# Patient Record
Sex: Female | Born: 1995 | Race: White | Hispanic: No | Marital: Single | Smoking: Never smoker
Health system: Southern US, Community
[De-identification: ages and names within clinical notes are randomized; demographics above are authoritative.]

## PROBLEM LIST (undated history)

## (undated) ENCOUNTER — Inpatient Hospital Stay (HOSPITAL_COMMUNITY): Payer: Self-pay

## (undated) DIAGNOSIS — S82899A Other fracture of unspecified lower leg, initial encounter for closed fracture: Secondary | ICD-10-CM

## (undated) DIAGNOSIS — E669 Obesity, unspecified: Secondary | ICD-10-CM

## (undated) DIAGNOSIS — F32A Depression, unspecified: Secondary | ICD-10-CM

## (undated) DIAGNOSIS — N83209 Unspecified ovarian cyst, unspecified side: Secondary | ICD-10-CM

## (undated) DIAGNOSIS — F329 Major depressive disorder, single episode, unspecified: Secondary | ICD-10-CM

## (undated) DIAGNOSIS — F419 Anxiety disorder, unspecified: Secondary | ICD-10-CM

## (undated) HISTORY — PX: NO PAST SURGERIES: SHX2092

---
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
## 2013-01-04 ENCOUNTER — Encounter (HOSPITAL_COMMUNITY): Payer: Self-pay | Admitting: *Deleted

## 2013-01-04 ENCOUNTER — Inpatient Hospital Stay (HOSPITAL_COMMUNITY)
Admission: AD | Admit: 2013-01-04 | Discharge: 2013-01-04 | Disposition: A | Discharge status: Home / Self Care | Payer: Medicaid Other | Source: Ambulatory Visit | Attending: Obstetrics & Gynecology | Admitting: Obstetrics & Gynecology

## 2013-01-04 ENCOUNTER — Inpatient Hospital Stay (HOSPITAL_COMMUNITY): Payer: Medicaid Other

## 2013-01-04 DIAGNOSIS — O99891 Other specified diseases and conditions complicating pregnancy: Secondary | ICD-10-CM | POA: Insufficient documentation

## 2013-01-04 DIAGNOSIS — R030 Elevated blood-pressure reading, without diagnosis of hypertension: Secondary | ICD-10-CM | POA: Insufficient documentation

## 2013-01-04 DIAGNOSIS — O209 Hemorrhage in early pregnancy, unspecified: Secondary | ICD-10-CM | POA: Insufficient documentation

## 2013-01-04 LAB — CBC
HCT: 39.7 % (ref 36.0–46.0)
Hemoglobin: 13.6 g/dL (ref 12.0–15.0)
MCH: 28 pg (ref 26.0–34.0)
MCHC: 34.3 g/dL (ref 30.0–36.0)
MCV: 81.9 fL (ref 78.0–100.0)
Platelets: 277 10*3/uL (ref 150–400)

## 2013-01-04 LAB — URINE MICROSCOPIC-ADD ON

## 2013-01-04 LAB — URINALYSIS, ROUTINE W REFLEX MICROSCOPIC
Ketones, ur: 15 mg/dL — AB
Leukocytes, UA: NEGATIVE
Nitrite: NEGATIVE
Protein, ur: NEGATIVE mg/dL
Urobilinogen, UA: 2 mg/dL — ABNORMAL HIGH (ref 0.0–1.0)

## 2013-01-04 LAB — ABO/RH: ABO/RH(D): A POS

## 2013-01-04 MED ORDER — PRENATAL PLUS 27-1 MG PO TABS: 1.0000 | ORAL_TABLET | Freq: Every day | ORAL | Status: DC

## 2013-01-04 NOTE — MAU Note (Signed)
Patient is in with c/o new onset vaginal bleeding. She is not wearing a pad or pantiliner. She states that she notices the light red bleeding after wiping. Patient denies pain. She have gotten an u/s this pregnancy.

## 2013-01-04 NOTE — MAU Provider Note (Signed)
Chief Complaint  Patient presents with  . Vaginal Bleeding    Subjective Rachel Stuart 17 y.o.  G2P1001 at [redacted]w[redacted]d by [place] at [redacted]w[redacted]d([redacted]w[redacted]d by LMP) presents with onset today of first episode of small amount pink vaginal bleeding. She has been followed here with serial visits since 12/18/2012 due to lower abdominal cramping and pain. Her serial quants have risen appropriately and she had a yolk sac on 10/1 ultrasound at 5 weeks 1 day. Still has menstrual-like crampy lower abdominal pain. Last intercourse 2 days ago.  Denies abnormal vaginal discharge or irritation. No dysuria or hematuria.  Blood type:   Problem list, past medical history, Ob/Gyn history, surgical history, family history and social history reviewed and updated as appropriate. Pertinent Medical History: [place]. No hx CHTN Pertinent Ob/Gyn History: NSVD x1 Pertinent Surgical History: none  Prescriptions prior to admission  Medication Sig Dispense Refill  . cyclobenzaprine (FLEXERIL) 10 MG tablet Take 1 tablet (10 mg total) by mouth every 8 (eight) hours as needed for muscle spasms.  30 tablet  1    Allergies  Allergen Reactions  . Amoxicillin Swelling    Lips swell     Objective   Filed Vitals:   01/04/13 1252  BP: 141/73  Pulse: 72  Temp: 98.6 F (37 C)  Resp: 18     Physical Exam General: Obese female in NAD  Abdom: soft, NT External genitalia: normal; BUS neg  Bimanual: Cervix closed, long; small amount red-brown vaginal blood; uterus anteverted, NT, 6 weeks size; adnexa nontender, no masses   Lab Results Results for orders placed during the hospital encounter of 01/04/13 (from the past 24 hour(s))  URINALYSIS, ROUTINE W REFLEX MICROSCOPIC     Status: Abnormal   Collection Time    01/04/13 12:55 PM      Result Value Range   Color, Urine AMBER (*) YELLOW   APPearance HAZY (*) CLEAR   Specific Gravity, Urine >1.030 (*) 1.005 - 1.030   pH 6.0  5.0 - 8.0   Glucose, UA NEGATIVE  NEGATIVE mg/dL   Hgb urine  dipstick LARGE (*) NEGATIVE   Bilirubin Urine SMALL (*) NEGATIVE   Ketones, ur 15 (*) NEGATIVE mg/dL   Protein, ur NEGATIVE  NEGATIVE mg/dL   Urobilinogen, UA 2.0 (*) 0.0 - 1.0 mg/dL   Nitrite NEGATIVE  NEGATIVE   Leukocytes, UA NEGATIVE  NEGATIVE  URINE MICROSCOPIC-ADD ON     Status: Abnormal   Collection Time    01/04/13 12:55 PM      Result Value Range   Squamous Epithelial / LPF FEW (*) RARE   WBC, UA 0-2  <3 WBC/hpf   RBC / HPF 21-50  <3 RBC/hpf   Bacteria, UA MANY (*) RARE  CBC     Status: None   Collection Time    01/04/13  2:04 PM      Result Value Range   WBC 7.0  4.0 - 10.5 K/uL   RBC 4.85  3.87 - 5.11 MIL/uL   Hemoglobin 13.6  12.0 - 15.0 g/dL   HCT 45.4  09.8 - 11.9 %   MCV 81.9  78.0 - 100.0 fL   MCH 28.0  26.0 - 34.0 pg   MCHC 34.3  30.0 - 36.0 g/dL   RDW 14.7  82.9 - 56.2 %   Platelets 277  150 - 400 K/uL  HCG, QUANTITATIVE, PREGNANCY     Status: Abnormal   Collection Time    01/04/13  2:04 PM  Result Value Range   hCG, Beta Chain, Quant, S 1773 (*) <5 mIU/mL     Ultrasound US Ob Transvaginal  01/04/2013   CLINICAL DATA:  Vaginal bleeding. For viability. Gestational age by LMP is 7 weeks 5 days  EXAM: TRANSVAGINAL OB ULTRASOUND  TECHNIQUE: Transvaginal ultrasound was performed for complete evaluation of the gestation as well as the maternal uterus, adnexal regions, and pelvic cul-de-sac.  COMPARISON:  Ultrasound pelvis 12/29/2012; 12/18/2012  FINDINGS: Intrauterine gestational sac: Visualized/normal in shape. The gestational sac is mobile within the endometrial canal. There is a small amount of surrounding heterogeneous material, likely blood products.  Yolk sac:  Presence  Embryo:  Present  Cardiac Activity: Present  Heart Rate: Difficult to measure due to movement of the gestational sac.  CRL:  2.4 mm   5 w 6d  Maternal uterus/adnexae: The right ovary is unremarkable. Unchanged 1.2 cm right paraovarian cyst. The left ovary is unremarkable.  IMPRESSION:  Single living intrauterine gestational with a gestational age of [redacted] weeks 6 days by crown-rump length. The gestational sac is mobile within the endometrium and there is a small amount of surrounding echogenic material, likely blood products. Recommend followup ultrasound in 10-14 days.   Electronically Signed   By: Annia Belt M.D.   On: 01/04/2013 15:38     Assessment 1. Bleeding in early pregnancy   2. Blood pressure elevated   G2P1001 IUP viable at 6w   Plan    Explained viability at present, but quant minimally increased and US showing likely blood products in endometrial cavity  Signs of SAB reviewed Discharge home on pelvic rest until clinic visit.  See AVS for pt education   Medication List    STOP taking these medications       cyclobenzaprine 10 MG tablet  Commonly known as:  FLEXERIL      TAKE these medications       prenatal vitamin w/FE, FA 27-1 MG Tabs tablet  Take 1 tablet by mouth daily.         Follow-up Information   Follow up with [facility] On 02/01/2013.   Specialty:  Obstetrics and Gynecology   Contact information:   [address] [place] [address] [phone]       POE,DEIRDRE 01/04/2013 2:43 PM

## 2013-01-05 ENCOUNTER — Inpatient Hospital Stay (HOSPITAL_COMMUNITY)
Admission: AD | Admit: 2013-01-05 | Discharge: 2013-01-05 | Disposition: A | Discharge status: Home / Self Care | Payer: Medicaid Other | Source: Ambulatory Visit | Attending: Obstetrics and Gynecology | Admitting: Obstetrics and Gynecology

## 2013-01-05 ENCOUNTER — Inpatient Hospital Stay (HOSPITAL_COMMUNITY): Payer: Medicaid Other

## 2013-01-05 ENCOUNTER — Encounter (HOSPITAL_COMMUNITY): Payer: Self-pay | Admitting: *Deleted

## 2013-01-05 DIAGNOSIS — O039 Complete or unspecified spontaneous abortion without complication: Secondary | ICD-10-CM

## 2013-01-05 LAB — CBC
HCT: 37.7 % (ref 36.0–46.0)
Hemoglobin: 12.9 g/dL (ref 12.0–15.0)
MCHC: 34.2 g/dL (ref 30.0–36.0)
RDW: 13.2 % (ref 11.5–15.5)

## 2013-01-05 MED ORDER — IBUPROFEN 800 MG PO TABS: 800.0000 mg | ORAL_TABLET | Freq: Three times a day (TID) | ORAL | Status: DC

## 2013-01-05 NOTE — MAU Provider Note (Addendum)
Chief Complaint: Vaginal Bleeding  First Provider Initiated Contact with Patient 01/05/13 0747.     SUBJECTIVE HPI: Rachel Stuart is a 17 y.o. G2P1001 at [redacted]w[redacted]d by [place] who presents with moderate to heavy vaginal bleeding and possibly passing a clot this morning. Unsure if it was a GS. Was seen in MAU 5 times in past month for pregnancy issues. Live 5.6 week IUP seen in [place] yesterday. GC/CT, Wet prep neg 12/18/2012. "The gestational sac is mobile within the endometrium and there is a small amount of surrounding echogenic material, likely blood products."  Past Medical History  Diagnosis Date  . Ovarian cyst   . Depression   . Obesity   . Ankle fracture    OB History  Gravida Para Term Preterm AB SAB TAB Ectopic Multiple Living  2 1 1       1     # Outcome Date GA Lbr Len/2nd Weight Sex Delivery Anes PTL Lv  2 CUR           1 TRM 02/26/10    F SVD None       Past Surgical History  Procedure Laterality Date  . No past surgeries     History   Social History  . Marital Status: Single    Spouse Name: N/A    Number of Children: N/A  . Years of Education: N/A   Occupational History  . Not on file.   Social History Main Topics  . Smoking status: Never Smoker   . Smokeless tobacco: Not on file  . Alcohol Use: No  . Drug Use: No  . Sexual Activity: Yes    Birth Control/ Protection: None   Other Topics Concern  . Not on file   Social History Narrative  . No narrative on file   No current facility-administered medications on file prior to encounter.   No current outpatient prescriptions on file prior to encounter.   Allergies  Allergen Reactions  . Amoxicillin Swelling    Lips swell    ROS: Pertinent items in HPI. Neg for fever, chill, abd pain.   OBJECTIVE Blood pressure 118/73, pulse 79, temperature 98.6 F (37 C), temperature source Oral, resp. rate 18, height 5\' 3"  (1.6 m), weight 238 lb 3.2 oz (108.047 kg), last menstrual period 11/11/2012. GENERAL:  Well-developed, well-nourished female in no acute distress.  HEENT: Normocephalic HEART: normal rate RESP: normal effort ABDOMEN: Soft, non-tender EXTREMITIES: Nontender, no edema NEURO: Alert and oriented SPECULUM EXAM: NEFG, small amount of dark red blood noted, cervix clean. Possible bartholin's vs vaginal wall cyst visualized and palpated on left vaginal wall at introitus. NT. No erythema or drainage.  BIMANUAL: cervix FT dilated; uterus normal size, no adnexal tenderness or masses. No CMT.   MAU COURSE [place], CBC ordered. Care of pt turned over to Jannifer Rodney, NP at 0815 am.   Carolynn Serve, NP 01/05/2013  11:00 AM    Jannifer Rodney, NP  Results for orders placed during the hospital encounter of 01/05/13 (from the past 24 hour(s))  CBC     Status: None   Collection Time    01/05/13  8:15 AM      Result Value Range   WBC 7.1  4.0 - 10.5 K/uL   RBC 4.55  3.87 - 5.11 MIL/uL   Hemoglobin 12.9  12.0 - 15.0 g/dL   HCT 19.1  47.8 - 29.5 %   MCV 82.9  78.0 - 100.0 fL   MCH 28.4  26.0 -  34.0 pg   MCHC 34.2  30.0 - 36.0 g/dL   RDW 81.1  91.4 - 78.2 %   Platelets 277  150 - 400 K/uL   Beta HCG= 1773 Blood type A positive     US Ob Transvaginal  01/05/2013   CLINICAL DATA:  Vaginal bleeding  EXAM: TRANSVAGINAL OB ULTRASOUND  TECHNIQUE: Transvaginal ultrasound was performed for complete evaluation of the gestation as well as the maternal uterus, adnexal regions, and pelvic cul-de-sac.  COMPARISON:  01/04/2013  FINDINGS: Intrauterine gestational sac: No longer visualized. Endometrial thickness 1.5 cm maximally at the lower uterine segment endometrial canal. Area not evaluated with color Doppler by the technologist.  Yolk sac:  No longer identified  Embryo:  Not identified  Maternal uterus/adnexae: Right ovary not visualized. Normal left ovary.  IMPRESSION: Apparent interval missed abortion. Mildly prominent endometrium measuring 1.5 cm with internal debris or clot.    Electronically Signed   By: Christiana Pellant M.D.   On: 01/05/2013 10:31   US Ob Transvaginal  01/04/2013   CLINICAL DATA:  Vaginal bleeding. For viability. Gestational age by LMP is 7 weeks 5 days  EXAM: TRANSVAGINAL OB ULTRASOUND  TECHNIQUE: Transvaginal ultrasound was performed for complete evaluation of the gestation as well as the maternal uterus, adnexal regions, and pelvic cul-de-sac.  COMPARISON:  Ultrasound pelvis 12/29/2012; 12/18/2012  FINDINGS: Intrauterine gestational sac: Visualized/normal in shape. The gestational sac is mobile within the endometrial canal. There is a small amount of surrounding heterogeneous material, likely blood products.  Yolk sac:  Presence  Embryo:  Present  Cardiac Activity: Present  Heart Rate: Difficult to measure due to movement of the gestational sac.  CRL:  2.4 mm   5 w 6d  Maternal uterus/adnexae: The right ovary is unremarkable. Unchanged 1.2 cm right paraovarian cyst. The left ovary is unremarkable.  IMPRESSION: Single living intrauterine gestational with a gestational age of [redacted] weeks 6 days by crown-rump length. The gestational sac is mobile within the endometrium and there is a small amount of surrounding echogenic material, likely blood products. Recommend followup ultrasound in 10-14 days.   Electronically Signed   By: Annia Belt M.D.   On: 01/04/2013 15:38   A: SAB P: Miscarriage procedures to include nothing in vagina. Advised to not become pregnant for 6-12 months Motrin 800 mg TID prn pain Return in 48 hours for Quant

## 2013-01-05 NOTE — MAU Provider Note (Signed)
Attestation of Attending Supervision of Advanced Practitioner (CNM/NP): Evaluation and management procedures were performed by the Advanced Practitioner under my supervision and collaboration.  I have reviewed the Advanced Practitioner's note and chart, and I agree with the management and plan.  [name] 01/05/2013 4:04 PM   

## 2013-01-05 NOTE — MAU Provider Note (Signed)
Attestation of Attending Supervision of Advanced Practitioner (CNM/NP): Evaluation and management procedures were performed by the Advanced Practitioner under my supervision and collaboration.  I have reviewed the Advanced Practitioner's note and chart, and I agree with the management and plan.  Cordaryl Decelles 01/05/2013 9:59 AM

## 2013-01-05 NOTE — MAU Note (Signed)
Pt presents with complaints of going to the bathroom this morning and she noticed some bleeding and she says that she is still bleeding like a period

## 2013-01-30 ENCOUNTER — Emergency Department (HOSPITAL_COMMUNITY): Payer: Medicaid Other

## 2013-01-30 ENCOUNTER — Encounter (HOSPITAL_COMMUNITY): Payer: Self-pay | Admitting: Emergency Medicine

## 2013-01-30 ENCOUNTER — Emergency Department (HOSPITAL_COMMUNITY)
Admission: EM | Admit: 2013-01-30 | Discharge: 2013-01-30 | Disposition: A | Discharge status: Home / Self Care | Payer: Medicaid Other | Attending: Emergency Medicine | Admitting: Emergency Medicine

## 2013-01-30 DIAGNOSIS — R0789 Other chest pain: Secondary | ICD-10-CM | POA: Insufficient documentation

## 2013-01-30 DIAGNOSIS — Z8659 Personal history of other mental and behavioral disorders: Secondary | ICD-10-CM | POA: Insufficient documentation

## 2013-01-30 DIAGNOSIS — E669 Obesity, unspecified: Secondary | ICD-10-CM | POA: Insufficient documentation

## 2013-01-30 DIAGNOSIS — Z8742 Personal history of other diseases of the female genital tract: Secondary | ICD-10-CM | POA: Insufficient documentation

## 2013-01-30 DIAGNOSIS — Z8781 Personal history of (healed) traumatic fracture: Secondary | ICD-10-CM | POA: Insufficient documentation

## 2013-01-30 LAB — PRO B NATRIURETIC PEPTIDE: Pro B Natriuretic peptide (BNP): <5 pg/mL (ref 0–125)

## 2013-01-30 LAB — BASIC METABOLIC PANEL
BUN: 11 mg/dL (ref 6–23)
Calcium: 9.5 mg/dL (ref 8.4–10.5)
Chloride: 102 mEq/L (ref 96–112)
Creatinine, Ser: 0.61 mg/dL (ref 0.50–1.10)
GFR calc Af Amer: >90 mL/min (ref >90)
GFR calc non Af Amer: >90 mL/min (ref >90)
Glucose, Bld: 96 mg/dL (ref 70–99)

## 2013-01-30 LAB — D-DIMER, QUANTITATIVE: D-Dimer, Quant: 0.34 ug/mL-FEU (ref 0.00–0.48)

## 2013-01-30 LAB — CBC
HCT: 39.7 % (ref 36.0–46.0)
MCH: 29 pg (ref 26.0–34.0)
MCHC: 35.5 g/dL (ref 30.0–36.0)
MCV: 81.5 fL (ref 78.0–100.0)
Platelets: 347 10*3/uL (ref 150–400)
RDW: 12.6 % (ref 11.5–15.5)

## 2013-01-30 MED ORDER — KETOROLAC TROMETHAMINE 60 MG/2ML IM SOLN
60.0000 mg | Freq: Once | INTRAMUSCULAR | Status: AC
  Administered 2013-01-30: 60 mg via INTRAMUSCULAR
  Filled 2013-01-30: qty 2

## 2013-01-30 MED ORDER — IBUPROFEN 600 MG PO TABS: 600.0000 mg | ORAL_TABLET | Freq: Four times a day (QID) | ORAL | Status: DC | PRN

## 2013-01-30 NOTE — ED Notes (Signed)
Dr. Docherty in to see patient. 

## 2013-01-30 NOTE — ED Notes (Signed)
Pt c/o mid chest pain and left sided ribcage pain since last night and states she feels like she can not get a breath. Pain exacerbated by nothing, pain relived by nothing. Denies n/v, diaphoresis. Denies cough fever chills, and body aches.

## 2013-01-30 NOTE — ED Notes (Signed)
Dr. Micheline Maze at bedside to assess patient.

## 2013-01-30 NOTE — ED Provider Notes (Signed)
CSN: [id number]     Arrival date & time 01/30/13  1958 History   First MD Initiated Contact with Patient 01/30/13 2138     Chief Complaint  Patient presents with  . Chest Pain   (Consider location/radiation/quality/duration/timing/severity/associated sxs/prior Treatment) Patient is a 17 y.o. female presenting with chest pain. The history is provided by the patient. No language interpreter was used.  Chest Pain Pain location:  L chest Pain quality: sharp   Pain radiates to:  L arm Pain radiates to the back: no   Pain severity:  Moderate Onset quality:  Sudden Duration:  1 day Timing:  Constant Progression:  Unchanged Chronicity:  New Context: at rest   Relieved by:  Nothing Worsened by:  Deep breathing Ineffective treatments:  None tried Associated symptoms: no abdominal pain, no anxiety, no back pain, no cough, no diaphoresis, no dizziness, no fatigue, no fever, no headache, no lower extremity edema, no nausea, no numbness, no palpitations, no shortness of breath, not vomiting and no weakness   Risk factors: obesity   Risk factors: no aortic disease, no birth control, no coronary artery disease, no diabetes mellitus, no high cholesterol, no hypertension, no immobilization, not female, no prior DVT/PE, no smoking and no surgery     Past Medical History  Diagnosis Date  . Ovarian cyst   . Depression   . Obesity   . Ankle fracture    Past Surgical History  Procedure Laterality Date  . No past surgeries     Family History  Problem Relation Age of Onset  . Hypertension Maternal Grandmother   . Diabetes Paternal Grandfather    History  Substance Use Topics  . Smoking status: Never Smoker   . Smokeless tobacco: Not on file  . Alcohol Use: No   OB History   Grav Para Term Preterm Abortions TAB SAB Ect Mult Living   2 1 1       1      Review of Systems  Constitutional: Negative for fever, chills, diaphoresis, activity change, appetite change and fatigue.  HENT: Negative  for congestion, facial swelling, rhinorrhea and sore throat.   Eyes: Negative for photophobia and discharge.  Respiratory: Negative for cough, chest tightness and shortness of breath.   Cardiovascular: Positive for chest pain. Negative for palpitations and leg swelling.  Gastrointestinal: Negative for nausea, vomiting, abdominal pain and diarrhea.  Endocrine: Negative for polydipsia and polyuria.  Genitourinary: Negative for dysuria, frequency, difficulty urinating and pelvic pain.  Musculoskeletal: Negative for arthralgias, back pain, neck pain and neck stiffness.  Skin: Negative for color change and wound.  Allergic/Immunologic: Negative for immunocompromised state.  Neurological: Negative for dizziness, facial asymmetry, weakness, numbness and headaches.  Hematological: Does not bruise/bleed easily.  Psychiatric/Behavioral: Negative for confusion and agitation.    Allergies  Amoxicillin  Home Medications   Current Outpatient Rx  Name  Route  Sig  Dispense  Refill  . ibuprofen (ADVIL,MOTRIN) 800 MG tablet   Oral   Take 1 tablet (800 mg total) by mouth 3 (three) times daily.   30 tablet   0    BP 131/72  Pulse 83  Temp(Src) 99.3 F (37.4 C) (Oral)  Resp 16  Wt 232 lb 3.2 oz (105.325 kg)  SpO2 100%  LMP 01/05/2013  Breastfeeding? Unknown Physical Exam  Constitutional: She is oriented to person, place, and time. She appears well-developed and well-nourished. No distress.  HENT:  Head: Normocephalic and atraumatic.  Mouth/Throat: No oropharyngeal exudate.  Eyes: Pupils are  equal, round, and reactive to light.  Neck: Normal range of motion. Neck supple.  Cardiovascular: Normal rate, regular rhythm and normal heart sounds.  Exam reveals no gallop and no friction rub.   No murmur heard. Pulmonary/Chest: Effort normal and breath sounds normal. No respiratory distress. She has no wheezes. She has no rales.    Abdominal: Soft. Bowel sounds are normal. She exhibits no  distension and no mass. There is no tenderness. There is no rebound and no guarding.  Musculoskeletal: Normal range of motion. She exhibits no edema and no tenderness.  Neurological: She is alert and oriented to person, place, and time.  Skin: Skin is warm and dry.  Psychiatric: She has a normal mood and affect.    ED Course  Procedures (including critical care time) Labs Review Labs Reviewed  CBC  PRO B NATRIURETIC PEPTIDE  BASIC METABOLIC PANEL  D-DIMER, QUANTITATIVE  POCT I-STAT TROPONIN I   Imaging Review No results found.  EKG Interpretation   None       MDM  No diagnosis found. Pt is a 17 y.o. female with Pmhx as above who presents with, sharp, L sided CP for about 24 hours.  Worse w/ deep inspirations, otherwise no aggravating ro alleviating symptoms.  PERC negative.  +reproducible CP.  Cardiopulm exam otherwise benign. No LE edema. EKG nml, trop negative. D-dimer not elevated. CXR unremarkable.  Doubt ACS, pna, PE, ptx.  Pain most likely musculoskeletal.  Will d/c home w/ trial of NSAIDs.  Return precautions given for new or worsening symptoms including worsening pain, fever, SOB, leg swelling.           Shanna Cisco, MD 01/31/13 [phone]

## 2013-02-01 ENCOUNTER — Encounter: Payer: Medicaid Other | Admitting: Family Medicine

## 2013-04-15 ENCOUNTER — Encounter (HOSPITAL_COMMUNITY): Payer: Self-pay | Admitting: Emergency Medicine

## 2013-04-15 ENCOUNTER — Emergency Department (HOSPITAL_COMMUNITY)
Admission: EM | Admit: 2013-04-15 | Discharge: 2013-04-16 | Discharge status: Against Medical Advice | Payer: Medicaid Other | Attending: Emergency Medicine | Admitting: Emergency Medicine

## 2013-04-15 DIAGNOSIS — M79609 Pain in unspecified limb: Secondary | ICD-10-CM | POA: Insufficient documentation

## 2013-04-15 NOTE — ED Notes (Signed)
No answer in w/r, unable to find.  

## 2013-04-15 NOTE — ED Notes (Signed)
The pt has bed busg at her house and was spraying for them when she sprayed her rt hand that has cuts on it.  She has pain and sl swelling now

## 2013-04-15 NOTE — ED Notes (Signed)
Unable to locate pt in triage waiting area.

## 2013-04-16 NOTE — ED Notes (Signed)
No answer x3

## 2014-01-30 ENCOUNTER — Encounter (HOSPITAL_COMMUNITY): Payer: Self-pay | Admitting: Emergency Medicine

## 2014-03-31 NOTE — L&D Delivery Note (Cosign Needed)
Delivery Note Pt received Pitocin after getting an epidural, and she progressed to complete dilation. After pushing w/ approx 5 ctx at 2:15 AM a viable female was delivered via Vaginal, Spontaneous Delivery (Presentation: Right Occiput Anterior).  APGAR: 9, 9; weight  .   Placenta status: Intact, Spontaneous.  Cord: 3 vessels   Anesthesia: Epidural  Episiotomy: None Lacerations: None Est. Blood Loss (mL): 303  Mom to postpartum.  Baby to Couplet care / Skin to Skin.  Cam HaiSHAW, KIMBERLY CNM 02/15/2015, 2:39 AM

## 2014-04-22 ENCOUNTER — Encounter (HOSPITAL_COMMUNITY): Payer: Self-pay | Admitting: *Deleted

## 2014-04-22 ENCOUNTER — Emergency Department (HOSPITAL_COMMUNITY)
Admission: EM | Admit: 2014-04-22 | Discharge: 2014-04-22 | Disposition: A | Discharge status: Home / Self Care | Payer: Medicaid Other | Attending: Emergency Medicine | Admitting: Emergency Medicine

## 2014-04-22 ENCOUNTER — Emergency Department (HOSPITAL_COMMUNITY): Payer: Medicaid Other

## 2014-04-22 DIAGNOSIS — Y9389 Activity, other specified: Secondary | ICD-10-CM | POA: Diagnosis not present

## 2014-04-22 DIAGNOSIS — Z880 Allergy status to penicillin: Secondary | ICD-10-CM | POA: Diagnosis not present

## 2014-04-22 DIAGNOSIS — Z8781 Personal history of (healed) traumatic fracture: Secondary | ICD-10-CM | POA: Insufficient documentation

## 2014-04-22 DIAGNOSIS — E669 Obesity, unspecified: Secondary | ICD-10-CM | POA: Insufficient documentation

## 2014-04-22 DIAGNOSIS — M79645 Pain in left finger(s): Secondary | ICD-10-CM

## 2014-04-22 DIAGNOSIS — W1839XA Other fall on same level, initial encounter: Secondary | ICD-10-CM | POA: Insufficient documentation

## 2014-04-22 DIAGNOSIS — Y9289 Other specified places as the place of occurrence of the external cause: Secondary | ICD-10-CM | POA: Diagnosis not present

## 2014-04-22 DIAGNOSIS — Z87448 Personal history of other diseases of urinary system: Secondary | ICD-10-CM | POA: Insufficient documentation

## 2014-04-22 DIAGNOSIS — Z8659 Personal history of other mental and behavioral disorders: Secondary | ICD-10-CM | POA: Insufficient documentation

## 2014-04-22 DIAGNOSIS — S6992XA Unspecified injury of left wrist, hand and finger(s), initial encounter: Secondary | ICD-10-CM | POA: Insufficient documentation

## 2014-04-22 DIAGNOSIS — Y998 Other external cause status: Secondary | ICD-10-CM | POA: Insufficient documentation

## 2014-04-22 NOTE — ED Notes (Signed)
Velcro Thumb Spica splint applied to left hand/wrist. Pt able to wiggle fingers, palpable radial pulse, cap refill less than 3 sec. States is comfortable.

## 2014-04-22 NOTE — ED Provider Notes (Signed)
CSN: [id number]     Arrival date & time 04/22/14  1221 History  This chart was scribed for non-physician practitioner, Santiago Glad, PA-C working with Flint Melter, MD by Luisa Dago, ED scribe. This patient was seen in room TR09C/TR09C and the patient's care was started at 12:42 PM.    Chief Complaint  Patient presents with  . Finger Injury    LT thumb   The history is provided by medical records and the patient. No language interpreter was used.   HPI Comments: Rachel Stuart is a 19 y.o. female who presents to the Emergency Department complaining of of a left thumb injury that occurred yesterday while playing with her child.  She states that her thumb bent backwards while playing on the floor. Pain is made worse by the movement of the finger. Positive paraesthesia to the left thumb, which she states is gradually moving down her hand. Reports taking 800 mg Ibuprofen with mild relief. Reports mild associated swelling of the thumb.  Denies pain of the wrist or any other fingers.   Past Medical History  Diagnosis Date  . Ovarian cyst   . Depression   . Obesity   . Ankle fracture    Past Surgical History  Procedure Laterality Date  . No past surgeries     Family History  Problem Relation Age of Onset  . Hypertension Maternal Grandmother   . Diabetes Paternal Grandfather    History  Substance Use Topics  . Smoking status: Never Smoker   . Smokeless tobacco: Not on file  . Alcohol Use: No   OB History    Gravida Para Term Preterm AB TAB SAB Ectopic Multiple Living   Review of Systems  Constitutional: Negative for fever.  Musculoskeletal: Positive for joint swelling and arthralgias. Negative for myalgias.  Neurological: Negative for weakness and numbness.   Allergies  Amoxicillin  Home Medications   Prior to Admission medications   Not on File   Triage vitals: BP 122/74 mmHg  Temp(Src) 98 F (36.7 C) (Oral)  Resp 18  SpO2 100%  LMP  03/31/2014  Physical Exam  Constitutional: She is oriented to person, place, and time. She appears well-developed and well-nourished. No distress.  HENT:  Head: Normocephalic and atraumatic.  Eyes: Conjunctivae and EOM are normal.  Neck: Normal range of motion. Neck supple. No tracheal deviation present.  Cardiovascular: Normal rate and regular rhythm.  Exam reveals no gallop and no friction rub.   No murmur heard. Pulses:      Radial pulses are 2+ on the left side.  Pulmonary/Chest: Effort normal and breath sounds normal. No respiratory distress. She has no wheezes. She has no rales. She exhibits no tenderness.  Musculoskeletal: Normal range of motion.  Tenderness to palpation of the left thumb MCP and the proximal left thumb phalange.  ROM of the left thumb limited secondary to pain. No erythema, edema, or bruising noted. No snuff box tenderness. Full ROM of the left wrist without pain.  No bony tenderness of the left wrist.   Neurological: She is alert and oriented to person, place, and time.  Distal sensation of left thumb is intact  Skin: Skin is warm and dry.  Psychiatric: She has a normal mood and affect. Her behavior is normal.  Nursing note and vitals reviewed.   ED Course  Procedures (including critical care time)  DIAGNOSTIC STUDIES: Oxygen Saturation is 100% on  RA, normal by my interpretation.    COORDINATION OF CARE: 12:49 PM- Pt advised of plan for treatment and pt agrees.  Labs Review Labs Reviewed - No data to display  Imaging Review Dg Hand Complete Left  04/22/2014   CLINICAL DATA:  Thumb pain and swelling following injury yesterday. Initial encounter.  EXAM: LEFT HAND - COMPLETE 3+ VIEW  COMPARISON:  None.  FINDINGS: The mineralization and alignment are normal. There is no evidence of acute fracture or dislocation. The joint spaces are maintained. There is no focal soft tissue swelling.  IMPRESSION: No acute osseous findings.   Electronically Signed   By: Roxy HorsemanBill   Veazey M.D.   On: 04/22/2014 13:21     EKG Interpretation None      MDM   Final diagnoses:  None   Patient presents today with hand of her thumb after bending it backwards while playing with her child this morning.  Xray negative.  Neurovascularly intact.  Patient given velcro thumb spica.  Stable for discharge.  Return precautions given.   Santiago GladHeather Zissy Hamlett, PA-C 04/22/14 1545  Flint MelterElliott L Wentz, MD 04/22/14 [phone]

## 2014-04-22 NOTE — ED Notes (Signed)
Pt reports falling on LT thumb yesterday while playing with child, Pt now reports pain to LT thumb that radiates int forearm.

## 2014-06-05 ENCOUNTER — Emergency Department (HOSPITAL_COMMUNITY)
Admission: EM | Admit: 2014-06-05 | Discharge: 2014-06-05 | Disposition: A | Discharge status: Home / Self Care | Payer: Medicaid Other | Attending: Emergency Medicine | Admitting: Emergency Medicine

## 2014-06-05 ENCOUNTER — Encounter (HOSPITAL_COMMUNITY): Payer: Self-pay | Admitting: Emergency Medicine

## 2014-06-05 DIAGNOSIS — Z880 Allergy status to penicillin: Secondary | ICD-10-CM | POA: Diagnosis not present

## 2014-06-05 DIAGNOSIS — E669 Obesity, unspecified: Secondary | ICD-10-CM | POA: Diagnosis not present

## 2014-06-05 DIAGNOSIS — Z8781 Personal history of (healed) traumatic fracture: Secondary | ICD-10-CM | POA: Insufficient documentation

## 2014-06-05 DIAGNOSIS — Z8659 Personal history of other mental and behavioral disorders: Secondary | ICD-10-CM | POA: Insufficient documentation

## 2014-06-05 DIAGNOSIS — Z8742 Personal history of other diseases of the female genital tract: Secondary | ICD-10-CM | POA: Diagnosis not present

## 2014-06-05 DIAGNOSIS — K029 Dental caries, unspecified: Secondary | ICD-10-CM | POA: Diagnosis not present

## 2014-06-05 DIAGNOSIS — K088 Other specified disorders of teeth and supporting structures: Secondary | ICD-10-CM | POA: Insufficient documentation

## 2014-06-05 DIAGNOSIS — K0889 Other specified disorders of teeth and supporting structures: Secondary | ICD-10-CM

## 2014-06-05 MED ORDER — IBUPROFEN 800 MG PO TABS: 800.0000 mg | ORAL_TABLET | Freq: Three times a day (TID) | ORAL | Status: DC

## 2014-06-05 MED ORDER — CEPHALEXIN 500 MG PO CAPS: 500.0000 mg | ORAL_CAPSULE | Freq: Four times a day (QID) | ORAL | Status: DC

## 2014-06-05 NOTE — ED Notes (Signed)
Pt c/o left sided dental pain x several days up into ear

## 2014-06-05 NOTE — Discharge Instructions (Signed)
Dental Pain °A tooth ache may be caused by cavities (tooth decay). Cavities expose the nerve of the tooth to air and hot or cold temperatures. It may come from an infection or abscess (also called a boil or furuncle) around your tooth. It is also often caused by dental caries (tooth decay). This causes the pain you are having. °DIAGNOSIS  °Your caregiver can diagnose this problem by exam. °TREATMENT  °· If caused by an infection, it may be treated with medications which kill germs (antibiotics) and pain medications as prescribed by your caregiver. Take medications as directed. °· Only take over-the-counter or prescription medicines for pain, discomfort, or fever as directed by your caregiver. °· Whether the tooth ache today is caused by infection or dental disease, you should see your dentist as soon as possible for further care. °SEEK MEDICAL CARE IF: °The exam and treatment you received today has been provided on an emergency basis only. This is not a substitute for complete medical or dental care. If your problem worsens or new problems (symptoms) appear, and you are unable to meet with your dentist, call or return to this location. °SEEK IMMEDIATE MEDICAL CARE IF:  °· You have a fever. °· You develop redness and swelling of your face, jaw, or neck. °· You are unable to open your mouth. °· You have severe pain uncontrolled by pain medicine. °MAKE SURE YOU:  °· Understand these instructions. °· Will watch your condition. °· Will get help right away if you are not doing well or get worse. °Document Released: 03/17/2005 Document Revised: 06/09/2011 Document Reviewed: 11/03/2007 °ExitCare® Patient Information ©2015 [employer]. This information is not intended to replace advice given to you by your health care provider. Make sure you discuss any questions you have with your health care provider. ° °Emergency Department Resource Guide °1) Find a Doctor and Pay Out of Pocket °Although you won't have to find out who  is covered by your insurance plan, it is a good idea to ask around and get recommendations. You will then need to call the office and see if the doctor you have chosen will accept you as a new patient and what types of options they offer for patients who are self-pay. Some doctors offer discounts or will set up payment plans for their patients who do not have insurance, but you will need to ask so you aren't surprised when you get to your appointment. ° °2) Contact Your Local Health Department °Not all health departments have doctors that can see patients for sick visits, but many do, so it is worth a call to see if yours does. If you don't know where your local health department is, you can check in your phone book. The CDC also has a tool to help you locate your state's health department, and many state websites also have listings of all of their local health departments. ° °3) Find a Walk-in Clinic °If your illness is not likely to be very severe or complicated, you may want to try a walk in clinic. These are popping up all over the country in pharmacies, drugstores, and shopping centers. They're usually staffed by nurse practitioners or physician assistants that have been trained to treat common illnesses and complaints. They're usually fairly quick and inexpensive. However, if you have serious medical issues or chronic medical problems, these are probably not your best option. ° °No Primary Care Doctor: °- Call [facility] at  832-8000 - they can help you locate a primary   care doctor that  accepts your insurance, provides certain services, etc. °- Physician Referral Service- 1-800-533-3463 ° °Chronic Pain Problems: °Organization         Address  Phone   Notes  °[facility]  (336) 297-2271 Patients need to be referred by their primary care doctor.  ° °Medication Assistance: °Organization         Address  Phone   Notes  °[place] [facility] [address]  [address] °[place], [place] [address] (336) 641-8030 --Must be a resident of [place] °-- Must have NO insurance coverage whatsoever (no Medicaid/ Medicare, etc.) °-- The pt. MUST have a primary care doctor that directs their care regularly and follows them in the community °  °[facility]  (866) 331-1348   °[employer]  (888) 892-1162   ° °Agencies that provide inexpensive medical care: °Organization         Address  Phone   Notes  °[facility]  (336) 832-8035   °[facility]    (336) 832-7272   °[facility] [address] °[place], [place] [address] (336) 832-4777   °[facility] [address], °[place] (336) 271-4999   °[facility]    (336) 373-0678   °[facility]    (336) 272-1050   °[facility] ° [address], [place] Phone:  (336) 832-4444, Fax:  (336) 832-4440 Hours of Operation:  9 am - 6 pm, M-F.  Also accepts Medicaid/Medicare and self-pay.  °[facility] ° [address], [place] Phone: (336) 832-3150, Fax: (336) 832-3151. Hours of Operation:  8:30 am - 5:30 pm, M-F.  Also accepts Medicaid and self-pay.  °[facility] [place] [address], [place] Phone: (336) 878-6027   °[facility] [address], [place], [place] (336)723-1848, Ext. 123 Mondays & Thursdays: 7-9 AM.  First 15 patients are seen on a first come, first serve basis. °  ° °Medicaid-accepting [place] Providers: ° °Organization         Address  Phone   Notes  °[facility] [address], [place] (336) 641-2100 Also accepts self-pay patients.  °[facility] [address], [place] ° (336) 856-9996   °[facility] [address], [place] (336) 288-8857   °[facility] [address] [place] [address], [place] (336) 299-7000   °Veita Bland [address], [place]  ° (336) 373-1557 Only accepts [place] Access Medicaid patients after they have their name applied to their card.  ° °Self-Pay (no insurance) in [place]: ° °Organization         Address  Phone   Notes  °Sickle Cell Patients, [facility] [address], [place] (336) 832-1970   °[facility] [address], [place] (336) 832-4400   °[facility] ° [address], [place] (336) 992-4800   °[facility]/Dr. Osei-Bonsu ° [address] [place] [address], [place] or [address], [place] (336) 841-8500 Phone number for both [place] and [place] locations is the same.  °[facility] [address], [place] (336) 299-0000   °[facility] [address] [place] [address],  or [address] (336) 852-7530 °(336) 878-2260   °  [facility] [address], [place] (336) 350-1642, phone; (336) 294-5005, fax Sees patients 1st and 3rd Saturday of every month.  Must not qualify for public or private insurance (i.e. Medicaid, Medicare, [place] Health Choice, Veterans' Benefits) • Household income should be no more than 200% of the poverty level •The clinic cannot treat you if you are pregnant or think you are pregnant • Sexually transmitted diseases are not treated at the clinic.  ° ° °Dental Care: °Organization         Address  Phone  Notes  °[facility] [address], [place] (336) 641-6152 Accepts children up to age 21 who are enrolled in Medicaid or [place] Health Choice; pregnant women with a Medicaid card; and children who have applied for Medicaid or [place] Health Choice, but were declined, whose parents can pay a reduced fee at time of service.  °[facility] [place]  [address], [place] (336) 641-7733 Accepts children up to age 21 who are  enrolled in Medicaid or [place] Health Choice; pregnant women with a Medicaid card; and children who have applied for Medicaid or [place] Health Choice, but were declined, whose parents can pay a reduced fee at time of service.  °[facility] ° [address], [place] (336) 641-4533 Patients are seen by appointment only. Walk-ins are not accepted. [facility] will see patients 18 years of age and older. °Monday - Tuesday (8am-5pm) °Most Wednesdays (8:30-5pm) °$30 per visit, cash only  °[facility] ° [address], [place] (336) 641-4533 Patients are seen by appointment only. Walk-ins are not accepted. [facility] will see patients 18 years of age and older. °One Wednesday Evening (Monthly: Volunteer Based).  $30 per visit, cash only  °[facility]  (919) 537-3737 for adults; Children under age 4, call Graduate Pediatric Dentistry at (919) [phone]. Children aged 4-14, please call (919) [phone] to request a pediatric application. ° Dental services are provided in all areas of dental care including fillings, crowns and bridges, complete and partial dentures, implants, gum treatment, root canals, and extractions. Preventive care is also provided. Treatment is provided to both adults and children. °Patients are selected via a lottery and there is often a waiting list. °  °[facility] [address], °[place] ° (336) 763-8833 [email or web address] °  °[facility] [address], [place], [place] (336)723-1848, Ext. 123 Second and Fourth Thursday of each month, opens at 6:30 AM; Clinic ends at 9 AM.  Patients are seen on a first-come first-served basis, and a limited number are seen during each clinic.  ° °[facility] ° [address], [place], [place] (336) 723-7904   Eligibility Requirements °You must have lived in [place], [place], or [place] counties for at least the last three months. °  You  cannot be eligible for state or federal sponsored healthcare insurance, including Veterans Administration, Medicaid, or Medicare. °  You generally cannot be eligible for healthcare insurance through your employer.  °  How to apply: °Eligibility screenings are held every Tuesday and Wednesday afternoon from 1:00 pm until 4:00 pm. You do not need an appointment for the interview!  °[facility] [address], [place], [place] 336-631-2330   °[facility]  336-342-8273   °[facility]  336-703-3100   °[facility]  336-570-6415   ° °Behavioral Health Resources in the Community: °Intensive Outpatient Programs °Organization         Address  Phone  Notes  °[facility] [address], [place], [place] 336-878-6098   °[facility] [address], [place], [place] 336-832-9800   °[facility] [address], [place], [place] ° 336-882-2125   °[facility] [address],  °[place], [place] 1-800-853-5163 or 336-641-4981   °Substance Abuse Resources °Organization         Address  Phone  Notes  °[facility]  336-882-2125   °[facility]  336-784-9470   °[facility]  336-285-9073   °[facility]  336-845-3988   °Residential & Outpatient Substance Abuse Program  1-800-659-3381   °Psychological Services °Organization         Address  Phone  Notes  ° Health  336- 832-9600   °[facility]  336- 378-7881   °[facility] [address], [place] 1-800-853-5163 or 336-641-4981   ° °Mobile Crisis Teams °Organization         Address  Phone  Notes  °[facility], Mobile Crisis Care Unit  1-877-626-1772   °Assertive °[facility] ° [address] [place], [place] 336-834-9664   °Sharon DeEsch [address] °[place] 336-554-5454   ° °Self-Help/Support  Groups °Organization         Address  Phone             Notes  °[facility] - variety of support groups  336- 373-1402 Call for more information  °Narcotics Anonymous (NA), [facility] [address], °[place]  2 meetings at this location  ° °Residential Treatment Programs °Organization         Address  Phone  Notes  °[facility] [address],    °[place]  1-866-801-8205   °[facility] ° [address], [place], [place] 704-293-8524   °[facility] [address], [place] 336-845-3988 Admissions: 8am-3pm M-F  °[facility] [address]    °[place], [place] 336-841-1104   °[facility] [address], [place], [place] 336-379-7146   °[facility] [address]  °[place], [place] 336-285-9073   °[facility] - Intensive Outpatient [address], [place], [place] 336-852-3033   °[facility] [address]  °[place], [place] 1-877-615-2722 or 336-784-9470   °[facility] ([facility] [address] , [place] 336-227-7417 Accepts Medicaid  °[facility] [address]  °[place] 1-800-659-3381 Substance Abuse/Addiction Treatment  ° °[place] Behavioral Health Resources °Organization         Address  Phone  Notes  °[facility]  (888) 581-9988   °Julie Brannon, PhD [address] [place], [place]   (336) 349-5553 or (336) 951-0000   °[facility]   [address] °[place], [place] (336) 349-4454   °[facility] [address], [place],  (336) 342-8316 Insurance/Medicaid/sponsorship through [facility]  °[facility] [address]                                    [place],  (336) 342-8316 Therapy/tele-psych/case  °[facility]   [address]  ° [place], [place] (336) 349-2233    °Dr. Arfeen  (336) 349-4544   °[facility] [place]  [employer] [facility] [place] [facility] 1) [address], [place] °2) [address], [place] °3)  [address], [place] (336) 349-3220 °(336) 342-7768 ° °(336) 342-8140   °[place] [facility] (336) 342-1394 or (336) 342-3537 (After Hours)    ° ° ° °

## 2014-06-05 NOTE — ED Provider Notes (Signed)
CSN: [id number]     Arrival date & time 06/05/14  1548 History  This chart was scribed for non-physician practitioner, Langston MaskerKaren Sofia, PA-C working with Jerelyn ScottMartha Linker, MD by Greggory StallionKayla Andersen, ED scribe. This patient was seen in room TR05C/TR05C and the patient's care was started at 4:30 PM.   Chief Complaint  Patient presents with  . Dental Pain   The history is provided by the patient. No language interpreter was used.    HPI Comments: Rachel Stuart is a 19 y.o. female who presents to the Emergency Department complaining of worsening diffuse dental pain that started several days ago. Pain has started to radiate into her left ear. She was being seen by a dentist but refused to have work done to her right upper teeth so the dentist stopped treating her. She has not yet taken any medications. Palpation over the areas worsens pain.   Past Medical History  Diagnosis Date  . Ovarian cyst   . Depression   . Obesity   . Ankle fracture    Past Surgical History  Procedure Laterality Date  . No past surgeries     Family History  Problem Relation Age of Onset  . Hypertension Maternal Grandmother   . Diabetes Paternal Grandfather    History  Substance Use Topics  . Smoking status: Never Smoker   . Smokeless tobacco: Not on file  . Alcohol Use: No   OB History    Gravida Para Term Preterm AB TAB SAB Ectopic Multiple Living   2 1 1       1      Review of Systems  HENT: Positive for dental problem and ear pain.   All other systems reviewed and are negative.  Allergies  Amoxicillin  Home Medications   Prior to Admission medications   Not on File   BP 126/66 mmHg  Pulse 65  Temp(Src) 99 F (37.2 C)  Resp 18  SpO2 98%   Physical Exam  Constitutional: She is oriented to person, place, and time. She appears well-developed and well-nourished. No distress.  HENT:  Head: Normocephalic and atraumatic.  Poor dentition. Large cavity to right lower first molar.   Eyes: Conjunctivae and  EOM are normal.  Neck: Neck supple.  Cardiovascular: Normal rate.   Pulmonary/Chest: Effort normal. No respiratory distress.  Musculoskeletal: Normal range of motion.  Neurological: She is alert and oriented to person, place, and time.  Skin: Skin is warm and dry.  Psychiatric: She has a normal mood and affect. Her behavior is normal.  Nursing note and vitals reviewed.   ED Course  Procedures (including critical care time)  DIAGNOSTIC STUDIES: Oxygen Saturation is 98% on RA, normal by my interpretation.    COORDINATION OF CARE: 4:32 PM-Discussed treatment plan which includes pain medication and an antibiotic with pt at bedside and pt agreed to plan. Will give pt dental referrals and advised her to follow up.   Labs Review Labs Reviewed - No data to display  Imaging Review No results found.   EKG Interpretation None      MDM   Final diagnoses:  Toothache    Keflex Ibuprofen Schedule dental evaluation  Elson AreasLeslie K Sofia, PA-C 06/05/14 1653  Jerelyn ScottMartha Linker, MD 06/05/14 1714

## 2014-06-05 NOTE — ED Notes (Signed)
Pt reports tooth ache on left lower side. States she had a dentist that she saw but when she declined to have her cavities filled, they would not see her anymore. Reports pain x 1 month.

## 2014-06-08 ENCOUNTER — Inpatient Hospital Stay (HOSPITAL_COMMUNITY)
Admission: AD | Admit: 2014-06-08 | Discharge: 2014-06-08 | Disposition: A | Discharge status: Home / Self Care | Payer: Medicaid Other | Source: Ambulatory Visit | Attending: Obstetrics & Gynecology | Admitting: Obstetrics & Gynecology

## 2014-06-08 ENCOUNTER — Encounter (HOSPITAL_COMMUNITY): Payer: Self-pay

## 2014-06-08 DIAGNOSIS — O99891 Other specified diseases and conditions complicating pregnancy: Secondary | ICD-10-CM

## 2014-06-08 DIAGNOSIS — M549 Dorsalgia, unspecified: Secondary | ICD-10-CM | POA: Insufficient documentation

## 2014-06-08 DIAGNOSIS — O219 Vomiting of pregnancy, unspecified: Secondary | ICD-10-CM

## 2014-06-08 DIAGNOSIS — O210 Mild hyperemesis gravidarum: Secondary | ICD-10-CM | POA: Insufficient documentation

## 2014-06-08 DIAGNOSIS — Z3A01 Less than 8 weeks gestation of pregnancy: Secondary | ICD-10-CM | POA: Diagnosis not present

## 2014-06-08 DIAGNOSIS — O9989 Other specified diseases and conditions complicating pregnancy, childbirth and the puerperium: Secondary | ICD-10-CM

## 2014-06-08 LAB — POCT PREGNANCY, URINE: Preg Test, Ur: POSITIVE — AB

## 2014-06-08 LAB — URINALYSIS, ROUTINE W REFLEX MICROSCOPIC
Bilirubin Urine: NEGATIVE
Glucose, UA: NEGATIVE mg/dL
HGB URINE DIPSTICK: NEGATIVE
KETONES UR: NEGATIVE mg/dL
NITRITE: NEGATIVE
Protein, ur: NEGATIVE mg/dL
Specific Gravity, Urine: 1.02 (ref 1.005–1.030)
UROBILINOGEN UA: 0.2 mg/dL (ref 0.0–1.0)
pH: 6 (ref 5.0–8.0)

## 2014-06-08 LAB — URINE MICROSCOPIC-ADD ON

## 2014-06-08 MED ORDER — ACETAMINOPHEN 325 MG PO TABS: 650.0000 mg | ORAL_TABLET | Freq: Four times a day (QID) | ORAL | Status: DC | PRN

## 2014-06-08 MED ORDER — PROMETHAZINE HCL 25 MG PO TABS: 25.0000 mg | ORAL_TABLET | Freq: Four times a day (QID) | ORAL | Status: DC | PRN

## 2014-06-08 MED ORDER — CONCEPT OB 130-92.4-1 MG PO CAPS: 1.0000 | ORAL_CAPSULE | Freq: Every day | ORAL | Status: DC

## 2014-06-08 NOTE — Discharge Instructions (Signed)
First Trimester of Pregnancy °The first trimester of pregnancy is from week 1 until the end of week 12 (months 1 through 3). A week after a sperm fertilizes an egg, the egg will implant on the wall of the uterus. This embryo will begin to develop into a baby. Genes from you and your partner are forming the baby. The female genes determine whether the baby is a boy or a girl. At 6-8 weeks, the eyes and face are formed, and the heartbeat can be seen on ultrasound. At the end of 12 weeks, all the baby's organs are formed.  °Now that you are pregnant, you will want to do everything you can to have a healthy baby. Two of the most important things are to get good prenatal care and to follow your health care provider's instructions. Prenatal care is all the medical care you receive before the baby's birth. This care will help prevent, find, and treat any problems during the pregnancy and childbirth. °BODY CHANGES °Your body goes through many changes during pregnancy. The changes vary from woman to woman.  °· You may gain or lose a couple of pounds at first. °· You may feel sick to your stomach (nauseous) and throw up (vomit). If the vomiting is uncontrollable, call your health care provider. °· You may tire easily. °· You may develop headaches that can be relieved by medicines approved by your health care provider. °· You may urinate more often. Painful urination may mean you have a bladder infection. °· You may develop heartburn as a result of your pregnancy. °· You may develop constipation because certain hormones are causing the muscles that push waste through your intestines to slow down. °· You may develop hemorrhoids or swollen, bulging veins (varicose veins). °· Your breasts may begin to grow larger and become tender. Your nipples may stick out more, and the tissue that surrounds them (areola) may become darker. °· Your gums may bleed and may be sensitive to brushing and flossing. °· Dark spots or blotches (chloasma,  mask of pregnancy) may develop on your face. This will likely fade after the baby is born. °· Your menstrual periods will stop. °· You may have a loss of appetite. °· You may develop cravings for certain kinds of food. °· You may have changes in your emotions from day to day, such as being excited to be pregnant or being concerned that something may go wrong with the pregnancy and baby. °· You may have more vivid and strange dreams. °· You may have changes in your hair. These can include thickening of your hair, rapid growth, and changes in texture. Some women also have hair loss during or after pregnancy, or hair that feels dry or thin. Your hair will most likely return to normal after your baby is born. °WHAT TO EXPECT AT YOUR PRENATAL VISITS °During a routine prenatal visit: °· You will be weighed to make sure you and the baby are growing normally. °· Your blood pressure will be taken. °· Your abdomen will be measured to track your baby's growth. °· The fetal heartbeat will be listened to starting around week 10 or 12 of your pregnancy. °· Test results from any previous visits will be discussed. °Your health care provider may ask you: °· How you are feeling. °· If you are feeling the baby move. °· If you have had any abnormal symptoms, such as leaking fluid, bleeding, severe headaches, or abdominal cramping. °· If you have any questions. °Other tests   that may be performed during your first trimester include: °· Blood tests to find your blood type and to check for the presence of any previous infections. They will also be used to check for low iron levels (anemia) and Rh antibodies. Later in the pregnancy, blood tests for diabetes will be done along with other tests if problems develop. °· Urine tests to check for infections, diabetes, or protein in the urine. °· An ultrasound to confirm the proper growth and development of the baby. °· An amniocentesis to check for possible genetic problems. °· Fetal screens for  spina bifida and Down syndrome. °· You may need other tests to make sure you and the baby are doing well. °HOME CARE INSTRUCTIONS  °Medicines °· Follow your health care provider's instructions regarding medicine use. Specific medicines may be either safe or unsafe to take during pregnancy. °· Take your prenatal vitamins as directed. °· If you develop constipation, try taking a stool softener if your health care provider approves. °Diet °· Eat regular, well-balanced meals. Choose a variety of foods, such as meat or vegetable-based protein, fish, milk and low-fat dairy products, vegetables, fruits, and whole grain breads and cereals. Your health care provider will help you determine the amount of weight gain that is right for you. °· Avoid raw meat and uncooked cheese. These carry germs that can cause birth defects in the baby. °· Eating four or five small meals rather than three large meals a day may help relieve nausea and vomiting. If you start to feel nauseous, eating a few soda crackers can be helpful. Drinking liquids between meals instead of during meals also seems to help nausea and vomiting. °· If you develop constipation, eat more high-fiber foods, such as fresh vegetables or fruit and whole grains. Drink enough fluids to keep your urine clear or pale yellow. °Activity and Exercise °· Exercise only as directed by your health care provider. Exercising will help you: °¨ Control your weight. °¨ Stay in shape. °¨ Be prepared for labor and delivery. °· Experiencing pain or cramping in the lower abdomen or low back is a good sign that you should stop exercising. Check with your health care provider before continuing normal exercises. °· Try to avoid standing for long periods of time. Move your legs often if you must stand in one place for a long time. °· Avoid heavy lifting. °· Wear low-heeled shoes, and practice good posture. °· You may continue to have sex unless your health care provider directs you  otherwise. °Relief of Pain or Discomfort °· Wear a good support bra for breast tenderness.   °· Take warm sitz baths to soothe any pain or discomfort caused by hemorrhoids. Use hemorrhoid cream if your health care provider approves.   °· Rest with your legs elevated if you have leg cramps or low back pain. °· If you develop varicose veins in your legs, wear support hose. Elevate your feet for 15 minutes, 3-4 times a day. Limit salt in your diet. °Prenatal Care °· Schedule your prenatal visits by the twelfth week of pregnancy. They are usually scheduled monthly at first, then more often in the last 2 months before delivery. °· Write down your questions. Take them to your prenatal visits. °· Keep all your prenatal visits as directed by your health care provider. °Safety °· Wear your seat belt at all times when driving. °· Make a list of emergency phone numbers, including numbers for family, friends, the hospital, and police and fire departments. °General Tips °·   Ask your health care provider for a referral to a local prenatal education class. Begin classes no later than at the beginning of month 6 of your pregnancy.  Ask for help if you have counseling or nutritional needs during pregnancy. Your health care provider can offer advice or refer you to specialists for help with various needs.  Do not use hot tubs, steam rooms, or saunas.  Do not douche or use tampons or scented sanitary pads.  Do not cross your legs for long periods of time.  Avoid cat litter boxes and soil used by cats. These carry germs that can cause birth defects in the baby and possibly loss of the fetus by miscarriage or stillbirth.  Avoid all smoking, herbs, alcohol, and medicines not prescribed by your health care provider. Chemicals in these affect the formation and growth of the baby.  Schedule a dentist appointment. At home, brush your teeth with a soft toothbrush and be gentle when you floss. SEEK MEDICAL CARE IF:   You have  dizziness.  You have mild pelvic cramps, pelvic pressure, or nagging pain in the abdominal area.  You have persistent nausea, vomiting, or diarrhea.  You have a bad smelling vaginal discharge.  You have pain with urination.  You notice increased swelling in your face, hands, legs, or ankles. SEEK IMMEDIATE MEDICAL CARE IF:   You have a fever.  You are leaking fluid from your vagina.  You have spotting or bleeding from your vagina.  You have severe abdominal cramping or pain.  You have rapid weight gain or loss.  You vomit blood or material that looks like coffee grounds.  You are exposed to [place] measles and have never had them.  You are exposed to fifth disease or chickenpox.  You develop a severe headache.  You have shortness of breath.  You have any kind of trauma, such as from a fall or a car accident. Document Released: 03/11/2001 Document Revised: 08/01/2013 Document Reviewed: 01/25/2013 [facility] Patient Information 2015 [place], [place]. This information is not intended to replace advice given to you by your health care provider. Make sure you discuss any questions you have with your health care provider.  Morning Sickness Morning sickness is when you feel sick to your stomach (nauseous) during pregnancy. This nauseous feeling may or may not come with vomiting. It often occurs in the morning but can be a problem any time of day. Morning sickness is most common during the first trimester, but it may continue throughout pregnancy. While morning sickness is unpleasant, it is usually harmless unless you develop severe and continual vomiting (hyperemesis gravidarum). This condition requires more intense treatment.  CAUSES  The cause of morning sickness is not completely known but seems to be related to normal hormonal changes that occur in pregnancy. RISK FACTORS You are at greater risk if you:  Experienced nausea or vomiting before your pregnancy.  Had morning  sickness during a previous pregnancy.  Are pregnant with more than one baby, such as twins. TREATMENT  Do not use any medicines (prescription, over-the-counter, or herbal) for morning sickness without first talking to your health care provider. Your health care provider may prescribe or recommend:  Vitamin B6 supplements.  Anti-nausea medicines.  The herbal medicine ginger. HOME CARE INSTRUCTIONS   Only take over-the-counter or prescription medicines as directed by your health care provider.  Taking multivitamins before getting pregnant can prevent or decrease the severity of morning sickness in most women.  Eat a piece of dry  toast or unsalted crackers before getting out of bed in the morning.  Eat five or six small meals a day.  Eat dry and bland foods (rice, baked potato). Foods high in carbohydrates are often helpful.  Do not drink liquids with your meals. Drink liquids between meals.  Avoid greasy, fatty, and spicy foods.  Get someone to cook for you if the smell of any food causes nausea and vomiting.  If you feel nauseous after taking prenatal vitamins, take the vitamins at night or with a snack.  Snack on protein foods (nuts, yogurt, cheese) between meals if you are hungry.  Eat unsweetened gelatins for desserts.  Wearing an acupressure wristband (worn for sea sickness) may be helpful.  Acupuncture may be helpful.  Do not smoke.  Get a humidifier to keep the air in your house free of odors.  Get plenty of fresh air. SEEK MEDICAL CARE IF:   Your home remedies are not working, and you need medicine.  You feel dizzy or lightheaded.  You are losing weight. SEEK IMMEDIATE MEDICAL CARE IF:   You have persistent and uncontrolled nausea and vomiting.  You pass out (faint). MAKE SURE YOU:  Understand these instructions.  Will watch your condition.  Will get help right away if you are not doing well or get worse. Document Released: 05/08/2006 Document  Revised: 03/22/2013 Document Reviewed: 09/01/2012 [facility] Patient Information 2015 [place], [place]. This information is not intended to replace advice given to you by your health care provider. Make sure you discuss any questions you have with your health care provider.   Back Exercises Back exercises help treat and prevent back injuries. The goal of back exercises is to increase the strength of your abdominal and back muscles and the flexibility of your back. These exercises should be started when you no longer have back pain. Back exercises include:  Pelvic Tilt. Lie on your back with your knees bent. Tilt your pelvis until the lower part of your back is against the floor. Hold this position 5 to 10 sec and repeat 5 to 10 times.  Knee to Chest. Pull first 1 knee up against your chest and hold for 20 to 30 seconds, repeat this with the other knee, and then both knees. This may be done with the other leg straight or bent, whichever feels better.  Sit-Ups or Curl-Ups. Bend your knees 90 degrees. Start with tilting your pelvis, and do a partial, slow sit-up, lifting your trunk only 30 to 45 degrees off the floor. Take at least 2 to 3 seconds for each sit-up. Do not do sit-ups with your knees out straight. If partial sit-ups are difficult, simply do the above but with only tightening your abdominal muscles and holding it as directed.  Hip-Lift. Lie on your back with your knees flexed 90 degrees. Push down with your feet and shoulders as you raise your hips a couple inches off the floor; hold for 10 seconds, repeat 5 to 10 times.  Back arches. Lie on your stomach, propping yourself up on bent elbows. Slowly press on your hands, causing an arch in your low back. Repeat 3 to 5 times. Any initial stiffness and discomfort should lessen with repetition over time.  Shoulder-Lifts. Lie face down with arms beside your body. Keep hips and torso pressed to floor as you slowly lift your head and shoulders off the  floor. Do not overdo your exercises, especially in the beginning. Exercises may cause you some mild back discomfort which lasts  for a few minutes; however, if the pain is more severe, or lasts for more than 15 minutes, do not continue exercises until you see your caregiver. Improvement with exercise therapy for back problems is slow.  See your caregivers for assistance with developing a proper back exercise program. Document Released: 04/24/2004 Document Revised: 06/09/2011 Document Reviewed: 01/16/2011 [facility] Patient Information 2015 [place], [place]. This information is not intended to replace advice given to you by your health care provider. Make sure you discuss any questions you have with your health care provider.

## 2014-06-08 NOTE — MAU Note (Signed)
Pt reports having back pain and nausea since last Monday. Late for her cycle.

## 2014-06-08 NOTE — MAU Note (Signed)
Pt stated she has occasional dizziness that began this past Monday 06/05/14

## 2014-06-10 ENCOUNTER — Emergency Department (HOSPITAL_COMMUNITY)
Admission: EM | Admit: 2014-06-10 | Discharge: 2014-06-10 | Disposition: A | Discharge status: Home / Self Care | Payer: Medicaid Other | Attending: Emergency Medicine | Admitting: Emergency Medicine

## 2014-06-10 ENCOUNTER — Encounter (HOSPITAL_COMMUNITY): Payer: Self-pay | Admitting: Family Medicine

## 2014-06-10 DIAGNOSIS — Z792 Long term (current) use of antibiotics: Secondary | ICD-10-CM | POA: Insufficient documentation

## 2014-06-10 DIAGNOSIS — Z8781 Personal history of (healed) traumatic fracture: Secondary | ICD-10-CM | POA: Insufficient documentation

## 2014-06-10 DIAGNOSIS — Z79899 Other long term (current) drug therapy: Secondary | ICD-10-CM | POA: Insufficient documentation

## 2014-06-10 DIAGNOSIS — E669 Obesity, unspecified: Secondary | ICD-10-CM | POA: Diagnosis not present

## 2014-06-10 DIAGNOSIS — Z880 Allergy status to penicillin: Secondary | ICD-10-CM | POA: Diagnosis not present

## 2014-06-10 DIAGNOSIS — Z3A01 Less than 8 weeks gestation of pregnancy: Secondary | ICD-10-CM | POA: Insufficient documentation

## 2014-06-10 DIAGNOSIS — O99211 Obesity complicating pregnancy, first trimester: Secondary | ICD-10-CM | POA: Diagnosis not present

## 2014-06-10 DIAGNOSIS — R1013 Epigastric pain: Secondary | ICD-10-CM | POA: Diagnosis not present

## 2014-06-10 DIAGNOSIS — Z8659 Personal history of other mental and behavioral disorders: Secondary | ICD-10-CM | POA: Diagnosis not present

## 2014-06-10 DIAGNOSIS — O9989 Other specified diseases and conditions complicating pregnancy, childbirth and the puerperium: Secondary | ICD-10-CM | POA: Insufficient documentation

## 2014-06-10 LAB — CBC WITH DIFFERENTIAL/PLATELET
Basophils Absolute: 0 10*3/uL (ref 0.0–0.1)
Basophils Relative: 0 % (ref 0–1)
EOS PCT: 2 % (ref 0–5)
Eosinophils Absolute: 0.1 10*3/uL (ref 0.0–0.7)
HEMATOCRIT: 40.1 % (ref 36.0–46.0)
HEMOGLOBIN: 13.8 g/dL (ref 12.0–15.0)
Lymphocytes Relative: 27 % (ref 12–46)
Lymphs Abs: 2 10*3/uL (ref 0.7–4.0)
MCH: 28.5 pg (ref 26.0–34.0)
MCHC: 34.4 g/dL (ref 30.0–36.0)
MCV: 82.9 fL (ref 78.0–100.0)
Monocytes Absolute: 0.3 10*3/uL (ref 0.1–1.0)
Monocytes Relative: 4 % (ref 3–12)
Neutro Abs: 4.8 10*3/uL (ref 1.7–7.7)
Neutrophils Relative %: 67 % (ref 43–77)
PLATELETS: 266 10*3/uL (ref 150–400)
RBC: 4.84 MIL/uL (ref 3.87–5.11)
RDW: 12.9 % (ref 11.5–15.5)
WBC: 7.2 10*3/uL (ref 4.0–10.5)

## 2014-06-10 LAB — LIPASE, BLOOD: Lipase: 21 U/L (ref 11–59)

## 2014-06-10 LAB — URINALYSIS, ROUTINE W REFLEX MICROSCOPIC
Bilirubin Urine: NEGATIVE
GLUCOSE, UA: NEGATIVE mg/dL
Hgb urine dipstick: NEGATIVE
KETONES UR: NEGATIVE mg/dL
Nitrite: NEGATIVE
Protein, ur: NEGATIVE mg/dL
Specific Gravity, Urine: 1.022 (ref 1.005–1.030)
UROBILINOGEN UA: 0.2 mg/dL (ref 0.0–1.0)
pH: 6.5 (ref 5.0–8.0)

## 2014-06-10 LAB — COMPREHENSIVE METABOLIC PANEL
ALK PHOS: 50 U/L (ref 39–117)
ALT: 18 U/L (ref 0–35)
AST: 24 U/L (ref 0–37)
Albumin: 3.8 g/dL (ref 3.5–5.2)
Anion gap: 9 (ref 5–15)
BUN: 7 mg/dL (ref 6–23)
CO2: 24 mmol/L (ref 19–32)
CREATININE: 0.67 mg/dL (ref 0.50–1.10)
Calcium: 9.1 mg/dL (ref 8.4–10.5)
Chloride: 106 mmol/L (ref 96–112)
GFR calc Af Amer: >90 mL/min (ref >90)
GFR calc non Af Amer: >90 mL/min (ref >90)
Glucose, Bld: 97 mg/dL (ref 70–99)
POTASSIUM: 3.6 mmol/L (ref 3.5–5.1)
Sodium: 139 mmol/L (ref 135–145)
TOTAL PROTEIN: 6.6 g/dL (ref 6.0–8.3)
Total Bilirubin: 1 mg/dL (ref 0.3–1.2)

## 2014-06-10 LAB — URINE MICROSCOPIC-ADD ON

## 2014-06-10 LAB — I-STAT BETA HCG BLOOD, ED (MC, WL, AP ONLY): I-stat hCG, quantitative: 1016.9 m[IU]/mL — ABNORMAL HIGH (ref <5)

## 2014-06-10 NOTE — ED Notes (Signed)
NAD at this time. Pt walked out by this RN.

## 2014-06-10 NOTE — Discharge Instructions (Signed)
Return to the ED with any concerns including worsening abdominal pain, fever/chills, vaginal bleeding, vomiting and not able to keep down liquids, decreased level of alertness/lethargy, or any other alarming symptoms

## 2014-06-10 NOTE — ED Provider Notes (Signed)
CSN: [id number]     Arrival date & time 06/10/14  1253 History   First MD Initiated Contact with Patient 06/10/14 1531     Chief Complaint  Patient presents with  . Abdominal Pain     (Consider location/radiation/quality/duration/timing/severity/associated sxs/prior Treatment) HPI  Pt presents with c/o upper abdominal pain.  She states she has been having nausea over the past several days and was [facility] as being [facility]  She states that today she began to have fleeting upper abdominal pains associated with the nausea, drinking water did not help so she came to the ED for evaluation.  No fever/chills.  One episode of emesis this morning- nonbloody and nonbilious.  No lower abdominal or pelvic pain. No vaginal bleeding or vaginal discharge.  No dysuria.  There are no other associated systemic symptoms, there are no other alleviating or modifying factors.   Past Medical History  Diagnosis Date  . Ovarian cyst   . Depression   . Obesity   . Ankle fracture    Past Surgical History  Procedure Laterality Date  . No past surgeries     Family History  Problem Relation Age of Onset  . Hypertension Maternal Grandmother   . Diabetes Maternal Grandmother   . Diabetes Paternal Grandfather   . Depression Mother   . Mental illness Mother    History  Substance Use Topics  . Smoking status: Never Smoker   . Smokeless tobacco: Never Used  . Alcohol Use: No   OB History    Gravida Para Term Preterm AB TAB SAB Ectopic Multiple Living   3 1 1  1  1   1       Obstetric Comments   Pt stated she lost consciousness during labor due to intense pain. After receiving epidural birth went well.      Review of Systems  ROS reviewed and all otherwise negative except for mentioned in HPI    Allergies  Amoxicillin  Home Medications   Prior to Admission medications   Medication Sig Start Date End Date Taking? Authorizing Provider  Prenat w/o A  Vit-FeFum-FePo-FA (CONCEPT OB) 130-92.4-1 MG CAPS Take 1 tablet by mouth daily. 06/08/14  Yes Dorathy KinsmanVirginia Smith, CNM  promethazine (PHENERGAN) 25 MG tablet Take 1 tablet (25 mg total) by mouth every 6 (six) hours as needed for nausea or vomiting. 06/08/14  Yes Dorathy KinsmanVirginia Smith, CNM  acetaminophen (TYLENOL) 325 MG tablet Take 2 tablets (650 mg total) by mouth every 6 (six) hours as needed for moderate pain. 06/08/14   Dorathy KinsmanVirginia Smith, CNM  cephALEXin (KEFLEX) 500 MG capsule Take 1 capsule (500 mg total) by mouth 4 (four) times daily. Patient not taking: Reported on 06/08/2014 06/05/14   Elson AreasLeslie K Sofia, PA-C   BP 117/64 mmHg  Pulse 82  Temp(Src) 98.2 F (36.8 C) (Oral)  Resp 18  SpO2 99%  LMP 05/05/2014  Vitals reviewed Physical Exam  Physical Examination: General appearance - alert, well appearing, and in no distress Mental status - alert, oriented to person, place, and time Eyes -no conjunctival injection, no scleral icterus Mouth - mucous membranes moist, pharynx normal without lesions Chest - clear to auscultation, no wheezes, rales or rhonchi, symmetric air entry Heart - normal rate, regular rhythm, normal S1, S2, no murmurs, rubs, clicks or gallops Abdomen - soft, nontender, nondistended, no masses or organomegaly, nabs Extremities - peripheral pulses normal, no pedal edema, no clubbing or cyanosis Skin - normal coloration and  turgor, no rashes  ED Course  Procedures (including critical care time) Labs Review Labs Reviewed  URINALYSIS, ROUTINE W REFLEX MICROSCOPIC - Abnormal; Notable for the following:    APPearance CLOUDY (*)    Leukocytes, UA MODERATE (*)    All other components within normal limits  URINE MICROSCOPIC-ADD ON - Abnormal; Notable for the following:    Squamous Epithelial / LPF FEW (*)    All other components within normal limits  I-STAT BETA HCG BLOOD, ED ([facility], [facility], [facility] ONLY) - Abnormal; Notable for the following:    I-stat hCG, quantitative 1016.9 (*)    All other  components within normal limits  URINE CULTURE  CBC WITH DIFFERENTIAL/PLATELET  COMPREHENSIVE METABOLIC PANEL  LIPASE, BLOOD    Imaging Review No results found.   EKG Interpretation None      MDM   Final diagnoses:  Epigastric pain    Pt presenting with upper abdominal pain and some nausea.  She was diagnosed as being pregnant [facility]  Today has no abdominal tenderness, no pelvic tenderness or lower abdominal pain.  No vaginal bleeding.  hcg is 1000.  Very low suspicion for ectopic pregnancy given no lower abdominal pain or vaginal bleeding.  Pt given information to followup with [facility]. Discharged with strict return precautions.  Pt agreeable with plan.    Jerelyn Scott, MD 06/10/14 2010

## 2014-06-10 NOTE — ED Notes (Signed)
Pt here for upper abdominal pain across her abdomen since this am. sts [facility] and told she was [redacted] weeks pregnant. Denies them doing US. Denies any vaginal bleeding discharge.

## 2014-06-12 LAB — URINE CULTURE: Colony Count: 80000

## 2014-06-12 NOTE — MAU Provider Note (Signed)
Chief Complaint: Possible Pregnancy; Back Pain; and Nausea   First Provider Initiated Contact with Patient 06/08/14 1611      SUBJECTIVE HPI: Rachel Stuart is a 19 y.o. G62P1011 female who presents w/ nausea, late menses and mid back pain x 1 week. Denies VB, Abd pain. Vomited a few times, btu able to keep down food and fluids. Has not taken UPT. Pain is midline, constant, relieved by putting a pillow under her back.   Past Medical History  Diagnosis Date  . Ovarian cyst   . Depression   . Obesity   . Ankle fracture    OB History  Gravida Para Term Preterm AB SAB TAB Ectopic Multiple Living  # Outcome Date GA Lbr Len/2nd Weight Sex Delivery Anes PTL Lv  3 Current           2 Term 02/26/10    F Vag-Spont None    1 SAB             Obstetric Comments  Pt stated she lost consciousness during labor due to intense pain. After receiving epidural birth went well.    Past Surgical History  Procedure Laterality Date  . No past surgeries     History   Social History  . Marital Status: Single    Spouse Name: N/A  . Number of Children: N/A  . Years of Education: N/A   Occupational History  . Not on file.   Social History Main Topics  . Smoking status: Never Smoker   . Smokeless tobacco: Never Used  . Alcohol Use: No  . Drug Use: No  . Sexual Activity: Not on file   Other Topics Concern  . Not on file   Social History Narrative   No current facility-administered medications on file prior to encounter.   Current Outpatient Prescriptions on File Prior to Encounter  Medication Sig Dispense Refill  . cephALEXin (KEFLEX) 500 MG capsule Take 1 capsule (500 mg total) by mouth 4 (four) times daily. (Patient not taking: Reported on 06/08/2014) 40 capsule 0   Allergies  Allergen Reactions  . Amoxicillin Swelling    Lips swell   Review of Systems  Constitutional: Negative for fever and chills.  Respiratory: Negative for shortness of breath.    Gastrointestinal: Positive for nausea. Negative for abdominal pain, diarrhea and constipation.  Genitourinary: Negative for dysuria, urgency, frequency, hematuria and flank pain.  Musculoskeletal: Positive for back pain. Negative for myalgias.   OBJECTIVE Blood pressure 134/73, pulse 79, temperature 99.1 F (37.3 C), resp. rate 18, height  (1.575 m), weight 226 lb (102.513 kg), last menstrual period 05/05/2014. GENERAL: Well-developed, well-nourished obese female in no acute distress.  HEENT: Normocephalic HEART: normal rate RESP: normal effort ABDOMEN: Soft, non-tender. Fundus not palpable.  BACK: No CVAT. Normal ROM. Mild tenderness of paraspinous  Muscles.   EXTREMITIES: Nontender, no edema. Normal gait and sensation.  NEURO: Alert and oriented SPECULUM EXAM: Declined  LAB RESULTS UPT pos UA mod leuks. Otherwise neg.  IMAGING No results found.  MAU COURSE  ASSESSMENT 1. Nausea and vomiting during pregnancy prior to [redacted] weeks gestation   2. Back pain affecting pregnancy in first trimester   [redacted]w[redacted]d by LMP  PLAN Discharge home in stable condition. Comfort measures.  Tylenol PRN Pregnancy verification letter given. List of providers given. First trimester precautions.     Follow-up Information    Please follow up.  Why:  Start prenatal care      [facility]   Why:  As needed in emergencies   Contact information:   [address] [id number] [address] [place] [id number] [phone]       Medication List    STOP taking these medications        ibuprofen 800 MG tablet  Commonly known as:  ADVIL,MOTRIN      TAKE these medications        acetaminophen 325 MG tablet  Commonly known as:  TYLENOL  Take 2 tablets (650 mg total) by mouth every 6 (six) hours as needed for moderate pain.     cephALEXin 500 MG capsule  Commonly known as:  KEFLEX  Take 1 capsule (500 mg total) by  mouth 4 (four) times daily.     CONCEPT OB 130-92.4-1 MG Caps  Take 1 tablet by mouth daily.     promethazine 25 MG tablet  Commonly known as:  PHENERGAN  Take 1 tablet (25 mg total) by mouth every 6 (six) hours as needed for nausea or vomiting.        [place] Bridgette Wolden, CNM 06/12/2014  9:29 AM

## 2014-06-20 ENCOUNTER — Inpatient Hospital Stay (HOSPITAL_COMMUNITY)
Admission: AD | Admit: 2014-06-20 | Discharge: 2014-06-20 | Discharge status: Against Medical Advice | Payer: Medicaid Other | Source: Ambulatory Visit | Attending: Family Medicine | Admitting: Family Medicine

## 2014-06-20 DIAGNOSIS — Z5320 Procedure and treatment not carried out because of patient's decision for unspecified reasons: Secondary | ICD-10-CM | POA: Diagnosis not present

## 2014-06-20 DIAGNOSIS — Z3A01 Less than 8 weeks gestation of pregnancy: Secondary | ICD-10-CM | POA: Insufficient documentation

## 2014-06-20 DIAGNOSIS — O210 Mild hyperemesis gravidarum: Secondary | ICD-10-CM | POA: Insufficient documentation

## 2014-06-20 LAB — URINALYSIS, ROUTINE W REFLEX MICROSCOPIC
GLUCOSE, UA: NEGATIVE mg/dL
HGB URINE DIPSTICK: NEGATIVE
Ketones, ur: >80 mg/dL — AB
NITRITE: NEGATIVE
PH: 6 (ref 5.0–8.0)
PROTEIN: NEGATIVE mg/dL
Specific Gravity, Urine: 1.02 (ref 1.005–1.030)
UROBILINOGEN UA: 0.2 mg/dL (ref 0.0–1.0)

## 2014-06-20 LAB — URINE MICROSCOPIC-ADD ON

## 2014-06-20 NOTE — MAU Note (Signed)
Not in lobby #2 

## 2014-06-20 NOTE — MAU Note (Signed)
Not in lobby

## 2014-06-20 NOTE — MAU Note (Addendum)
Past 3 days has had really bad nausea, can't keep nothing down. Was given medication- it isn't helping.  Thrown up 3 times today.

## 2014-06-20 NOTE — MAU Note (Signed)
Not in lobby #3 

## 2014-07-03 LAB — OB RESULTS CONSOLE ABO/RH: RH TYPE: POSITIVE

## 2014-07-03 LAB — OB RESULTS CONSOLE ANTIBODY SCREEN: Antibody Screen: NEGATIVE

## 2014-07-03 LAB — OB RESULTS CONSOLE HIV ANTIBODY (ROUTINE TESTING): HIV: NONREACTIVE

## 2014-07-03 LAB — OB RESULTS CONSOLE GC/CHLAMYDIA
CHLAMYDIA, DNA PROBE: NEGATIVE
Gonorrhea: NEGATIVE

## 2014-07-03 LAB — OB RESULTS CONSOLE HEPATITIS B SURFACE ANTIGEN: HEP B S AG: NEGATIVE

## 2014-07-03 LAB — OB RESULTS CONSOLE RUBELLA ANTIBODY, IGM: Rubella: IMMUNE

## 2014-07-03 LAB — OB RESULTS CONSOLE RPR: RPR: NONREACTIVE

## 2014-07-31 ENCOUNTER — Other Ambulatory Visit (HOSPITAL_COMMUNITY): Payer: Self-pay | Admitting: Urology

## 2014-07-31 DIAGNOSIS — O3680X1 Pregnancy with inconclusive fetal viability, fetus 1: Secondary | ICD-10-CM

## 2014-07-31 DIAGNOSIS — Z3A13 13 weeks gestation of pregnancy: Secondary | ICD-10-CM

## 2014-07-31 DIAGNOSIS — Z3682 Encounter for antenatal screening for nuchal translucency: Secondary | ICD-10-CM

## 2014-08-04 ENCOUNTER — Ambulatory Visit (HOSPITAL_COMMUNITY)
Admission: RE | Admit: 2014-08-04 | Discharge: 2014-08-04 | Disposition: A | Discharge status: Home / Self Care | Payer: Medicaid Other | Source: Ambulatory Visit | Attending: Physician Assistant | Admitting: Physician Assistant

## 2014-08-04 ENCOUNTER — Encounter (HOSPITAL_COMMUNITY): Payer: Self-pay

## 2014-08-04 DIAGNOSIS — Z3A13 13 weeks gestation of pregnancy: Secondary | ICD-10-CM | POA: Diagnosis not present

## 2014-08-04 DIAGNOSIS — Z36 Encounter for antenatal screening of mother: Secondary | ICD-10-CM | POA: Diagnosis not present

## 2014-08-04 DIAGNOSIS — O3680X1 Pregnancy with inconclusive fetal viability, fetus 1: Secondary | ICD-10-CM

## 2014-08-04 DIAGNOSIS — O99211 Obesity complicating pregnancy, first trimester: Secondary | ICD-10-CM | POA: Diagnosis not present

## 2014-08-04 DIAGNOSIS — Z3682 Encounter for antenatal screening for nuchal translucency: Secondary | ICD-10-CM | POA: Insufficient documentation

## 2014-08-08 ENCOUNTER — Other Ambulatory Visit (HOSPITAL_COMMUNITY): Payer: Medicaid Other

## 2014-08-14 ENCOUNTER — Other Ambulatory Visit (HOSPITAL_COMMUNITY): Payer: Self-pay | Admitting: Urology

## 2014-08-29 ENCOUNTER — Other Ambulatory Visit (HOSPITAL_COMMUNITY): Payer: Self-pay | Admitting: Urology

## 2014-08-29 DIAGNOSIS — Z3689 Encounter for other specified antenatal screening: Secondary | ICD-10-CM

## 2014-09-19 ENCOUNTER — Ambulatory Visit (HOSPITAL_COMMUNITY): Payer: Medicaid Other

## 2014-09-19 ENCOUNTER — Other Ambulatory Visit (HOSPITAL_COMMUNITY): Payer: Self-pay | Admitting: Urology

## 2014-09-19 ENCOUNTER — Ambulatory Visit (HOSPITAL_COMMUNITY)
Admission: RE | Admit: 2014-09-19 | Discharge: 2014-09-19 | Disposition: A | Discharge status: Home / Self Care | Payer: Medicaid Other | Source: Ambulatory Visit | Attending: Urology | Admitting: Urology

## 2014-09-19 DIAGNOSIS — E669 Obesity, unspecified: Secondary | ICD-10-CM | POA: Diagnosis not present

## 2014-09-19 DIAGNOSIS — O99212 Obesity complicating pregnancy, second trimester: Secondary | ICD-10-CM | POA: Diagnosis not present

## 2014-09-19 DIAGNOSIS — Z36 Encounter for antenatal screening of mother: Secondary | ICD-10-CM | POA: Insufficient documentation

## 2014-09-19 DIAGNOSIS — Z3689 Encounter for other specified antenatal screening: Secondary | ICD-10-CM

## 2014-09-19 DIAGNOSIS — Z3A19 19 weeks gestation of pregnancy: Secondary | ICD-10-CM | POA: Diagnosis not present

## 2014-09-19 DIAGNOSIS — O99210 Obesity complicating pregnancy, unspecified trimester: Secondary | ICD-10-CM | POA: Insufficient documentation

## 2014-10-11 ENCOUNTER — Other Ambulatory Visit (HOSPITAL_COMMUNITY): Payer: Self-pay | Admitting: Urology

## 2014-10-11 DIAGNOSIS — IMO0002 Reserved for concepts with insufficient information to code with codable children: Secondary | ICD-10-CM

## 2014-10-11 DIAGNOSIS — Z0489 Encounter for examination and observation for other specified reasons: Secondary | ICD-10-CM

## 2014-10-13 ENCOUNTER — Ambulatory Visit (HOSPITAL_COMMUNITY)
Admission: RE | Admit: 2014-10-13 | Discharge: 2014-10-13 | Disposition: A | Discharge status: Home / Self Care | Payer: Medicaid Other | Source: Ambulatory Visit | Attending: Nurse Practitioner | Admitting: Nurse Practitioner

## 2014-10-13 DIAGNOSIS — Z36 Encounter for antenatal screening of mother: Secondary | ICD-10-CM | POA: Insufficient documentation

## 2014-10-13 DIAGNOSIS — Z0489 Encounter for examination and observation for other specified reasons: Secondary | ICD-10-CM | POA: Insufficient documentation

## 2014-10-13 DIAGNOSIS — Z3A23 23 weeks gestation of pregnancy: Secondary | ICD-10-CM | POA: Insufficient documentation

## 2014-10-13 DIAGNOSIS — IMO0002 Reserved for concepts with insufficient information to code with codable children: Secondary | ICD-10-CM

## 2014-10-13 DIAGNOSIS — Z3A00 Weeks of gestation of pregnancy not specified: Secondary | ICD-10-CM | POA: Diagnosis not present

## 2014-11-27 ENCOUNTER — Encounter (HOSPITAL_COMMUNITY): Payer: Self-pay

## 2014-11-27 ENCOUNTER — Inpatient Hospital Stay (HOSPITAL_COMMUNITY)
Admission: AD | Admit: 2014-11-27 | Discharge: 2014-11-27 | Disposition: A | Discharge status: Home / Self Care | Payer: Medicaid Other | Source: Ambulatory Visit | Attending: Family Medicine | Admitting: Family Medicine

## 2014-11-27 DIAGNOSIS — Z3A29 29 weeks gestation of pregnancy: Secondary | ICD-10-CM | POA: Diagnosis not present

## 2014-11-27 DIAGNOSIS — O212 Late vomiting of pregnancy: Secondary | ICD-10-CM | POA: Insufficient documentation

## 2014-11-27 DIAGNOSIS — O26893 Other specified pregnancy related conditions, third trimester: Secondary | ICD-10-CM

## 2014-11-27 DIAGNOSIS — R12 Heartburn: Secondary | ICD-10-CM | POA: Insufficient documentation

## 2014-11-27 DIAGNOSIS — O219 Vomiting of pregnancy, unspecified: Secondary | ICD-10-CM

## 2014-11-27 HISTORY — DX: Anxiety disorder, unspecified: F41.9

## 2014-11-27 LAB — URINALYSIS, ROUTINE W REFLEX MICROSCOPIC
Glucose, UA: NEGATIVE mg/dL
HGB URINE DIPSTICK: NEGATIVE
Ketones, ur: >80 mg/dL — AB
Nitrite: NEGATIVE
PH: 6 (ref 5.0–8.0)
Protein, ur: 30 mg/dL — AB
Specific Gravity, Urine: >1.03 — ABNORMAL HIGH (ref 1.005–1.030)
UROBILINOGEN UA: 1 mg/dL (ref 0.0–1.0)

## 2014-11-27 LAB — URINE MICROSCOPIC-ADD ON

## 2014-11-27 MED ORDER — FAMOTIDINE 20 MG PO TABS
20.0000 mg | ORAL_TABLET | Freq: Once | ORAL | Status: AC
  Administered 2014-11-27: 20 mg via ORAL
  Filled 2014-11-27: qty 1

## 2014-11-27 MED ORDER — FAMOTIDINE 20 MG PO TABS: 20.0000 mg | ORAL_TABLET | Freq: Two times a day (BID) | ORAL | Status: DC

## 2014-11-27 MED ORDER — ONDANSETRON HCL 4 MG PO TABS: 4.0000 mg | ORAL_TABLET | Freq: Four times a day (QID) | ORAL | Status: DC

## 2014-11-27 MED ORDER — ONDANSETRON 8 MG PO TBDP
8.0000 mg | ORAL_TABLET | Freq: Once | ORAL | Status: AC
  Administered 2014-11-27: 8 mg via ORAL
  Filled 2014-11-27: qty 1

## 2014-11-27 NOTE — MAU Note (Signed)
Pt states here via EMS for dizziness/nausea/vomiting. Has not felt this way until taking first dose of Zoloft  at 1100, then took promethazine 30 minutes ago and began feeling worse.

## 2014-11-27 NOTE — MAU Note (Signed)
RT called for EKG.

## 2014-11-27 NOTE — Discharge Instructions (Signed)
Heartburn During Pregnancy ° Heartburn happens when stomach acid goes up into the esophagus. The esophagus is the tube between the mouth and the stomach. This acid causes a burning pain in the chest or throat. This happens more often in the later part of pregnancy because the womb (uterus) gets larger. It may also happen because of hormone changes. Heartburn problems often go away after giving birth. °HOME CARE °· Take all medicine as told by your doctor. °· Raise the head of your bed with blocks only as told by your doctor. °· Do not exercise right after eating. °· Avoid eating 2-3 hours before bed. Do not lie down right after eating. °· Eat small meals throughout the day instead of 3 large meals. °· Avoid foods that give you heartburn. Foods you may want to avoid include: °¨ Peppers. °¨ Chocolate. °¨ High-fat foods, including fried foods. °¨ Spicy foods. °¨ Garlic and onions. °¨ Citrus fruits, including oranges, grapefruit, lemons, and limes. °¨ Food containing tomatoes or tomato products. °¨ Mint. °¨ Bubbly (carbonated) drinks and drinks with caffeine. °¨ Vinegar. °GET HELP IF: °· You have any belly (abdominal) pain. °· You feel burning in your upper belly or chest, especially after eating or lying down. °· You feel sick to your stomach (nauseous) and throw up (vomit). °· Your stomach feels upset after you eat. °GET HELP RIGHT AWAY IF: °· You have bad chest pain that goes down your arm or into your jaw or neck. °· You feel sweaty, dizzy, or light-headed. °· You have trouble breathing. °· You throw up blood. °· You have trouble or pain when swallowing. °· You have bloody or black poop (stool). °· You have heartburn more than 3 times a week, for more than 2 weeks. °MAKE SURE YOU: °· Understand these instructions. °· Will watch your condition. °· Will get help right away if you are not doing well or get worse. °Document Released: 04/19/2010 Document Revised: 03/22/2013 Document Reviewed: 11/03/2012 °[employer]®  Patient Information ©2015 [employer]. This information is not intended to replace advice given to you by your health care provider. Make sure you discuss any questions you have with your health care provider. ° °

## 2014-11-27 NOTE — MAU Provider Note (Signed)
History     CSN: [id number]  Arrival date and time: 11/27/14 7829   First Provider Initiated Contact with Patient 11/27/14 1846      Chief Complaint  Patient presents with  . Nausea  . Emesis   HPI Ms. Rachel Stuart is a 19 y.o. G3P1011 at [redacted]w[redacted]d who presents to MAU today with complaint of N/V once earlier today after taking Zoloft for the first time. The patient states that she continues to have some nausea without vomiting even after taking Phenergan. She also complains of mid line chest pain that radiates up from the epigastric region. She denies vaginal bleeding, LOF or contractions. She reports good fetal movement.   OB History    Gravida Para Term Preterm AB TAB SAB Ectopic Multiple Living   Obstetric Comments   Pt stated she lost consciousness during labor due to intense pain. After receiving epidural birth went well.       Past Medical History  Diagnosis Date  . Ovarian cyst   . Depression   . Obesity   . Ankle fracture   . Anxiety     Past Surgical History  Procedure Laterality Date  . No past surgeries      Family History  Problem Relation Age of Onset  . Hypertension Maternal Grandmother   . Diabetes Maternal Grandmother   . Diabetes Paternal Grandfather   . Depression Mother   . Mental illness Mother     Social History  Substance Use Topics  . Smoking status: Never Smoker   . Smokeless tobacco: Never Used  . Alcohol Use: No    Allergies:  Allergies  Allergen Reactions  . Amoxicillin Swelling    Lips swell    Prescriptions prior to admission  Medication Sig Dispense Refill Last Dose  . Prenat w/o A Vit-FeFum-FePo-FA (CONCEPT OB) 130-92.4-1 MG CAPS Take 1 tablet by mouth daily. 30 capsule 12 11/27/2014 at 1100  . promethazine (PHENERGAN) 25 MG tablet Take 1 tablet (25 mg total) by mouth every 6 (six) hours as needed for nausea or vomiting. 30 tablet 1   . sertraline (ZOLOFT) 50 MG tablet Take 50 mg by mouth daily.    11/27/2014 at 1100  . acetaminophen (TYLENOL) 325 MG tablet Take 2 tablets (650 mg total) by mouth every 6 (six) hours as needed for moderate pain. (Patient not taking: Reported on 08/04/2014)   Not Taking at Unknown time  . cephALEXin (KEFLEX) 500 MG capsule Take 1 capsule (500 mg total) by mouth 4 (four) times daily. (Patient not taking: Reported on 11/27/2014) 40 capsule 0     Review of Systems  Constitutional: Negative for fever and malaise/fatigue.  Gastrointestinal: Positive for nausea. Negative for vomiting, abdominal pain, diarrhea and constipation.  Genitourinary: Negative for dysuria, urgency and frequency.       Neg - vaginal bleeding, discharge, LOF   Physical Exam   Blood pressure 115/62, pulse 78, temperature 98.4 F (36.9 C), temperature source Oral, resp. rate 18, height  (1.6 m), weight 197 lb (89.359 kg), last menstrual period 05/05/2014, SpO2 97 %.  Physical Exam  Nursing note and vitals reviewed. Constitutional: She is oriented to person, place, and time. She appears well-developed and well-nourished. No distress.  HENT:  Head: Normocephalic and atraumatic.  Cardiovascular: Normal rate.   Respiratory: Effort normal.  GI: Soft. She exhibits no distension and no mass. There is tenderness (mild epigastric tenderness  to palpation). There is no rebound and no guarding.  Neurological: She is alert and oriented to person, place, and time.  Skin: Skin is warm and dry. No erythema.  Psychiatric: She has a normal mood and affect.   Fetal Monitoring: Baseline: 130 bpm, moderate variability, + accelerations, no decelerations Contractions: few contractions, irregular with moderate UI  MAU Course  Procedures None  MDM UA today EKG ordered Pepcid and Zofran ODT given Patient reports significant improvement in nausea. No additional vomiting while in MAU. Epigastric pain has improved as well.   Assessment and Plan  A: SIUP at [redacted]w[redacted]d Nausea and vomiting in  pregnancy Heartburn in pregnancy  P: Discharge home Rx for Pepcid and Zofran given to patient Preterm labor precautions discussed Patient advised to follow-up with [facility] as scheduled for routine prenatal care Patient may return to MAU as needed or if her condition were to change or worsen   Marny Lowenstein, PA-C  11/27/2014, 8:35 PM

## 2014-11-28 LAB — CULTURE, OB URINE

## 2014-12-23 IMAGING — US US OB TRANSVAGINAL
1 series · 13 of 28 positions shown · non-contrast
Comparison: None.

CLINICAL DATA: Back pain.  Evaluate for ectopic pregnancy.
Estimated gestational age by LMP is 5 weeks 2 days.  Quantitative
beta HCG level is 60.

OBSTETRIC <14 WK US AND TRANSVAGINAL OB US
TECHNIQUE: Both transabdominal and transvaginal ultrasound
examinations were performed for complete evaluation of the
gestation as well as the maternal uterus, adnexal regions, and
pelvic cul-de-sac.  Transvaginal technique was performed to assess
early pregnancy.

[Series 1: us ob transvaginal · 13 of 38 slices shown]
[im 2/38]
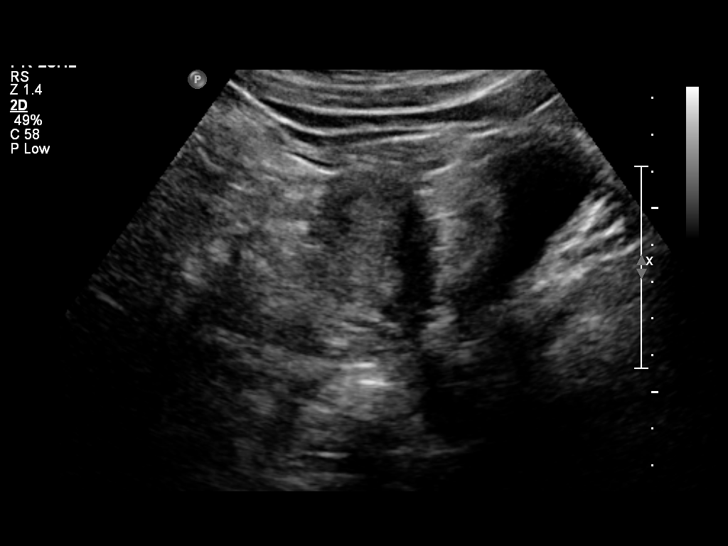
[im 5/38]
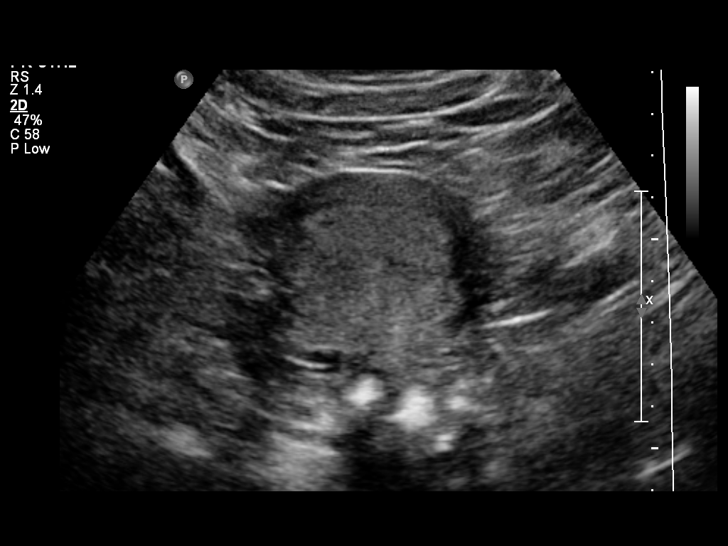
[im 7/38]
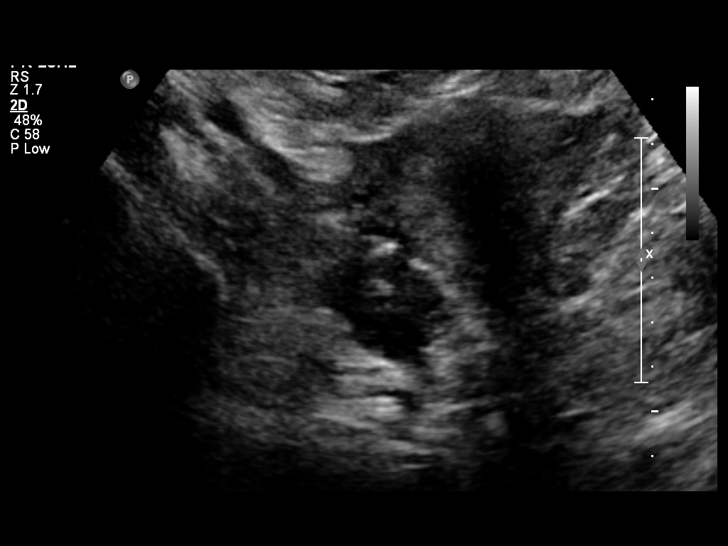
[im 10/38]
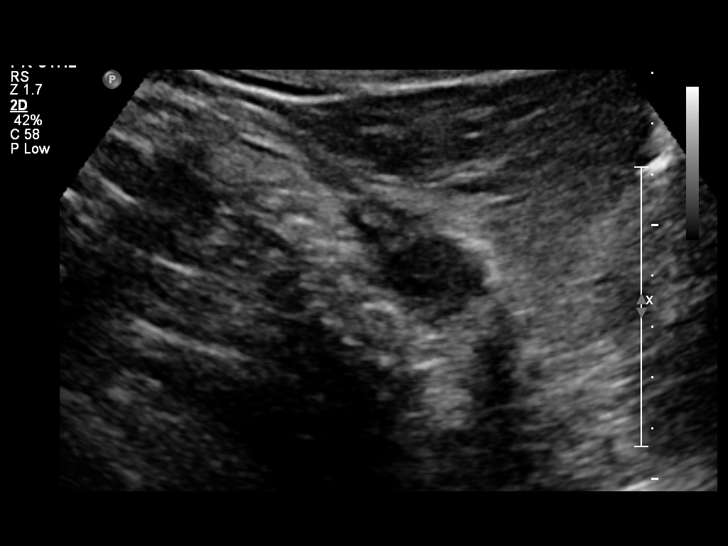
[im 13/38]
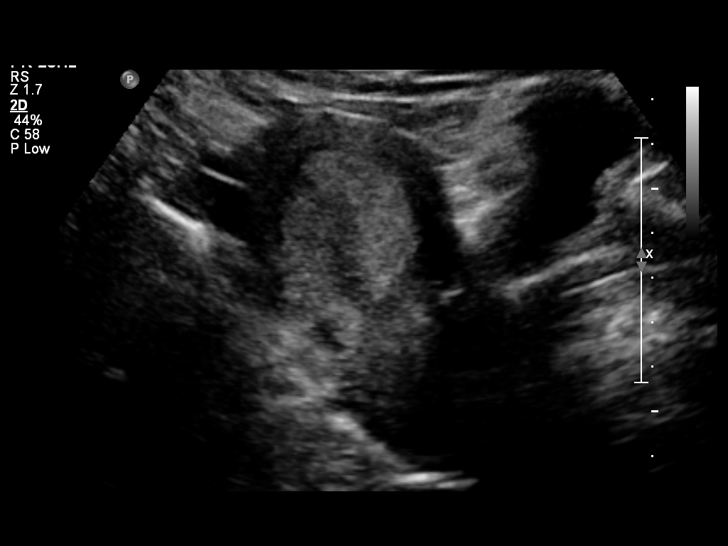
[im 16/38]
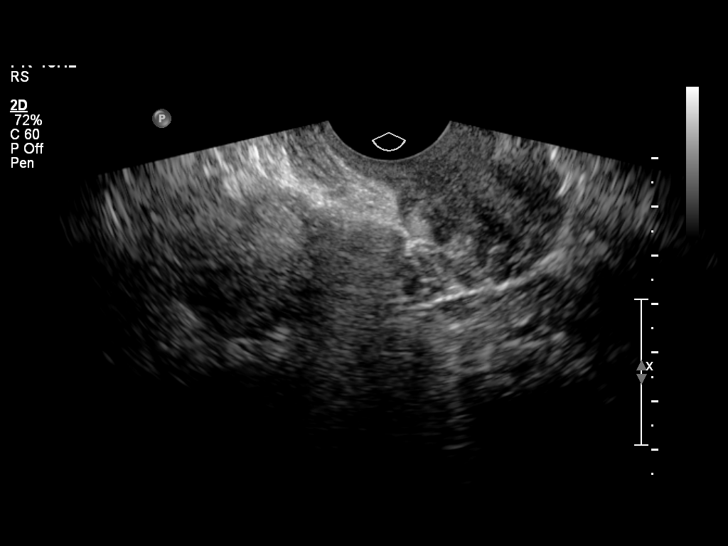
[im 20/38]
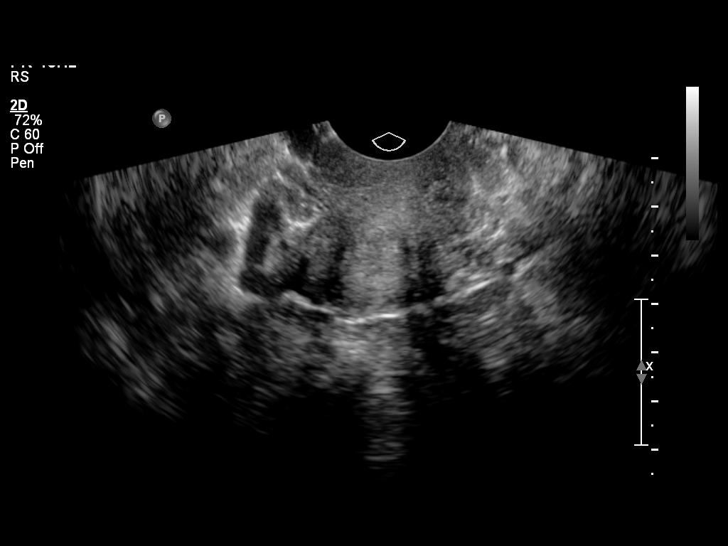
[im 22/38]
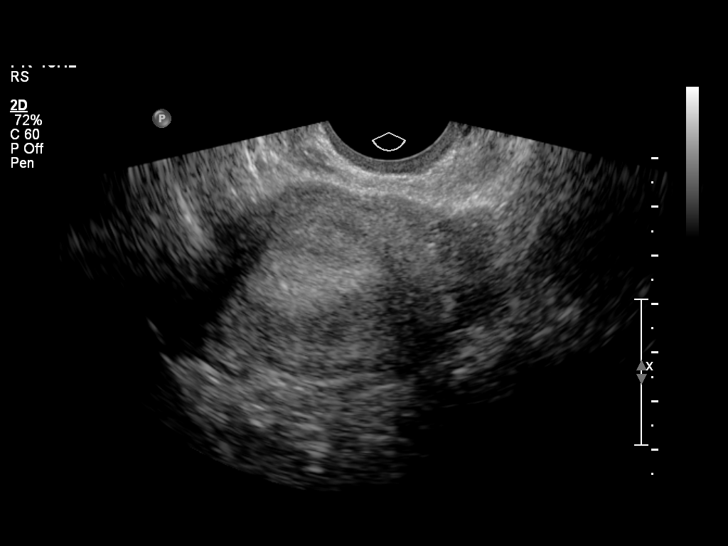
[im 25/38]
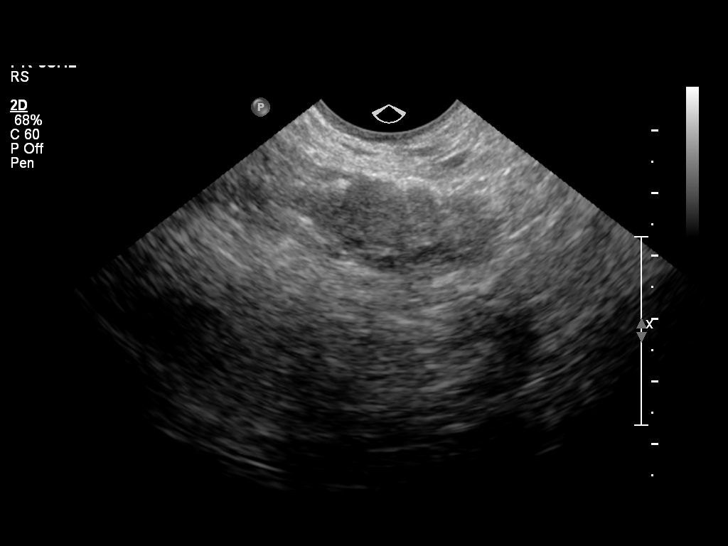
[im 28/38]
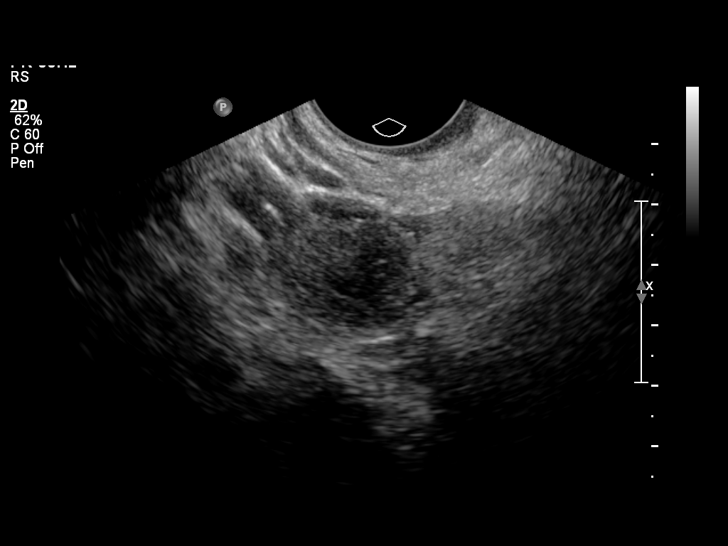
[im 31/38]
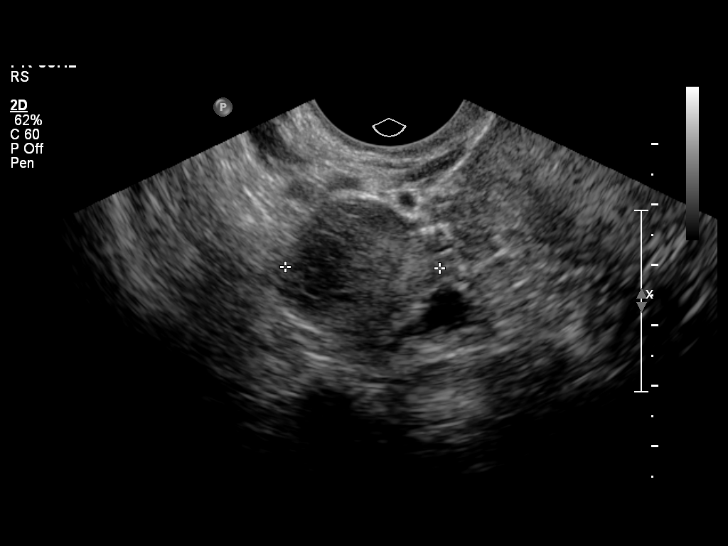
[im 33/38]
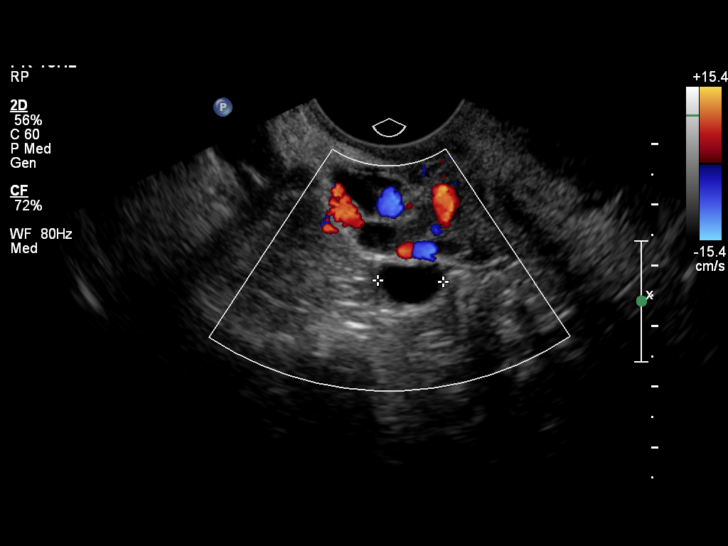
[im 36/38]
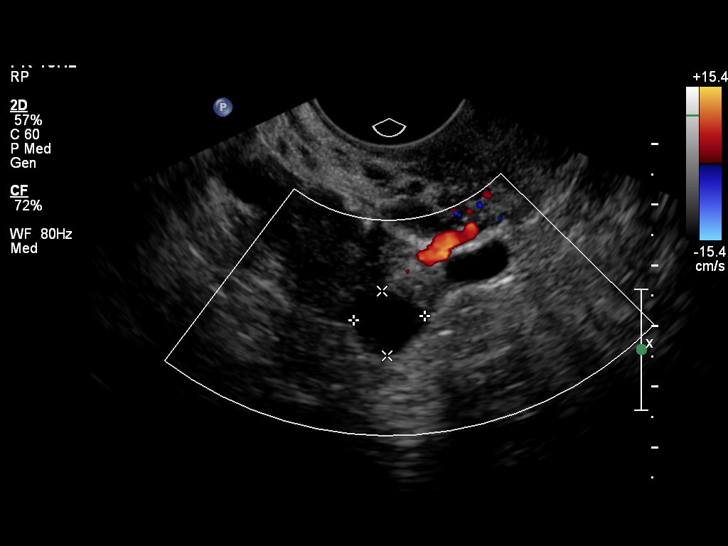

[13 of 28 positions shown; findings below may reference images not displayed]

Intrauterine gestational sac:  None
Yolk sac: None
Embryo: None

Maternal uterus/adnexae:
The uterus is anteverted and normal in size.  Endometrial thickness
is 1.4 cm.  The right ovary measures 2.6 x 2.9 x 2.1 cm and has a
normal appearance.  Probable corpus luteum noted in the right
ovary.  Two small right paraovarian cysts are present, the largest
measuring 1.2 x 1.1 x 1.1 cm.  No suspicious adnexal mass.  No free
pelvic fluid.

The left ovary measures 2.2 x 1.4 x 1.6 cm and has a normal
appearance. No left adnexal mass.
IMPRESSION: No intrauterine pregnancy or suspicious adnexal mass is seen.
Differential includes intrauterine pregnancy too early to
visualize, spontaneous abortion, or occult ectopic pregnancy.
Suggest correlation with serial beta HCG levels, and follow-up
ultrasound as indicated.

## 2015-01-26 LAB — OB RESULTS CONSOLE GBS: STREP GROUP B AG: NEGATIVE

## 2015-02-11 ENCOUNTER — Encounter (HOSPITAL_COMMUNITY): Payer: Self-pay

## 2015-02-11 ENCOUNTER — Inpatient Hospital Stay (HOSPITAL_COMMUNITY)
Admission: AD | Admit: 2015-02-11 | Discharge: 2015-02-11 | Disposition: A | Discharge status: Home / Self Care | Payer: Medicaid Other | Source: Ambulatory Visit | Attending: Obstetrics & Gynecology | Admitting: Obstetrics & Gynecology

## 2015-02-11 DIAGNOSIS — Z3493 Encounter for supervision of normal pregnancy, unspecified, third trimester: Secondary | ICD-10-CM | POA: Diagnosis not present

## 2015-02-11 NOTE — Discharge Instructions (Signed)
Braxton Hicks Contractions °Contractions of the uterus can occur throughout pregnancy. Contractions are not always a sign that you are in labor.  °WHAT ARE BRAXTON HICKS CONTRACTIONS?  °Contractions that occur before labor are called Braxton Hicks contractions, or false labor. Toward the end of pregnancy (32-34 weeks), these contractions can develop more often and may become more forceful. This is not true labor because these contractions do not result in opening (dilatation) and thinning of the cervix. They are sometimes difficult to tell apart from true labor because these contractions can be forceful and people have different pain tolerances. You should not feel embarrassed if you go to the hospital with false labor. Sometimes, the only way to tell if you are in true labor is for your health care provider to look for changes in the cervix. °If there are no prenatal problems or other health problems associated with the pregnancy, it is completely safe to be sent home with false labor and await the onset of true labor. °HOW CAN YOU TELL THE DIFFERENCE BETWEEN TRUE AND FALSE LABOR? °False Labor °· The contractions of false labor are usually shorter and not as hard as those of true labor.   °· The contractions are usually irregular.   °· The contractions are often felt in the front of the lower abdomen and in the groin.   °· The contractions may go away when you walk around or change positions while lying down.   °· The contractions get weaker and are shorter lasting as time goes on.   °· The contractions do not usually become progressively stronger, regular, and closer together as with true labor.   °True Labor °1. Contractions in true labor last 30-70 seconds, become very regular, usually become more intense, and increase in frequency.   °2. The contractions do not go away with walking.   °3. The discomfort is usually felt in the top of the uterus and spreads to the lower abdomen and low back.   °4. True labor can  be determined by your health care provider with an exam. This will show that the cervix is dilating and getting thinner.   °WHAT TO REMEMBER °· Keep up with your usual exercises and follow other instructions given by your health care provider.   °· Take medicines as directed by your health care provider.   °· Keep your regular prenatal appointments.   °· Eat and drink lightly if you think you are going into labor.   °· If Braxton Hicks contractions are making you uncomfortable:   °· Change your position from lying down or resting to walking, or from walking to resting.   °· Sit and rest in a tub of warm water.   °· Drink 2-3 glasses of water. Dehydration may cause these contractions.   °· Do slow and deep breathing several times an hour.   °WHEN SHOULD I SEEK IMMEDIATE MEDICAL CARE? °Seek immediate medical care if: °· Your contractions become stronger, more regular, and closer together.   °· You have fluid leaking or gushing from your vagina.   °· You have a fever.   °· You pass blood-tinged mucus.   °· You have vaginal bleeding.   °· You have continuous abdominal pain.   °· You have low back pain that you never had before.   °· You feel your baby's head pushing down and causing pelvic pressure.   °· Your baby is not moving as much as it used to.   °  °This information is not intended to replace advice given to you by your health care provider. Make sure you discuss any questions you have with your health care   provider. °  °Document Released: 03/17/2005 Document Revised: 03/22/2013 Document Reviewed: 12/27/2012 °Elsevier Interactive Patient Education ©2016 [employer] ° °Fetal Movement Counts °Patient Name: __________________________________________________ Patient Due Date: ____________________ °Performing a fetal movement count is highly recommended in high-risk pregnancies, but it is good for every pregnant woman to do. Your health care provider may ask you to start counting fetal movements at 28 weeks of the  pregnancy. Fetal movements often increase: °· After eating a full meal. °· After physical activity. °· After eating or drinking something sweet or cold. °· At rest. °Pay attention to when you feel the baby is most active. This will help you notice a pattern of your baby's sleep and wake cycles and what factors contribute to an increase in fetal movement. It is important to perform a fetal movement count at the same time each day when your baby is normally most active.  °HOW TO COUNT FETAL MOVEMENTS °5. Find a quiet and comfortable area to sit or lie down on your left side. Lying on your left side provides the best blood and oxygen circulation to your baby. °6. Write down the day and time on a sheet of paper or in a journal. °7. Start counting kicks, flutters, swishes, rolls, or jabs in a 2-hour period. You should feel at least 10 movements within 2 hours. °8. If you do not feel 10 movements in 2 hours, wait 2-3 hours and count again. Look for a change in the pattern or not enough counts in 2 hours. °SEEK MEDICAL CARE IF: °· You feel less than 10 counts in 2 hours, tried twice. °· There is no movement in over an hour. °· The pattern is changing or taking longer each day to reach 10 counts in 2 hours. °· You feel the baby is not moving as he or she usually does. °Date: ____________ Movements: ____________ Start time: ____________ Finish time: ____________  °Date: ____________ Movements: ____________ Start time: ____________ Finish time: ____________ °Date: ____________ Movements: ____________ Start time: ____________ Finish time: ____________ °Date: ____________ Movements: ____________ Start time: ____________ Finish time: ____________ °Date: ____________ Movements: ____________ Start time: ____________ Finish time: ____________ °Date: ____________ Movements: ____________ Start time: ____________ Finish time: ____________ °Date: ____________ Movements: ____________ Start time: ____________ Finish time:  ____________ °Date: ____________ Movements: ____________ Start time: ____________ Finish time: ____________  °Date: ____________ Movements: ____________ Start time: ____________ Finish time: ____________ °Date: ____________ Movements: ____________ Start time: ____________ Finish time: ____________ °Date: ____________ Movements: ____________ Start time: ____________ Finish time: ____________ °Date: ____________ Movements: ____________ Start time: ____________ Finish time: ____________ °Date: ____________ Movements: ____________ Start time: ____________ Finish time: ____________ °Date: ____________ Movements: ____________ Start time: ____________ Finish time: ____________ °Date: ____________ Movements: ____________ Start time: ____________ Finish time: ____________  °Date: ____________ Movements: ____________ Start time: ____________ Finish time: ____________ °Date: ____________ Movements: ____________ Start time: ____________ Finish time: ____________ °Date: ____________ Movements: ____________ Start time: ____________ Finish time: ____________ °Date: ____________ Movements: ____________ Start time: ____________ Finish time: ____________ °Date: ____________ Movements: ____________ Start time: ____________ Finish time: ____________ °Date: ____________ Movements: ____________ Start time: ____________ Finish time: ____________ °Date: ____________ Movements: ____________ Start time: ____________ Finish time: ____________  °Date: ____________ Movements: ____________ Start time: ____________ Finish time: ____________ °Date: ____________ Movements: ____________ Start time: ____________ Finish time: ____________ °Date: ____________ Movements: ____________ Start time: ____________ Finish time: ____________ °Date: ____________ Movements: ____________ Start time: ____________ Finish time: ____________ °Date: ____________ Movements: ____________ Start time: ____________ Finish time: ____________ °Date: ____________ Movements:  ____________ Start time: ____________ Finish   time: ____________ °Date: ____________ Movements: ____________ Start time: ____________ Finish time: ____________  °Date: ____________ Movements: ____________ Start time: ____________ Finish time: ____________ °Date: ____________ Movements: ____________ Start time: ____________ Finish time: ____________ °Date: ____________ Movements: ____________ Start time: ____________ Finish time: ____________ °Date: ____________ Movements: ____________ Start time: ____________ Finish time: ____________ °Date: ____________ Movements: ____________ Start time: ____________ Finish time: ____________ °Date: ____________ Movements: ____________ Start time: ____________ Finish time: ____________ °Date: ____________ Movements: ____________ Start time: ____________ Finish time: ____________  °Date: ____________ Movements: ____________ Start time: ____________ Finish time: ____________ °Date: ____________ Movements: ____________ Start time: ____________ Finish time: ____________ °Date: ____________ Movements: ____________ Start time: ____________ Finish time: ____________ °Date: ____________ Movements: ____________ Start time: ____________ Finish time: ____________ °Date: ____________ Movements: ____________ Start time: ____________ Finish time: ____________ °Date: ____________ Movements: ____________ Start time: ____________ Finish time: ____________ °Date: ____________ Movements: ____________ Start time: ____________ Finish time: ____________  °Date: ____________ Movements: ____________ Start time: ____________ Finish time: ____________ °Date: ____________ Movements: ____________ Start time: ____________ Finish time: ____________ °Date: ____________ Movements: ____________ Start time: ____________ Finish time: ____________ °Date: ____________ Movements: ____________ Start time: ____________ Finish time: ____________ °Date: ____________ Movements: ____________ Start time: ____________ Finish  time: ____________ °Date: ____________ Movements: ____________ Start time: ____________ Finish time: ____________ °Date: ____________ Movements: ____________ Start time: ____________ Finish time: ____________  °Date: ____________ Movements: ____________ Start time: ____________ Finish time: ____________ °Date: ____________ Movements: ____________ Start time: ____________ Finish time: ____________ °Date: ____________ Movements: ____________ Start time: ____________ Finish time: ____________ °Date: ____________ Movements: ____________ Start time: ____________ Finish time: ____________ °Date: ____________ Movements: ____________ Start time: ____________ Finish time: ____________ °Date: ____________ Movements: ____________ Start time: ____________ Finish time: ____________ °  °This information is not intended to replace advice given to you by your health care provider. Make sure you discuss any questions you have with your health care provider. °  °Document Released: 04/16/2006 Document Revised: 04/07/2014 Document Reviewed: 01/12/2012 °Elsevier Interactive Patient Education ©2016 [employer] ° °

## 2015-02-11 NOTE — MAU Note (Signed)
Pt c/o contractions that started at 1830 every 5-7 mins. Denies LOF or vag bleeding. +FM.

## 2015-02-11 NOTE — MAU Note (Signed)
Recheck patient cervix in 1 hour

## 2015-02-12 ENCOUNTER — Telehealth (HOSPITAL_COMMUNITY): Payer: Self-pay | Admitting: *Deleted

## 2015-02-12 ENCOUNTER — Encounter (HOSPITAL_COMMUNITY): Payer: Self-pay | Admitting: *Deleted

## 2015-02-12 NOTE — Telephone Encounter (Signed)
Preadmission screen  

## 2015-02-13 ENCOUNTER — Encounter (HOSPITAL_COMMUNITY): Payer: Self-pay | Admitting: *Deleted

## 2015-02-14 ENCOUNTER — Inpatient Hospital Stay (HOSPITAL_COMMUNITY)
Admission: AD | Admit: 2015-02-14 | Discharge: 2015-02-16 | DRG: 775 | Disposition: A | Discharge status: Home / Self Care | Payer: Medicaid Other | Source: Ambulatory Visit | Attending: Obstetrics and Gynecology | Admitting: Obstetrics and Gynecology

## 2015-02-14 ENCOUNTER — Inpatient Hospital Stay (HOSPITAL_COMMUNITY): Payer: Medicaid Other | Admitting: Anesthesiology

## 2015-02-14 ENCOUNTER — Encounter (HOSPITAL_COMMUNITY): Payer: Self-pay | Admitting: *Deleted

## 2015-02-14 DIAGNOSIS — O9989 Other specified diseases and conditions complicating pregnancy, childbirth and the puerperium: Secondary | ICD-10-CM

## 2015-02-14 DIAGNOSIS — Z8249 Family history of ischemic heart disease and other diseases of the circulatory system: Secondary | ICD-10-CM

## 2015-02-14 DIAGNOSIS — F329 Major depressive disorder, single episode, unspecified: Secondary | ICD-10-CM | POA: Diagnosis present

## 2015-02-14 DIAGNOSIS — Z6835 Body mass index (BMI) 35.0-35.9, adult: Secondary | ICD-10-CM | POA: Diagnosis not present

## 2015-02-14 DIAGNOSIS — Z833 Family history of diabetes mellitus: Secondary | ICD-10-CM | POA: Diagnosis not present

## 2015-02-14 DIAGNOSIS — E669 Obesity, unspecified: Secondary | ICD-10-CM | POA: Diagnosis present

## 2015-02-14 DIAGNOSIS — O99214 Obesity complicating childbirth: Secondary | ICD-10-CM | POA: Diagnosis present

## 2015-02-14 DIAGNOSIS — Z823 Family history of stroke: Secondary | ICD-10-CM

## 2015-02-14 DIAGNOSIS — Z3A40 40 weeks gestation of pregnancy: Secondary | ICD-10-CM

## 2015-02-14 DIAGNOSIS — O26893 Other specified pregnancy related conditions, third trimester: Secondary | ICD-10-CM | POA: Diagnosis present

## 2015-02-14 DIAGNOSIS — O99344 Other mental disorders complicating childbirth: Principal | ICD-10-CM | POA: Diagnosis present

## 2015-02-14 DIAGNOSIS — Z8659 Personal history of other mental and behavioral disorders: Secondary | ICD-10-CM

## 2015-02-14 DIAGNOSIS — Z8759 Personal history of other complications of pregnancy, childbirth and the puerperium: Secondary | ICD-10-CM

## 2015-02-14 DIAGNOSIS — IMO0001 Reserved for inherently not codable concepts without codable children: Secondary | ICD-10-CM

## 2015-02-14 LAB — CBC
HEMATOCRIT: 34.1 % — AB (ref 36.0–46.0)
Hemoglobin: 11.7 g/dL — ABNORMAL LOW (ref 12.0–15.0)
MCH: 28.7 pg (ref 26.0–34.0)
MCHC: 34.3 g/dL (ref 30.0–36.0)
MCV: 83.8 fL (ref 78.0–100.0)
Platelets: 252 10*3/uL (ref 150–400)
RBC: 4.07 MIL/uL (ref 3.87–5.11)
RDW: 13 % (ref 11.5–15.5)
WBC: 9.5 10*3/uL (ref 4.0–10.5)

## 2015-02-14 LAB — TYPE AND SCREEN
ABO/RH(D): A POS
ANTIBODY SCREEN: NEGATIVE

## 2015-02-14 LAB — RAPID URINE DRUG SCREEN, HOSP PERFORMED
Amphetamines: NOT DETECTED
BENZODIAZEPINES: NOT DETECTED
Barbiturates: NOT DETECTED
Cocaine: NOT DETECTED
Opiates: NOT DETECTED
TETRAHYDROCANNABINOL: NOT DETECTED

## 2015-02-14 MED ORDER — ACETAMINOPHEN 325 MG PO TABS: 650.0000 mg | ORAL_TABLET | ORAL | Status: DC | PRN

## 2015-02-14 MED ORDER — LIDOCAINE HCL (PF) 1 % IJ SOLN
INTRAMUSCULAR | Status: DC | PRN
  Administered 2015-02-14: 8 mL via EPIDURAL
  Administered 2015-02-14: 6 mL via EPIDURAL

## 2015-02-14 MED ORDER — OXYTOCIN 40 UNITS IN LACTATED RINGERS INFUSION - SIMPLE MED
1.0000 m[IU]/min | INTRAVENOUS | Status: DC
  Administered 2015-02-14: 2 m[IU]/min via INTRAVENOUS

## 2015-02-14 MED ORDER — OXYTOCIN BOLUS FROM INFUSION
500.0000 mL | INTRAVENOUS | Status: DC
  Administered 2015-02-15: 500 mL via INTRAVENOUS

## 2015-02-14 MED ORDER — LIDOCAINE HCL (PF) 1 % IJ SOLN
30.0000 mL | INTRAMUSCULAR | Status: DC | PRN
  Filled 2015-02-14: qty 30

## 2015-02-14 MED ORDER — LACTATED RINGERS IV SOLN
500.0000 mL | INTRAVENOUS | Status: DC | PRN
  Administered 2015-02-14: 500 mL via INTRAVENOUS

## 2015-02-14 MED ORDER — ONDANSETRON HCL 4 MG/2ML IJ SOLN: 4.0000 mg | Freq: Four times a day (QID) | INTRAMUSCULAR | Status: DC | PRN

## 2015-02-14 MED ORDER — ZOLPIDEM TARTRATE 5 MG PO TABS: 5.0000 mg | ORAL_TABLET | Freq: Every evening | ORAL | Status: DC | PRN

## 2015-02-14 MED ORDER — TERBUTALINE SULFATE 1 MG/ML IJ SOLN
0.2500 mg | Freq: Once | INTRAMUSCULAR | Status: DC | PRN
  Filled 2015-02-14: qty 1

## 2015-02-14 MED ORDER — PHENYLEPHRINE 40 MCG/ML (10ML) SYRINGE FOR IV PUSH (FOR BLOOD PRESSURE SUPPORT)
80.0000 ug | PREFILLED_SYRINGE | INTRAVENOUS | Status: DC | PRN
  Filled 2015-02-14: qty 20
  Filled 2015-02-14: qty 2

## 2015-02-14 MED ORDER — CITRIC ACID-SODIUM CITRATE 334-500 MG/5ML PO SOLN: 30.0000 mL | ORAL | Status: DC | PRN

## 2015-02-14 MED ORDER — OXYCODONE-ACETAMINOPHEN 5-325 MG PO TABS: 2.0000 | ORAL_TABLET | ORAL | Status: DC | PRN

## 2015-02-14 MED ORDER — FENTANYL 2.5 MCG/ML BUPIVACAINE 1/10 % EPIDURAL INFUSION (WH - ANES)
14.0000 mL/h | INTRAMUSCULAR | Status: DC | PRN
  Administered 2015-02-14 (×2): 14 mL/h via EPIDURAL
  Filled 2015-02-14: qty 125

## 2015-02-14 MED ORDER — FENTANYL CITRATE (PF) 100 MCG/2ML IJ SOLN: 100.0000 ug | INTRAMUSCULAR | Status: DC | PRN

## 2015-02-14 MED ORDER — OXYTOCIN 40 UNITS IN LACTATED RINGERS INFUSION - SIMPLE MED
62.5000 mL/h | INTRAVENOUS | Status: DC
  Filled 2015-02-14: qty 1000

## 2015-02-14 MED ORDER — DIPHENHYDRAMINE HCL 50 MG/ML IJ SOLN: 12.5000 mg | INTRAMUSCULAR | Status: DC | PRN

## 2015-02-14 MED ORDER — OXYCODONE-ACETAMINOPHEN 5-325 MG PO TABS: 1.0000 | ORAL_TABLET | ORAL | Status: DC | PRN

## 2015-02-14 MED ORDER — LACTATED RINGERS IV SOLN
INTRAVENOUS | Status: DC
  Administered 2015-02-14 (×2): via INTRAVENOUS

## 2015-02-14 MED ORDER — SERTRALINE HCL 50 MG PO TABS
50.0000 mg | ORAL_TABLET | Freq: Every day | ORAL | Status: DC
  Filled 2015-02-14: qty 1

## 2015-02-14 MED ORDER — EPHEDRINE 5 MG/ML INJ
10.0000 mg | INTRAVENOUS | Status: DC | PRN
  Filled 2015-02-14: qty 2

## 2015-02-14 NOTE — Anesthesia Procedure Notes (Signed)
Epidural Patient location during procedure: OB Start time: 02/14/2015 9:48 PM End time: 02/14/2015 9:52 PM  Staffing Anesthesiologist: Leilani AbleHATCHETT, Ramin Zoll Performed by: anesthesiologist   Preanesthetic Checklist Completed: patient identified, surgical consent, pre-op evaluation, timeout performed, IV checked, risks and benefits discussed and monitors and equipment checked  Epidural Patient position: sitting Prep: site prepped and draped and DuraPrep Patient monitoring: continuous pulse ox and blood pressure Approach: midline Location: L3-L4 Injection technique: LOR air  Needle:  Needle type: Tuohy  Needle gauge: 17 G Needle length: 9 cm and 9 Needle insertion depth: 6 cm Catheter type: closed end flexible Catheter size: 19 Gauge Catheter at skin depth: 10 cm Test dose: negative and Other  Assessment Sensory level: T9 Events: blood not aspirated, injection not painful, no injection resistance, negative IV test and no paresthesia  Additional Notes Reason for block:procedure for pain

## 2015-02-14 NOTE — Anesthesia Preprocedure Evaluation (Signed)
Anesthesia Evaluation  Patient identified by MRN, date of birth, ID band Patient awake    Reviewed: Allergy & Precautions, H&P , Patient's Chart, lab work & pertinent test results  Airway Mallampati: I  TM Distance: >3 FB Neck ROM: full    Dental no notable dental hx.    Pulmonary neg pulmonary ROS,    Pulmonary exam normal        Cardiovascular negative cardio ROS Normal cardiovascular exam     Neuro/Psych negative neurological ROS     GI/Hepatic negative GI ROS, Neg liver ROS,   Endo/Other  negative endocrine ROS  Renal/GU negative Renal ROS     Musculoskeletal   Abdominal (+) + obese,   Peds  Hematology negative hematology ROS (+)   Anesthesia Other Findings   Reproductive/Obstetrics (+) Pregnancy                             Anesthesia Physical Anesthesia Plan  ASA: II  Anesthesia Plan: Epidural   Post-op Pain Management:    Induction:   Airway Management Planned:   Additional Equipment:   Intra-op Plan:   Post-operative Plan:   Informed Consent: I have reviewed the patients History and Physical, chart, labs and discussed the procedure including the risks, benefits and alternatives for the proposed anesthesia with the patient or authorized representative who has indicated his/her understanding and acceptance.     Plan Discussed with:   Anesthesia Plan Comments:         Anesthesia Quick Evaluation

## 2015-02-14 NOTE — MAU Note (Signed)
Patient states contractions about 4:30 this afternoon. No LOF. Has noticed bleeding with wiping since about noon.  Some decreased movement noted.

## 2015-02-14 NOTE — H&P (Signed)
Rachel Stuart is a 19 y.o. female [id number] @ 40.5wks presenting for eval of ctx since this afternoon @ 1630. Denies leaking or bldg. Reports +FM. No N/V/D. [facility] and has been remarkable for 1) obesity 2) +THC in early preg 3) depression (Zoloft) 4) GBS neg History OB History    Gravida Para Term Preterm AB TAB SAB Ectopic Multiple Living   3 1 1  1  1   1       Obstetric Comments   Pt stated she lost consciousness during labor due to intense pain. After receiving epidural birth went well.      Past Medical History  Diagnosis Date  . Ovarian cyst   . Obesity   . Ankle fracture   . Anxiety   . Depression     zoloft   Past Surgical History  Procedure Laterality Date  . No past surgeries     Family History: family history includes Cancer in her maternal grandmother; Depression in her maternal grandmother and mother; Diabetes in her maternal grandmother and paternal grandfather; Hypertension in her maternal grandmother; Mental illness in her mother; Stroke in her maternal grandmother. Social History:  reports that she has never smoked. She has never used smokeless tobacco. She reports that she does not drink alcohol or use illicit drugs.   Prenatal Transfer Tool  Maternal Diabetes: No Genetic Screening: Normal Maternal Ultrasounds/Referrals: Normal Fetal Ultrasounds or other Referrals:  None Maternal Substance Abuse:  No Significant Maternal Medications:  Meds include: Zoloft Significant Maternal Lab Results:  Lab values include: Group B Strep negative Other Comments:  None  ROS  Dilation: 4.5 Effacement (%): Thick Station: -2 Exam by:: Cammy CopaJennifer Lineberry, RN Blood pressure 117/66, pulse 88, temperature 98.7 F (37.1 C), temperature source Tympanic, resp. rate 18, height 5\' 3"  (1.6 m), weight 89.359 kg (197 lb), last menstrual period 05/05/2014, SpO2 100 %. Exam Physical Exam  Constitutional: She is oriented to person, place, and time. She  appears well-developed.  HENT:  Head: Normocephalic.  Neck: Normal range of motion.  Cardiovascular: Normal rate.   Respiratory: Effort normal.  GI:  EFM 140s, +accels, no decels Ctx q 3-5 mins  Musculoskeletal: Normal range of motion.  Neurological: She is alert and oriented to person, place, and time.  Skin: Skin is warm and dry.  Psychiatric: She has a normal mood and affect. Her behavior is normal. Thought content normal.    Prenatal labs: ABO, Rh: A/Positive/-- (04/04 0000) Antibody: Negative (04/04 0000) Rubella: Immune (04/04 0000) RPR: Nonreactive (04/04 0000)  HBsAg: Negative (04/04 0000)  HIV: Non-reactive (04/04 0000)  GBS: Negative (10/28 0000)   Assessment/Plan: IUP@40 .5wks Latent phase GBS neg  [facility] management Continue Zoloft UDS pending Anticipate SVD   SHAW, KIMBERLY CNM 02/14/2015, 9:03 PM

## 2015-02-15 ENCOUNTER — Encounter (HOSPITAL_COMMUNITY): Payer: Self-pay | Admitting: *Deleted

## 2015-02-15 DIAGNOSIS — O99214 Obesity complicating childbirth: Secondary | ICD-10-CM

## 2015-02-15 DIAGNOSIS — Z3A40 40 weeks gestation of pregnancy: Secondary | ICD-10-CM

## 2015-02-15 DIAGNOSIS — F329 Major depressive disorder, single episode, unspecified: Secondary | ICD-10-CM

## 2015-02-15 DIAGNOSIS — O99344 Other mental disorders complicating childbirth: Secondary | ICD-10-CM

## 2015-02-15 LAB — RPR: RPR Ser Ql: NONREACTIVE

## 2015-02-15 MED ORDER — IBUPROFEN 600 MG PO TABS
600.0000 mg | ORAL_TABLET | Freq: Four times a day (QID) | ORAL | Status: DC
  Administered 2015-02-15 – 2015-02-16 (×6): 600 mg via ORAL
  Filled 2015-02-15 (×7): qty 1

## 2015-02-15 MED ORDER — SIMETHICONE 80 MG PO CHEW: 80.0000 mg | CHEWABLE_TABLET | ORAL | Status: DC | PRN

## 2015-02-15 MED ORDER — OXYCODONE-ACETAMINOPHEN 5-325 MG PO TABS
2.0000 | ORAL_TABLET | ORAL | Status: DC | PRN
  Administered 2015-02-16 (×2): 2 via ORAL
  Filled 2015-02-15 (×2): qty 2

## 2015-02-15 MED ORDER — ACETAMINOPHEN 325 MG PO TABS
650.0000 mg | ORAL_TABLET | ORAL | Status: DC | PRN
  Administered 2015-02-15: 650 mg via ORAL
  Filled 2015-02-15: qty 2

## 2015-02-15 MED ORDER — DIBUCAINE 1 % RE OINT: 1.0000 "application " | TOPICAL_OINTMENT | RECTAL | Status: DC | PRN

## 2015-02-15 MED ORDER — ONDANSETRON HCL 4 MG PO TABS: 4.0000 mg | ORAL_TABLET | ORAL | Status: DC | PRN

## 2015-02-15 MED ORDER — SENNOSIDES-DOCUSATE SODIUM 8.6-50 MG PO TABS
2.0000 | ORAL_TABLET | ORAL | Status: DC
  Administered 2015-02-16: 2 via ORAL
  Filled 2015-02-15: qty 2

## 2015-02-15 MED ORDER — TETANUS-DIPHTH-ACELL PERTUSSIS 5-2.5-18.5 LF-MCG/0.5 IM SUSP: 0.5000 mL | Freq: Once | INTRAMUSCULAR | Status: DC

## 2015-02-15 MED ORDER — PRENATAL MULTIVITAMIN CH
1.0000 | ORAL_TABLET | Freq: Every day | ORAL | Status: DC
  Administered 2015-02-15 – 2015-02-16 (×2): 1 via ORAL
  Filled 2015-02-15 (×2): qty 1

## 2015-02-15 MED ORDER — WITCH HAZEL-GLYCERIN EX PADS: 1.0000 "application " | MEDICATED_PAD | CUTANEOUS | Status: DC | PRN

## 2015-02-15 MED ORDER — MISOPROSTOL 200 MCG PO TABS
ORAL_TABLET | ORAL | Status: AC
  Filled 2015-02-15: qty 4

## 2015-02-15 MED ORDER — OXYCODONE-ACETAMINOPHEN 5-325 MG PO TABS: 1.0000 | ORAL_TABLET | ORAL | Status: DC | PRN

## 2015-02-15 MED ORDER — BENZOCAINE-MENTHOL 20-0.5 % EX AERO
1.0000 "application " | INHALATION_SPRAY | CUTANEOUS | Status: DC | PRN
  Administered 2015-02-15: 1 via TOPICAL
  Filled 2015-02-15: qty 56

## 2015-02-15 MED ORDER — ONDANSETRON HCL 4 MG/2ML IJ SOLN: 4.0000 mg | INTRAMUSCULAR | Status: DC | PRN

## 2015-02-15 MED ORDER — ZOLPIDEM TARTRATE 5 MG PO TABS: 5.0000 mg | ORAL_TABLET | Freq: Every evening | ORAL | Status: DC | PRN

## 2015-02-15 MED ORDER — DIPHENHYDRAMINE HCL 25 MG PO CAPS: 25.0000 mg | ORAL_CAPSULE | Freq: Four times a day (QID) | ORAL | Status: DC | PRN

## 2015-02-15 MED ORDER — MISOPROSTOL 200 MCG PO TABS
800.0000 ug | ORAL_TABLET | Freq: Once | ORAL | Status: AC
Start: 2015-02-15 — End: 2015-02-15
  Administered 2015-02-15: 800 ug via ORAL

## 2015-02-15 MED ORDER — LANOLIN HYDROUS EX OINT: TOPICAL_OINTMENT | CUTANEOUS | Status: DC | PRN

## 2015-02-15 NOTE — Progress Notes (Signed)
Kim shaw notified of temp 101.8   Will monitor   No new orders

## 2015-02-15 NOTE — Progress Notes (Signed)
  Admission nutrition screen triggered for unintentional weight loss > 10 lbs within the last month.  Patients chart reviewed and assessed  for nutritional risk.Weights prior to delivery have been relatively stable. Pt was followed by a nutritionist during her pregnancy for weight loss. Patient is determined to be at low nutrition  risk.   Elisabeth CaraKatherine Jaxin Fulfer M.Odis LusterEd. R.D. LDN Neonatal Nutrition Support Specialist/RD III Pager [phone]      Phone [phone]

## 2015-02-15 NOTE — Anesthesia Postprocedure Evaluation (Signed)
Anesthesia Post Note  Patient: Rachel Stuart  Procedure(s) Performed: * No procedures listed *  Anesthesia type: Epidural  Patient location: Mother/Baby  Post pain: Pain level controlled  Post assessment: Post-op Vital signs reviewed  Last Vitals:  Filed Vitals:   02/15/15 0700  BP:   Pulse:   Temp: 37.6 C  Resp:     Post vital signs: Reviewed  Level of consciousness:alert  Complications: No apparent anesthesia complications

## 2015-02-15 NOTE — Progress Notes (Signed)
UR chart review completed.  

## 2015-02-16 ENCOUNTER — Inpatient Hospital Stay (HOSPITAL_COMMUNITY): Admission: RE | Admit: 2015-02-16 | Payer: Medicaid Other | Source: Ambulatory Visit

## 2015-02-16 DIAGNOSIS — O9989 Other specified diseases and conditions complicating pregnancy, childbirth and the puerperium: Secondary | ICD-10-CM

## 2015-02-16 DIAGNOSIS — Z8759 Personal history of other complications of pregnancy, childbirth and the puerperium: Secondary | ICD-10-CM

## 2015-02-16 DIAGNOSIS — Z8659 Personal history of other mental and behavioral disorders: Secondary | ICD-10-CM

## 2015-02-16 MED ORDER — IBUPROFEN 600 MG PO TABS: 600.0000 mg | ORAL_TABLET | Freq: Four times a day (QID) | ORAL | Status: DC

## 2015-02-16 NOTE — Discharge Instructions (Signed)
Vaginal Delivery, Care After °Refer to this sheet in the next few weeks. These discharge instructions provide you with information on caring for yourself after delivery. Your health care provider may also give you specific instructions. Your treatment has been planned according to the most current medical practices available, but problems sometimes occur. Call your health care provider if you have any problems or questions after you go home. °HOME CARE INSTRUCTIONS °· Take over-the-counter or prescription medicines only as directed by your health care provider or pharmacist. °· Do not drink alcohol, especially if you are breastfeeding or taking medicine to relieve pain. °· Do not chew or smoke tobacco. °· Do not use illegal drugs. °· Continue to use good perineal care. Good perineal care includes: °¨ Wiping your perineum from front to back. °¨ Keeping your perineum clean. °· Do not use tampons or douche until your health care provider says it is okay. °· Shower, wash your hair, and take tub baths as directed by your health care provider. °· Wear a well-fitting bra that provides breast support. °· Eat healthy foods. °· Drink enough fluids to keep your urine clear or pale yellow. °· Eat high-fiber foods such as whole grain cereals and breads, brown rice, beans, and fresh fruits and vegetables every day. These foods may help prevent or relieve constipation. °· Follow your health care provider's recommendations regarding resumption of activities such as climbing stairs, driving, lifting, exercising, or traveling. °· Talk to your health care provider about resuming sexual activities. Resumption of sexual activities is dependent upon your risk of infection, your rate of healing, and your comfort and desire to resume sexual activity. °· Try to have someone help you with your household activities and your newborn for at least a few days after you leave the hospital. °· Rest as much as possible. Try to rest or take a nap  when your newborn is sleeping. °· Increase your activities gradually. °· Keep all of your scheduled postpartum appointments. It is very important to keep your scheduled follow-up appointments. At these appointments, your health care provider will be checking to make sure that you are healing physically and emotionally. °SEEK MEDICAL CARE IF:  °· You are passing large clots from your vagina. Save any clots to show your health care provider. °· You have a foul smelling discharge from your vagina. °· You have trouble urinating. °· You are urinating frequently. °· You have pain when you urinate. °· You have a change in your bowel movements. °· You have increasing redness, pain, or swelling near your vaginal incision (episiotomy) or vaginal tear. °· You have pus draining from your episiotomy or vaginal tear. °· Your episiotomy or vaginal tear is separating. °· You have painful, hard, or reddened breasts. °· You have a severe headache. °· You have blurred vision or see spots. °· You feel sad or depressed. °· You have thoughts of hurting yourself or your newborn. °· You have questions about your care, the care of your newborn, or medicines. °· You are dizzy or light-headed. °· You have a rash. °· You have nausea or vomiting. °· You were breastfeeding and have not had a menstrual period within 12 weeks after you stopped breastfeeding. °· You are not breastfeeding and have not had a menstrual period by the 12th week after delivery. °· You have a fever. °SEEK IMMEDIATE MEDICAL CARE IF:  °· You have persistent pain. °· You have chest pain. °· You have shortness of breath. °· You faint. °· You   have leg pain. °· You have stomach pain. °· Your vaginal bleeding saturates two or more sanitary pads in 1 hour. °  °This information is not intended to replace advice given to you by your health care provider. Make sure you discuss any questions you have with your health care provider. °  °Document Released: 03/14/2000 Document Revised:  12/06/2014 Document Reviewed: 11/12/2011 °Elsevier Interactive Patient Education ©2016 Elsevier Inc. ° °Postpartum Depression and Baby Blues °The postpartum period begins right after the birth of a baby. During this time, there is often a great amount of joy and excitement. It is also a time of many changes in the life of the parents. Regardless of how many times a mother gives birth, each child brings new challenges and dynamics to the family. It is not unusual to have feelings of excitement along with confusing shifts in moods, emotions, and thoughts. All mothers are at risk of developing postpartum depression or the "baby blues." These mood changes can occur right after giving birth, or they may occur many months after giving birth. The baby blues or postpartum depression can be mild or severe. Additionally, postpartum depression can go away rather quickly, or it can be a long-term condition.  °CAUSES °Raised hormone levels and the rapid drop in those levels are thought to be a main cause of postpartum depression and the baby blues. A number of hormones change during and after pregnancy. Estrogen and progesterone usually decrease right after the delivery of your baby. The levels of thyroid hormone and various cortisol steroids also rapidly drop. Other factors that play a role in these mood changes include major life events and genetics.  °RISK FACTORS °If you have any of the following risks for the baby blues or postpartum depression, know what symptoms to watch out for during the postpartum period. Risk factors that may increase the likelihood of getting the baby blues or postpartum depression include: °· Having a personal or family history of depression.   °· Having depression while being pregnant.   °· Having premenstrual mood issues or mood issues related to oral contraceptives. °· Having a lot of life stress.   °· Having marital conflict.   °· Lacking a social support network.   °· Having a baby with special  needs.   °· Having health problems, such as diabetes.   °SIGNS AND SYMPTOMS °Symptoms of baby blues include: °· Brief changes in mood, such as going from extreme happiness to sadness. °· Decreased concentration.   °· Difficulty sleeping.   °· Crying spells, tearfulness.   °· Irritability.   °· Anxiety.   °Symptoms of postpartum depression typically begin within the first month after giving birth. These symptoms include: °· Difficulty sleeping or excessive sleepiness.   °· Marked weight loss.   °· Agitation.   °· Feelings of worthlessness.   °· Lack of interest in activity or food.   °Postpartum psychosis is a very serious condition and can be dangerous. Fortunately, it is rare. Displaying any of the following symptoms is cause for immediate medical attention. Symptoms of postpartum psychosis include:  °· Hallucinations and delusions.   °· Bizarre or disorganized behavior.   °· Confusion or disorientation.   °DIAGNOSIS  °A diagnosis is made by an evaluation of your symptoms. There are no medical or lab tests that lead to a diagnosis, but there are various questionnaires that a health care provider may use to identify those with the baby blues, postpartum depression, or psychosis. Often, a screening tool called the Edinburgh Postnatal Depression Scale is used to diagnose depression in the postpartum period.  °TREATMENT °The baby blues   goes away on its own in 1-2 weeks. Social support is often all that is needed. You will be encouraged to get adequate sleep and rest. Occasionally, you may be given medicines to help you sleep.  Postpartum depression requires treatment because it can last several months or longer if it is not treated. Treatment may include individual or group therapy, medicine, or both to address any social, physiological, and psychological factors that may play a role in the depression. Regular exercise, a healthy diet, rest, and social support may also be strongly recommended.  Postpartum  psychosis is more serious and needs treatment right away. Hospitalization is often needed. HOME CARE INSTRUCTIONS  Get as much rest as you can. Nap when the baby sleeps.   Exercise regularly. Some women find yoga and walking to be beneficial.   Eat a balanced and nourishing diet.   Do little things that you enjoy. Have a cup of tea, take a bubble bath, read your favorite magazine, or listen to your favorite music.  Avoid alcohol.   Ask for help with household chores, cooking, grocery shopping, or running errands as needed. Do not try to do everything.   Talk to people close to you about how you are feeling. Get support from your partner, family members, friends, or other new moms.  Try to stay positive in how you think. Think about the things you are grateful for.   Do not spend a lot of time alone.   Only take over-the-counter or prescription medicine as directed by your health care provider.  Keep all your postpartum appointments.   Let your health care provider know if you have any concerns.  SEEK MEDICAL CARE IF: You are having a reaction to or problems with your medicine. SEEK IMMEDIATE MEDICAL CARE IF:  You have suicidal feelings.   You think you may harm the baby or someone else. MAKE SURE YOU:  Understand these instructions.  Will watch your condition.  Will get help right away if you are not doing well or get worse.   This information is not intended to replace advice given to you by your health care provider. Make sure you discuss any questions you have with your health care provider.   Document Released: 12/20/2003 Document Revised: 03/22/2013 Document Reviewed: 12/27/2012 Elsevier Interactive Patient Education [employer]2016 Elsevier Inc.

## 2015-02-16 NOTE — Discharge Summary (Signed)
OB Discharge Summary     Patient Name: Rachel Stuart DOB: 06/02/1995 MRN: [id number]  Date of admission: 02/14/2015 Delivering MD: Cam HaiSHAW, Bobie Kistler D   Date of discharge: 02/16/2015  Admitting diagnosis: 40w active labor Intrauterine pregnancy: 3560w6d     Secondary diagnosis:  Principal Problem:   NSVD (normal spontaneous vaginal delivery) Active Problems:   Active labor at term  Additional problems: None     Discharge diagnosis: Term Pregnancy Delivered                                                                                                Post partum procedures:None  Augmentation: Pitocin  Complications: None  Hospital course:  Onset of Labor With Vaginal Delivery     19 y.o. yo [id number] at 5860w6d was admitted in Latent Laboron 02/14/2015. Patient had an uncomplicated labor course as follows:  Membrane Rupture Time/Date: 12:46 AM ,02/15/2015   Intrapartum Procedures: Episiotomy: None [1]                                         Lacerations:  None [1]  Patient had a delivery of a Viable infant. 02/15/2015  Information for the patient's newborn:  Marica [place], Girl Malika [[id number]][[id number]]  Delivery Method: Vaginal, Spontaneous Delivery (Filed from Delivery Summary)   Pateint had an uncomplicated postpartum course.  She was seen by SW given her history of depression and they had an extensive discussion regarding her history of depression. She was not in fact taking Zoloft due to vague side effects. She will be sent home without medication but follow up with baby love RN.  She is ambulating, tolerating a regular diet, passing flatus, and urinating well. Patient is discharged home in stable condition on 02/16/2015   Physical exam  Filed Vitals:   02/15/15 0700 02/15/15 0900 02/15/15 1838 02/16/15 0610  BP:  115/53 103/56 98/58  Pulse:  58 67 57  Temp: 99.7 F (37.6 C) 97.4 F (36.3 C) 98.5 F (36.9 C) 98.2 F (36.8 C)  TempSrc:   Oral Oral  Resp:  20 18 18   Height:       Weight:      SpO2:  99%     General: alert, cooperative and no distress Lochia: appropriate Uterine Fundus: firm Incision: N/A DVT Evaluation: No evidence of DVT seen on physical exam. Labs: Lab Results  Component Value Date   WBC 9.5 02/14/2015   HGB 11.7* 02/14/2015   HCT 34.1* 02/14/2015   MCV 83.8 02/14/2015   PLT 252 02/14/2015   CMP Latest Ref Rng 06/10/2014  Glucose 70 - 99 mg/dL 97  BUN 6 - 23 mg/dL 7  Creatinine 1.300.50 - 8.651.10 mg/dL 7.840.67  Sodium 696135 - 295145 mmol/L 139  Potassium 3.5 - 5.1 mmol/L 3.6  Chloride 96 - 112 mmol/L 106  CO2 19 - 32 mmol/L 24  Calcium 8.4 - 10.5 mg/dL 9.1  Total Protein 6.0 - 8.3 g/dL 6.6  Total Bilirubin 0.3 - 1.2 mg/dL 1.0  Alkaline  Phos 39 - 117 U/L 50  AST 0 - 37 U/L 24  ALT 0 - 35 U/L 18    Discharge instruction: per After Visit Summary and "Baby and Me Booklet".  After visit meds:    Medication List    TAKE these medications        CONCEPT OB 130-92.4-1 MG Caps  Take 1 tablet by mouth daily.     ibuprofen 600 MG tablet  Commonly known as:  ADVIL,MOTRIN  Take 1 tablet (600 mg total) by mouth every 6 (six) hours.        Diet: routine diet  Activity: Advance as tolerated. Pelvic rest for 6 weeks.   Outpatient follow up:6 weeks Follow up Appt: Baby [facility] referral placed for 2 week mood check  Postpartum contraception: Combination OCPs and IUD Mirena- deciding between these methods  Newborn Data: Live born female  Birth Weight: 7 lb 11.3 oz (3495 g) APGAR: 9, 9  Baby Feeding: Bottle Disposition:home with mother   02/16/2015 Federico Flake, MD

## 2015-02-16 NOTE — Clinical Social Work Maternal (Signed)
CLINICAL SOCIAL WORK MATERNAL/CHILD NOTE  Patient Details  Name: Rachel Stuart MRN: [id number] Date of Birth: 08/31/1995  Date:  02/16/2015  Clinical Social Worker Initiating Note:  Loleta BooksSarah Azavion Bouillon, MSW, LCSW Date/ Time Initiated:  02/16/15/1145     Child's Name:  Gardiner RhymeMarisol   Legal Guardian:  Rachel Stuart and Lafayette DragonFelipe   Need for Interpreter:  None   Date of Referral:  09-Jul-2014     Reason for Referral:  History of depression, Current Substance Use/Substance Use During Pregnancy    [facility]   Address:  [address] [place], [place] [address]  Phone number:  [phone]   Household Members:  Minor Children, Significant Other, Parents   Natural Supports (not living in the home):  Extended Family, Immediate Family   Professional Supports: Therapist   Employment: Homemaker   Type of Work:   N/A  Education:  Associate ProfessorHigh school graduate   Financial Resources:  Medicaid   [facility]   Cultural/Religious Considerations Which May Impact Care:  None reported  Strengths:  Ability to meet basic needs , Pediatrician chosen , Home prepared for child    Risk Factors/Current Problems:   1)Mental Health Concerns: MOB presents with history of depression and postpartum depression.  [facility]   2)Substance Use: MOB denied substance use during the pregnancy despite +UDS for marijuana in April.  MOB stated that she was "near it".  Infant's UDS is negative and MDS is pending.   Cognitive State:  Able to Concentrate , Alert , Insightful , Linear Thinking , Goal Oriented    Mood/Affect:  Happy , Comfortable , Calm    CSW Assessment:  CSW received request for consult due to history of depression and marijuana use during the pregnancy.  MOB provided consent for her grandmother to remain in the room during the assessment.  MOB presented as easily engaged and receptive to the visit. She displayed a  full range in affect, and was noted to be in a pleasant mood.  MOB's grandmother cared for the infant during the entire assessment, but she also participated in the assessment.   MOB openly discussed her thoughts and feelings secondary to her childbirth experience and her transition postpartum.  MOB stated that it was a positive childbirth experience, and shared that it was much better than the birth of her first child (which she identified as traumatic).  MOB reported that she feels excited about the birth of this infant, and is looking forward to transitioning to caring for two children.  MOB stated that she lives at home with her mother, sibling, FOB, and her 21 year old daughter, and shared that her grandmother also lives next door.  She reported that the FOB is frequently absent from the home since he is a "roofer" and recently became a Production designer, theatre/television/filmmanager.  She discussed belief that she is well supported by her family, acknowledges that she is not alone, and is aware of the importance of engaging in self-care activities as she transitions postpartum.  MOB stated that she is a stay at home and enjoys her current role at at home.  MOB reported history of depression since adolescence, and also confirmed a strong family history of depression. Per MOB, she experienced postpartum depression after her first child was born, and experienced onset of symptoms during that pregnancy. It appeared difficult for MOB to clarify exact symptoms that she experienced postpartum, but stated that she was also  young (19 years old), and felt minimally supported by her family. She denied any need for treatment at that time.  MOB denied any concerns related to her mental health during this pregnancy, but stated that she does have a therapist with [facility]. She shared that she feels that she has a strong therapeutic relationship with her therapist, and intends to continue to work her therapist postpartum.  MOB denied any current  medications, despite documentation that she is prescribed Zoloft.  MOB shared that she had been prescribed Zoloft by the health department, but felt like "I was dying" when she was on the medication and chose to discontinue it. She stated that if she notes need for medication in the future, she is receptive to medication; however, would prefer a different medication.  MOB denied need for medication at this time.  CSW provided education on perinatal mood disorders, and put forth effort to normalize common symptoms. MOB recognized and acknowledged that she presents with an increased risk for developing symptoms, and agreed to notify her therapist and/or her medical providers if she notes onset of symptoms.  CSW continued to explore potential emotional regulation skills that she can utilize if she notes onset of symptoms, and MOB stated that she enjoys spending time with her daughter. She stated that during the pregnancy when she noted lower levels of energy and felt "down", she would distract herself by playing with her daughter.  MOB shared that she maintains focused on the well being of her children, and discussed a goal of focusing on them to help her cope with and address symptoms if they arise.    MOB denied any substance use during the pregnancy. MOB acknowledged that she had a +UDS for marijuana in April, but denied any use herself. She stated that the FOB smokes marijuana and that she was near him.  MOB stated that she has never smoked marijuana, and informed the FOB that he is not allowed to use marijuana in her presence.  CSW informed MOB of hospital drug screen policy, and MOB denied questions or concerns related to the policy. She expressed confidence that the infant will have a negative drug screen since she has never directly used.  MOB aware that a referral to CPS will be made if there is a positive drug screen, and she denied questions or concerns.   MOB and her grandmother denied additional  questions, concerns, or needs at this time.  They expressed appreciation for the visit and support, and agreed to contact CSW if needs arise during the admission.   CSW Plan/Description:   1)Patient/Family Education: Perinatal mood disorders, hospital drug screen policy 2)Information/Referral to Community Resources: Feelings After Birth support group 3) CSW to monitor infant's toxicology screens, and will notify CPS if there is a positive drug screen.  4)No Further Intervention Required/No Barriers to Discharge    Eimy Plaza N, LCSW 02/16/2015, 12:22 PM  

## 2015-02-16 NOTE — Progress Notes (Signed)
Post Partum Day 1 Subjective:  Rachel Stuart is a 19 y.o. [id number] 5464w6d s/p SVD.  No acute events overnight.  Pt denies problems with ambulating, voiding or po intake.  She denies nausea or vomiting.  Pain is moderately controlled.  She has had flatus.  Lochia Minimal.  Plan for birth control is either OCP's or IUD, still deciding. Method of Feeding: bottle.   Patient reports intermittent intense cramping pain.   Objective: Blood pressure 98/58, pulse 57, temperature 98.2 F (36.8 C), temperature source Oral, resp. rate 18, height 5\' 3"  (1.6 m), weight 197 lb (89.359 kg), last menstrual period 05/05/2014, SpO2 99 %, unknown if currently breastfeeding.  Physical Exam:  General: alert, cooperative and no distress Lochia:normal flow Chest: normal WOB Heart: Regular rate Abdomen: +BS, soft, mild TTP (appropriate) Uterine Fundus: firm, tender to palpation and at the level of the umbilicus. DVT Evaluation: No evidence of DVT seen on physical exam. Extremities: trace edema   Recent Labs  02/14/15 2020  HGB 11.7*  HCT 34.1*    Assessment/Plan:  ASSESSMENT: Rachel Stuart is a 19 y.o. [id number] 864w6d s/p SVD. Doing well. Still in some pain. Uterus is tender to palpation. Otherwise stable with minimal concern for infection or decompensation.   Advised patient that she can ask for her Percocet up to every 4 hrs and ibuprofen up to every 6 hrs if she is having a lot of pain as she has not used them at this frequency. Continue to monitor for pain control.   Plan for discharge tomorrow  Hx of depression on zoloft, possible SW consult.  Continue routine PP care Breastfeeding support PRN  LOS: 2 days   JohannesL Du Pisanie 02/16/2015, 7:54 AM   I was present for the exam and agree with above. See Discharge note.   [place]ia Lloyde Ludlam, CNM 02/16/2015 9:53 AM

## 2015-10-07 ENCOUNTER — Emergency Department (HOSPITAL_COMMUNITY)
Admission: EM | Admit: 2015-10-07 | Discharge: 2015-10-08 | Disposition: A | Discharge status: Home / Self Care | Payer: Medicaid Other | Attending: Emergency Medicine | Admitting: Emergency Medicine

## 2015-10-07 ENCOUNTER — Encounter (HOSPITAL_COMMUNITY): Payer: Self-pay | Admitting: *Deleted

## 2015-10-07 DIAGNOSIS — G43009 Migraine without aura, not intractable, without status migrainosus: Secondary | ICD-10-CM

## 2015-10-07 DIAGNOSIS — R51 Headache: Secondary | ICD-10-CM | POA: Diagnosis present

## 2015-10-07 DIAGNOSIS — G43909 Migraine, unspecified, not intractable, without status migrainosus: Secondary | ICD-10-CM | POA: Diagnosis not present

## 2015-10-07 DIAGNOSIS — N390 Urinary tract infection, site not specified: Secondary | ICD-10-CM | POA: Diagnosis not present

## 2015-10-07 DIAGNOSIS — Z79899 Other long term (current) drug therapy: Secondary | ICD-10-CM | POA: Diagnosis not present

## 2015-10-07 NOTE — ED Provider Notes (Signed)
By signing my name below, I, Bethel Born, attest that this documentation has been prepared under the direction and in the presence of Kristen N Ward, DO. Electronically Signed: Bethel Born, ED Scribe. 10/08/2015. 12:25 AM.  TIME SEEN: 12:03 AM  CHIEF COMPLAINT: Headache  HPI: Rachel Stuart is a 20 y.o. female who presents to the Emergency Department complaining of a severe, frontal, headache with gradual onset approximately 9-10 hours ago between 2:33 PM. Pt states that she has never had a headache like this. 1000 mg of Tylenol and 400 mg of ibuprofen, last dose approximately 8 hours ago, provided insufficient pain relief at home.  Associated symptoms include eye pain, nausea, vomiting, photophobia, and phonophobia. Pt denies measured fever at home, diarrhea, cough, dysuria, hematuria, chest pain, SOB, neck pain, back pain, sore throat, ear pain, and abdominal pain. She has had some suprapubic pressure. LNMP was 10/01/15.  No recent head injury, tick bites, or international travel. No known sick contact. She is not on an anticoagulant. The patient's sister has history of migraines but she does not. States she has never had headache like this before. No history of chronic headaches.  ROS: See HPI Constitutional: no fever  Eyes: no drainage  ENT: no runny nose   Cardiovascular:  no chest pain  Resp: no SOB  GI: vomiting GU: no dysuria Integumentary: no rash  Allergy: no hives  Musculoskeletal: no leg swelling  Neurological: no slurred speech ROS otherwise negative  PAST MEDICAL HISTORY/PAST SURGICAL HISTORY:  Past Medical History  Diagnosis Date  . Ovarian cyst   . Obesity   . Ankle fracture   . Anxiety   . Depression     zoloft    MEDICATIONS:  Prior to Admission medications   Medication Sig Start Date End Date Taking? Authorizing Provider  ibuprofen (ADVIL,MOTRIN) 600 MG tablet Take 1 tablet (600 mg total) by mouth every 6 (six) hours. 02/16/15   Federico Flake, MD   Prenat w/o A Vit-FeFum-FePo-FA (CONCEPT OB) 130-92.4-1 MG CAPS Take 1 tablet by mouth daily. 06/08/14   Dorathy Kinsman, CNM    ALLERGIES:  Allergies  Allergen Reactions  . Amoxicillin Swelling    Lips swell    SOCIAL HISTORY:  Social History  Substance Use Topics  . Smoking status: Never Smoker   . Smokeless tobacco: Never Used  . Alcohol Use: No    FAMILY HISTORY: Family History  Problem Relation Age of Onset  . Hypertension Maternal Grandmother   . Diabetes Maternal Grandmother   . Stroke Maternal Grandmother   . Cancer Maternal Grandmother     uterine  . Depression Maternal Grandmother   . Diabetes Paternal Grandfather   . Depression Mother   . Mental illness Mother     EXAM: BP 116/66 mmHg  Pulse 88  Temp(Src) 100.6 F (38.1 C) (Oral)  Resp 19  Ht  (1.6 m)  Wt 210 lb (95.255 kg)  BMI 37.21 kg/m2  SpO2 97%  LMP 10/01/2015 CONSTITUTIONAL: Alert and oriented and responds appropriately to questions. Well-appearing; well-nourished, Nontoxic-appearing HEAD: Normocephalic EYES: Conjunctivae clear, PERRL, photophobia  ENT: normal nose; no rhinorrhea; moist mucous membranes NECK: Supple, no meningismus, no LAD, no nuchal rigidity  CARD: RRR; S1 and S2 appreciated; no murmurs, no clicks, no rubs, no gallops RESP: Normal chest excursion without splinting or tachypnea; breath sounds clear and equal bilaterally; no wheezes, no rhonchi, no rales, no hypoxia or respiratory distress, speaking full sentences ABD/GI: Normal bowel sounds; non-distended; soft, non-tender, no rebound,  no guarding, no peritoneal signs BACK:  The back appears normal and is non-tender to palpation, there is no CVA tenderness EXT: Normal ROM in all joints; non-tender to palpation; no edema; normal capillary refill; no cyanosis, no calf tenderness or swelling    SKIN: Normal color for age and race; warm; no rash NEURO: Moves all extremities equally, sensation to light touch intact diffusely,  cranial nerves II through XII intact PSYCH: The patient's mood and manner are appropriate. Grooming and personal hygiene are appropriate.  MEDICAL DECISION MAKING: Patient here with headache. Describes as throbbing, gradual onset with nausea, vomiting and photophobia. She was not aware that she had a fever. States she has a sister with migraine headaches. Discussed with patient and family that meningitis is on our differential but migraine is also on the differential.  Low-grade fever in the emergency department. Labs pending. No other infectious symptoms. Will treat with migraine cocktail and reassess. I suspicion for meningitis at this time is low at this time I do not feel she needs to be started on antibiotics immediately.  ED PROGRESS: Patient's labs are unremarkable. Urine does show a nitrite-positive UTI. Culture is pending. She is not pregnant. Her headache is completely gone after migraine cocktail. She is smiling, laughing. Head CT negative. Suspect that her fever is coming from a urinary tract infection and that her headache is likely a migraine. I do not think that she has meningitis or encephalitis. Discussed at length return precautions family. We'll discharge with prescription for Fioricet, Keflex. She states she has taken Keflex in the past without difficulty.   At this time, I do not feel there is any life-threatening condition present. I have reviewed and discussed all results (EKG, imaging, lab, urine as appropriate), exam findings with patient. I have reviewed nursing notes and appropriate previous records.  I feel the patient is safe to be discharged home without further emergent workup. Discussed usual and customary return precautions. Patient and family (if present) verbalize understanding and are comfortable with this plan.  Patient will follow-up with their primary care provider. If they do not have a primary care provider, information for follow-up has been provided to them. All  questions have been answered.    I personally performed the services described in this documentation, which was scribed in my presence. The recorded information has been reviewed and is accurate.      Layla MawKristen N Ward, DO 10/08/15 [phone]

## 2015-10-07 NOTE — ED Notes (Signed)
Patient presents stating worse headache ever - never had one like this before.  +photophobia, N/V

## 2015-10-08 ENCOUNTER — Emergency Department (HOSPITAL_COMMUNITY): Payer: Medicaid Other

## 2015-10-08 ENCOUNTER — Encounter (HOSPITAL_COMMUNITY): Payer: Self-pay | Admitting: Radiology

## 2015-10-08 LAB — CBC WITH DIFFERENTIAL/PLATELET
BASOS ABS: 0 10*3/uL (ref 0.0–0.1)
BASOS PCT: 1 %
EOS PCT: 1 %
Eosinophils Absolute: 0.1 10*3/uL (ref 0.0–0.7)
HCT: 39.5 % (ref 36.0–46.0)
Hemoglobin: 13.2 g/dL (ref 12.0–15.0)
Lymphocytes Relative: 22 %
Lymphs Abs: 1.8 10*3/uL (ref 0.7–4.0)
MCH: 27.7 pg (ref 26.0–34.0)
MCHC: 33.4 g/dL (ref 30.0–36.0)
MCV: 82.8 fL (ref 78.0–100.0)
MONO ABS: 0.4 10*3/uL (ref 0.1–1.0)
Monocytes Relative: 4 %
Neutro Abs: 6.2 10*3/uL (ref 1.7–7.7)
Neutrophils Relative %: 72 %
PLATELETS: 287 10*3/uL (ref 150–400)
RBC: 4.77 MIL/uL (ref 3.87–5.11)
RDW: 13 % (ref 11.5–15.5)
WBC: 8.5 10*3/uL (ref 4.0–10.5)

## 2015-10-08 LAB — URINE MICROSCOPIC-ADD ON

## 2015-10-08 LAB — URINALYSIS, ROUTINE W REFLEX MICROSCOPIC
BILIRUBIN URINE: NEGATIVE
GLUCOSE, UA: NEGATIVE mg/dL
HGB URINE DIPSTICK: NEGATIVE
KETONES UR: NEGATIVE mg/dL
Nitrite: POSITIVE — AB
PROTEIN: 30 mg/dL — AB
Specific Gravity, Urine: 1.022 (ref 1.005–1.030)
pH: 8.5 — ABNORMAL HIGH (ref 5.0–8.0)

## 2015-10-08 LAB — BASIC METABOLIC PANEL
ANION GAP: 6 (ref 5–15)
BUN: 7 mg/dL (ref 6–20)
CALCIUM: 9.3 mg/dL (ref 8.9–10.3)
CO2: 27 mmol/L (ref 22–32)
CREATININE: 0.94 mg/dL (ref 0.44–1.00)
Chloride: 104 mmol/L (ref 101–111)
GFR calc Af Amer: >60 mL/min (ref >60)
GLUCOSE: 101 mg/dL — AB (ref 65–99)
Potassium: 3.8 mmol/L (ref 3.5–5.1)
Sodium: 137 mmol/L (ref 135–145)

## 2015-10-08 LAB — I-STAT BETA HCG BLOOD, ED (MC, WL, AP ONLY)

## 2015-10-08 MED ORDER — CEPHALEXIN 500 MG PO CAPS: 500.0000 mg | ORAL_CAPSULE | Freq: Four times a day (QID) | ORAL | Status: DC

## 2015-10-08 MED ORDER — CEPHALEXIN 250 MG PO CAPS
500.0000 mg | ORAL_CAPSULE | Freq: Once | ORAL | Status: AC
  Administered 2015-10-08: 500 mg via ORAL
  Filled 2015-10-08: qty 2

## 2015-10-08 MED ORDER — SODIUM CHLORIDE 0.9 % IV BOLUS (SEPSIS)
1000.0000 mL | Freq: Once | INTRAVENOUS | Status: AC
Start: 2015-10-08 — End: 2015-10-08
  Administered 2015-10-08: 1000 mL via INTRAVENOUS

## 2015-10-08 MED ORDER — METOCLOPRAMIDE HCL 5 MG/ML IJ SOLN
10.0000 mg | Freq: Once | INTRAMUSCULAR | Status: AC
  Administered 2015-10-08: 10 mg via INTRAVENOUS
  Filled 2015-10-08: qty 2

## 2015-10-08 MED ORDER — DIPHENHYDRAMINE HCL 50 MG/ML IJ SOLN
25.0000 mg | Freq: Once | INTRAMUSCULAR | Status: AC
  Administered 2015-10-08: 25 mg via INTRAVENOUS
  Filled 2015-10-08: qty 1

## 2015-10-08 MED ORDER — BUTALBITAL-APAP-CAFFEINE 50-325-40 MG PO TABS: 1.0000 | ORAL_TABLET | Freq: Four times a day (QID) | ORAL | Status: DC | PRN

## 2015-10-08 MED ORDER — KETOROLAC TROMETHAMINE 30 MG/ML IJ SOLN
30.0000 mg | Freq: Once | INTRAMUSCULAR | Status: AC
  Administered 2015-10-08: 30 mg via INTRAVENOUS
  Filled 2015-10-08: qty 1

## 2015-10-08 NOTE — ED Notes (Addendum)
Pt able to ambulate independently.  Gait steady and even.  Pt up to restroom to attempt urine sample.  Pt sts "I am feeling much better"

## 2015-10-08 NOTE — Discharge Instructions (Signed)
Migraine Headache °A migraine headache is an intense, throbbing pain on one or both sides of your head. A migraine can last for 30 minutes to several hours. °CAUSES  °The exact cause of a migraine headache is not always known. However, a migraine may be caused when nerves in the brain become irritated and release chemicals that cause inflammation. This causes pain. °Certain things may also trigger migraines, such as: °· Alcohol. °· Smoking. °· Stress. °· Menstruation. °· Aged cheeses. °· Foods or drinks that contain nitrates, glutamate, aspartame, or tyramine. °· Lack of sleep. °· Chocolate. °· Caffeine. °· Hunger. °· Physical exertion. °· Fatigue. °· Medicines used to treat chest pain (nitroglycerine), birth control pills, estrogen, and some blood pressure medicines. °SIGNS AND SYMPTOMS °· Pain on one or both sides of your head. °· Pulsating or throbbing pain. °· Severe pain that prevents daily activities. °· Pain that is aggravated by any physical activity. °· Nausea, vomiting, or both. °· Dizziness. °· Pain with exposure to bright lights, loud noises, or activity. °· General sensitivity to bright lights, loud noises, or smells. °Before you get a migraine, you may get warning signs that a migraine is coming (aura). An aura may include: °· Seeing flashing lights. °· Seeing bright spots, halos, or zigzag lines. °· Having tunnel vision or blurred vision. °· Having feelings of numbness or tingling. °· Having trouble talking. °· Having muscle weakness. °DIAGNOSIS  °A migraine headache is often diagnosed based on: °· Symptoms. °· Physical exam. °· A CT scan or MRI of your head. These imaging tests cannot diagnose migraines, but they can help rule out other causes of headaches. °TREATMENT °Medicines may be given for pain and nausea. Medicines can also be given to help prevent recurrent migraines.  °HOME CARE INSTRUCTIONS °· Only take over-the-counter or prescription medicines for pain or discomfort as directed by your  health care provider. The use of long-term narcotics is not recommended. °· Lie down in a dark, quiet room when you have a migraine. °· Keep a journal to find out what may trigger your migraine headaches. For example, write down: °¨ What you eat and drink. °¨ How much sleep you get. °¨ Any change to your diet or medicines. °· Limit alcohol consumption. °· Quit smoking if you smoke. °· Get 7-9 hours of sleep, or as recommended by your health care provider. °· Limit stress. °· Keep lights dim if bright lights bother you and make your migraines worse. °SEEK IMMEDIATE MEDICAL CARE IF:  °· Your migraine becomes severe. °· You have a fever. °· You have a stiff neck. °· You have vision loss. °· You have muscular weakness or loss of muscle control. °· You start losing your balance or have trouble walking. °· You feel faint or pass out. °· You have severe symptoms that are different from your first symptoms. °MAKE SURE YOU:  °· Understand these instructions. °· Will watch your condition. °· Will get help right away if you are not doing well or get worse. °  °This information is not intended to replace advice given to you by your health care provider. Make sure you discuss any questions you have with your health care provider. °  °Document Released: 03/17/2005 Document Revised: 04/07/2014 Document Reviewed: 11/22/2012 °Elsevier Interactive Patient Education ©2016 Elsevier Inc. ° °Urinary Tract Infection °Urinary tract infections (UTIs) can develop anywhere along your urinary tract. Your urinary tract is your body's drainage system for removing wastes and extra water. Your urinary tract includes two kidneys,   two ureters, a bladder, and a urethra. Your kidneys are a pair of bean-shaped organs. Each kidney is about the size of your fist. They are located below your ribs, one on each side of your spine. °CAUSES °Infections are caused by microbes, which are microscopic organisms, including fungi, viruses, and bacteria. These  organisms are so small that they can only be seen through a microscope. Bacteria are the microbes that most commonly cause UTIs. °SYMPTOMS  °Symptoms of UTIs may vary by age and gender of the patient and by the location of the infection. Symptoms in young women typically include a frequent and intense urge to urinate and a painful, burning feeling in the bladder or urethra during urination. Older women and men are more likely to be tired, shaky, and weak and have muscle aches and abdominal pain. A fever may mean the infection is in your kidneys. Other symptoms of a kidney infection include pain in your back or sides below the ribs, nausea, and vomiting. °DIAGNOSIS °To diagnose a UTI, your caregiver will ask you about your symptoms. Your caregiver will also ask you to provide a urine sample. The urine sample will be tested for bacteria and white blood cells. White blood cells are made by your body to help fight infection. °TREATMENT  °Typically, UTIs can be treated with medication. Because most UTIs are caused by a bacterial infection, they usually can be treated with the use of antibiotics. The choice of antibiotic and length of treatment depend on your symptoms and the type of bacteria causing your infection. °HOME CARE INSTRUCTIONS °· If you were prescribed antibiotics, take them exactly as your caregiver instructs you. Finish the medication even if you feel better after you have only taken some of the medication. °· Drink enough water and fluids to keep your urine clear or pale yellow. °· Avoid caffeine, tea, and carbonated beverages. They tend to irritate your bladder. °· Empty your bladder often. Avoid holding urine for long periods of time. °· Empty your bladder before and after sexual intercourse. °· After a bowel movement, women should cleanse from front to back. Use each tissue only once. °SEEK MEDICAL CARE IF:  °· You have back pain. °· You develop a fever. °· Your symptoms do not begin to resolve within 3  days. °SEEK IMMEDIATE MEDICAL CARE IF:  °· You have severe back pain or lower abdominal pain. °· You develop chills. °· You have nausea or vomiting. °· You have continued burning or discomfort with urination. °MAKE SURE YOU:  °· Understand these instructions. °· Will watch your condition. °· Will get help right away if you are not doing well or get worse. °  °This information is not intended to replace advice given to you by your health care provider. Make sure you discuss any questions you have with your health care provider. °  °Document Released: 12/25/2004 Document Revised: 12/06/2014 Document Reviewed: 04/25/2011 °Elsevier Interactive Patient Education ©2016 Elsevier Inc. ° °

## 2015-10-10 LAB — URINE CULTURE

## 2015-10-11 ENCOUNTER — Telehealth (HOSPITAL_COMMUNITY): Payer: Self-pay

## 2015-10-11 NOTE — Telephone Encounter (Signed)
Post ED Visit - Positive Culture Follow-up  Culture report reviewed by antimicrobial stewardship pharmacist:  []  Enzo BiNathan Batchelder, Pharm.D. []  Celedonio MiyamotoJeremy Frens, Pharm.D., BCPS []  Garvin FilaMike Maccia, Pharm.D. [x]  Georgina PillionElizabeth Martin, Pharm.D., BCPS []  Rincon ValleyMinh Pham, [address]D., BCPS, AAHIVP []  Estella HuskMichelle Turner, Pharm.D., BCPS, AAHIVP []  Tennis Mustassie Stewart, Pharm.D. []  Sherle Poeob Vincent, [address]  Positive urine culture Treated with cephalexin, organism sensitive to the same and no further patient follow-up is required at this time.  Ashley JacobsFesterman, Rachel Stuart 10/11/2015, 11:21 AM

## 2016-04-11 ENCOUNTER — Emergency Department (HOSPITAL_COMMUNITY)
Admission: EM | Admit: 2016-04-11 | Discharge: 2016-04-11 | Disposition: A | Discharge status: Home / Self Care | Payer: Medicaid Other | Attending: Emergency Medicine | Admitting: Emergency Medicine

## 2016-04-11 ENCOUNTER — Encounter (HOSPITAL_COMMUNITY): Payer: Self-pay | Admitting: Emergency Medicine

## 2016-04-11 DIAGNOSIS — R112 Nausea with vomiting, unspecified: Secondary | ICD-10-CM | POA: Diagnosis present

## 2016-04-11 DIAGNOSIS — R1084 Generalized abdominal pain: Secondary | ICD-10-CM | POA: Diagnosis not present

## 2016-04-11 DIAGNOSIS — Z79899 Other long term (current) drug therapy: Secondary | ICD-10-CM | POA: Diagnosis not present

## 2016-04-11 LAB — COMPREHENSIVE METABOLIC PANEL
ALT: 21 U/L (ref 14–54)
AST: 19 U/L (ref 15–41)
Albumin: 4.6 g/dL (ref 3.5–5.0)
Alkaline Phosphatase: 57 U/L (ref 38–126)
Anion gap: 8 (ref 5–15)
BUN: 15 mg/dL (ref 6–20)
CO2: 26 mmol/L (ref 22–32)
Calcium: 9.5 mg/dL (ref 8.9–10.3)
Chloride: 105 mmol/L (ref 101–111)
Creatinine, Ser: 0.54 mg/dL (ref 0.44–1.00)
GFR calc Af Amer: >60 mL/min (ref >60)
GFR calc non Af Amer: >60 mL/min (ref >60)
Glucose, Bld: 123 mg/dL — ABNORMAL HIGH (ref 65–99)
Potassium: 3.5 mmol/L (ref 3.5–5.1)
Sodium: 139 mmol/L (ref 135–145)
Total Bilirubin: 0.6 mg/dL (ref 0.3–1.2)
Total Protein: 7.9 g/dL (ref 6.5–8.1)

## 2016-04-11 LAB — CBC
HCT: 40 % (ref 36.0–46.0)
Hemoglobin: 13.9 g/dL (ref 12.0–15.0)
MCH: 27.9 pg (ref 26.0–34.0)
MCHC: 34.8 g/dL (ref 30.0–36.0)
MCV: 80.2 fL (ref 78.0–100.0)
Platelets: 296 10*3/uL (ref 150–400)
RBC: 4.99 MIL/uL (ref 3.87–5.11)
RDW: 13 % (ref 11.5–15.5)
WBC: 6.6 10*3/uL (ref 4.0–10.5)

## 2016-04-11 LAB — URINALYSIS, ROUTINE W REFLEX MICROSCOPIC
Bilirubin Urine: NEGATIVE
GLUCOSE, UA: NEGATIVE mg/dL
KETONES UR: 5 mg/dL — AB
Nitrite: NEGATIVE
PH: 8 (ref 5.0–8.0)
PROTEIN: NEGATIVE mg/dL
Specific Gravity, Urine: 1.02 (ref 1.005–1.030)

## 2016-04-11 LAB — I-STAT BETA HCG BLOOD, ED (MC, WL, AP ONLY)

## 2016-04-11 LAB — HCG, QUANTITATIVE, PREGNANCY: hCG, Beta Chain, Quant, S: <1 m[IU]/mL (ref <5)

## 2016-04-11 LAB — LIPASE, BLOOD: Lipase: 22 U/L (ref 11–51)

## 2016-04-11 MED ORDER — HALOPERIDOL LACTATE 5 MG/ML IJ SOLN
2.0000 mg | Freq: Once | INTRAMUSCULAR | Status: AC
  Administered 2016-04-11: 2 mg via INTRAVENOUS
  Filled 2016-04-11: qty 1

## 2016-04-11 MED ORDER — ONDANSETRON HCL 4 MG/2ML IJ SOLN
4.0000 mg | Freq: Once | INTRAMUSCULAR | Status: AC
  Administered 2016-04-11: 4 mg via INTRAVENOUS
  Filled 2016-04-11: qty 2

## 2016-04-11 MED ORDER — ONDANSETRON 4 MG PO TBDP: 4.0000 mg | ORAL_TABLET | Freq: Three times a day (TID) | ORAL | 0 refills | Status: DC | PRN

## 2016-04-11 MED ORDER — KETOROLAC TROMETHAMINE 30 MG/ML IJ SOLN
15.0000 mg | Freq: Once | INTRAMUSCULAR | Status: AC
  Administered 2016-04-11: 15 mg via INTRAVENOUS
  Filled 2016-04-11: qty 1

## 2016-04-11 MED ORDER — SODIUM CHLORIDE 0.9 % IV BOLUS (SEPSIS)
1000.0000 mL | Freq: Once | INTRAVENOUS | Status: AC
  Administered 2016-04-11: 1000 mL via INTRAVENOUS

## 2016-04-11 MED ORDER — GI COCKTAIL ~~LOC~~
30.0000 mL | Freq: Once | ORAL | Status: AC
  Administered 2016-04-11: 30 mL via ORAL
  Filled 2016-04-11: qty 30

## 2016-04-11 NOTE — ED Provider Notes (Signed)
[facility]-EMERGENCY DEPT Provider Note   CSN: [id number] Arrival date & time: 04/11/16  0431     History   Chief Complaint Chief Complaint  Patient presents with  . Emesis    HPI Rachel Stuart is a 21 y.o. female.  HPI Patient presents with concern of nausea and vomiting. Patient notes over the past week, during her menstrual cycle she has had ongoing nausea, intermittent vomiting. However, the patient awoke about 4 hours ago, with persistent vomiting. Since that time she has had innumerable episodes of vomiting. There is diffuse anterior mid abdominal discomfort, sore, nonradiating. No lower or upper abdominal pain, no diarrhea, no bowel movement changes. Patient states that she is generally well, was well prior to the onset of symptoms about one week ago. No abdominal surgery. No ability to take medication for symptom relief this morning.  Past Medical History:  Diagnosis Date  . Ankle fracture   . Anxiety   . Depression    zoloft  . Obesity   . Ovarian cyst     Patient Active Problem List   Diagnosis Date Noted  . NSVD (normal spontaneous vaginal delivery) 02/16/2015  . History of postpartum depression, currently pregnant 02/16/2015  . Active labor at term 02/14/2015  . Obesity during pregnancy in second trimester     Past Surgical History:  Procedure Laterality Date  . NO PAST SURGERIES      OB History    Gravida Para Term Preterm AB Living   3 2 2   1 2    SAB TAB Ectopic Multiple Live Births   1     0 2      Obstetric Comments   Pt stated she lost consciousness during labor due to intense pain. After receiving epidural birth went well.        Home Medications    Prior to Admission medications   Medication Sig Start Date End Date Taking? Authorizing Provider  butalbital-acetaminophen-caffeine (FIORICET) 50-325-40 MG tablet Take 1-2 tablets by mouth every 6 (six) hours as needed for headache. 10/08/15 10/07/16  Kristen N Ward, DO  cephALEXin (KEFLEX)  500 MG capsule Take 1 capsule (500 mg total) by mouth 4 (four) times daily. 10/08/15   Layla Maw Ward, DO    Family History Family History  Problem Relation Age of Onset  . Hypertension Maternal Grandmother   . Diabetes Maternal Grandmother   . Stroke Maternal Grandmother   . Cancer Maternal Grandmother     uterine  . Depression Maternal Grandmother   . Diabetes Paternal Grandfather   . Depression Mother   . Mental illness Mother   . Hypertension Mother   . Cancer Mother     Social History Social History  Substance Use Topics  . Smoking status: Never Smoker  . Smokeless tobacco: Never Used  . Alcohol use No     Allergies   Amoxicillin   Review of Systems Review of Systems  Constitutional:       Per HPI, otherwise negative  HENT:       Per HPI, otherwise negative  Respiratory:       Per HPI, otherwise negative  Cardiovascular:       Per HPI, otherwise negative  Gastrointestinal: Positive for abdominal pain, nausea and vomiting.  Endocrine:       Negative aside from HPI  Genitourinary:       Neg aside from HPI   Musculoskeletal:       Per HPI, otherwise negative  Skin: Negative.  Neurological: Negative for syncope.     Physical Exam Updated Vital Signs BP 117/68   Pulse 66   Temp 98.1 F (36.7 C) (Oral)   Resp 18   Ht 5\' 3"  (1.6 m)   Wt 200 lb (90.7 kg)   LMP 04/06/2016 (Exact Date)   SpO2 99%   BMI 35.43 kg/m   Physical Exam  Constitutional: She is oriented to person, place, and time. She appears well-developed and well-nourished.  Uncomfortable appearing obese young female presenting with her mother  HENT:  Head: Normocephalic and atraumatic.  Eyes: Conjunctivae and EOM are normal.  Cardiovascular: Normal rate and regular rhythm.   Pulmonary/Chest: Effort normal and breath sounds normal. No stridor. No respiratory distress.  Abdominal: She exhibits no distension.  Minimal abdominal discomfort anywhere, slight guarding about the mid upper  abdomen  Musculoskeletal: She exhibits no edema.  Neurological: She is alert and oriented to person, place, and time. No cranial nerve deficit.  Skin: Skin is warm and dry.  Psychiatric: She has a normal mood and affect.  Nursing note and vitals reviewed.    ED Treatments / Results  Labs (all labs ordered are listed, but only abnormal results are displayed) Labs Reviewed  COMPREHENSIVE METABOLIC PANEL - Abnormal; Notable for the following:       Result Value   Glucose, Bld 123 (*)    All other components within normal limits  LIPASE, BLOOD  CBC  HCG, QUANTITATIVE, PREGNANCY  URINALYSIS, ROUTINE W REFLEX MICROSCOPIC  I-STAT BETA HCG BLOOD, ED ([facility], [facility], [facility] ONLY)    EKG  EKG Interpretation  Date/Time:  Friday April 11 2016 07:31:33 EST Ventricular Rate:  66 PR Interval:    QRS Duration: 86 QT Interval:  408 QTC Calculation: 428 R Axis:   93 Text Interpretation:  Sinus rhythm Borderline right axis deviation No significant change since last tracing Borderline ECG Confirmed by Gerhard MunchLOCKWOOD, [place] Mignano  MD [phone]([id number]) on 04/11/2016 7:57:05 AM       Procedures Procedures (including critical care time)  Medications Ordered in ED Medications  sodium chloride 0.9 % bolus 1,000 mL (1,000 mLs Intravenous New Bag/Given 04/11/16 0726)  ondansetron (ZOFRAN) injection 4 mg (4 mg Intravenous Given 04/11/16 0727)     Initial Impression / Assessment and Plan / ED Course  I have reviewed the triage vital signs and the nursing notes.  Pertinent labs & imaging results that were available during my care of the patient were reviewed by me and considered in my medical decision making (see chart for details).  Clinical Course     After the initial evaluation patient received IV fluids, Zofran.  10:15 AM Now, following fluids, Zofran, Haldol patient has no complaints, no ongoing nausea, no vomiting, no abdominal pain. Discussed all findings with her and her mother at length. We discussed the  patient's slight hematuria, and she denies any urinary complaint, states that she just completed her menstrual cycle. With no ongoing complaints, and no evidence for obstruction, infection, bacteremia, sepsis, peritonitis, the patient was discharged to follow-up with primary care. Final Clinical Impressions(s) / ED Diagnoses  Abdominal pain Nausea, vomiting   Gerhard Munchobert Dennys Guin, MD 04/11/16 1016

## 2016-04-11 NOTE — ED Notes (Addendum)
Patient reports 20 episodes of vomiting since 0300 this morning.  States she has been vomiting x 1 week.

## 2016-04-11 NOTE — ED Notes (Signed)
ED Provider at bedside. 

## 2016-04-11 NOTE — Discharge Instructions (Signed)
As discussed, your evaluation today has been largely reassuring.  But, it is important that you monitor your condition carefully, and do not hesitate to return to the ED if you develop new, or concerning changes in your condition. ? ?Otherwise, please follow-up with your physician for appropriate ongoing care. ? ?

## 2016-04-11 NOTE — ED Triage Notes (Signed)
Pt states she has had nausea and vomiting x 1 week  Pt states it is worse tonight  Pt states she has vomited approximately 10-15 times in the past 24 hrs and is having abd pain and chest soreness from all the vomiting  Pt states she is feeling very weak and when she vomits she gets very sweaty   Denies diarrhea

## 2018-07-29 ENCOUNTER — Encounter (HOSPITAL_COMMUNITY): Payer: Self-pay | Admitting: *Deleted

## 2018-07-29 ENCOUNTER — Emergency Department (HOSPITAL_COMMUNITY): Payer: Self-pay

## 2018-07-29 ENCOUNTER — Emergency Department (HOSPITAL_COMMUNITY)
Admission: EM | Admit: 2018-07-29 | Discharge: 2018-07-29 | Disposition: A | Discharge status: Home / Self Care | Payer: Self-pay | Attending: Emergency Medicine | Admitting: Emergency Medicine

## 2018-07-29 ENCOUNTER — Other Ambulatory Visit: Payer: Self-pay

## 2018-07-29 DIAGNOSIS — R0789 Other chest pain: Secondary | ICD-10-CM

## 2018-07-29 DIAGNOSIS — F419 Anxiety disorder, unspecified: Secondary | ICD-10-CM | POA: Insufficient documentation

## 2018-07-29 DIAGNOSIS — R1010 Upper abdominal pain, unspecified: Secondary | ICD-10-CM

## 2018-07-29 DIAGNOSIS — N200 Calculus of kidney: Secondary | ICD-10-CM | POA: Insufficient documentation

## 2018-07-29 DIAGNOSIS — F329 Major depressive disorder, single episode, unspecified: Secondary | ICD-10-CM | POA: Insufficient documentation

## 2018-07-29 DIAGNOSIS — R079 Chest pain, unspecified: Secondary | ICD-10-CM | POA: Insufficient documentation

## 2018-07-29 DIAGNOSIS — N12 Tubulo-interstitial nephritis, not specified as acute or chronic: Secondary | ICD-10-CM

## 2018-07-29 LAB — COMPREHENSIVE METABOLIC PANEL
ALT: 12 U/L (ref 0–44)
AST: 14 U/L — ABNORMAL LOW (ref 15–41)
Albumin: 3.8 g/dL (ref 3.5–5.0)
Alkaline Phosphatase: 52 U/L (ref 38–126)
Anion gap: 9 (ref 5–15)
BUN: 16 mg/dL (ref 6–20)
CO2: 21 mmol/L — ABNORMAL LOW (ref 22–32)
Calcium: 8.9 mg/dL (ref 8.9–10.3)
Chloride: 107 mmol/L (ref 98–111)
Creatinine, Ser: 0.72 mg/dL (ref 0.44–1.00)
GFR calc Af Amer: >60 mL/min (ref >60)
GFR calc non Af Amer: >60 mL/min (ref >60)
Glucose, Bld: 127 mg/dL — ABNORMAL HIGH (ref 70–99)
Potassium: 3.9 mmol/L (ref 3.5–5.1)
Sodium: 137 mmol/L (ref 135–145)
Total Bilirubin: 0.3 mg/dL (ref 0.3–1.2)
Total Protein: 6.7 g/dL (ref 6.5–8.1)

## 2018-07-29 LAB — CBC WITH DIFFERENTIAL/PLATELET
Abs Immature Granulocytes: 0.04 10*3/uL (ref 0.00–0.07)
Basophils Absolute: 0.1 10*3/uL (ref 0.0–0.1)
Basophils Relative: 1 %
Eosinophils Absolute: 0.1 10*3/uL (ref 0.0–0.5)
Eosinophils Relative: 1 %
HCT: 41.5 % (ref 36.0–46.0)
Hemoglobin: 13.9 g/dL (ref 12.0–15.0)
Immature Granulocytes: 1 %
Lymphocytes Relative: 26 %
Lymphs Abs: 1.8 10*3/uL (ref 0.7–4.0)
MCH: 29.5 pg (ref 26.0–34.0)
MCHC: 33.5 g/dL (ref 30.0–36.0)
MCV: 88.1 fL (ref 80.0–100.0)
Monocytes Absolute: 0.3 10*3/uL (ref 0.1–1.0)
Monocytes Relative: 5 %
Neutro Abs: 4.7 10*3/uL (ref 1.7–7.7)
Neutrophils Relative %: 66 %
Platelets: 246 10*3/uL (ref 150–400)
RBC: 4.71 MIL/uL (ref 3.87–5.11)
RDW: 12 % (ref 11.5–15.5)
WBC: 7.1 10*3/uL (ref 4.0–10.5)
nRBC: 0 % (ref 0.0–0.2)

## 2018-07-29 LAB — URINALYSIS, ROUTINE W REFLEX MICROSCOPIC
Bilirubin Urine: NEGATIVE
Glucose, UA: NEGATIVE mg/dL
Ketones, ur: NEGATIVE mg/dL
Nitrite: NEGATIVE
Protein, ur: NEGATIVE mg/dL
Specific Gravity, Urine: 1.011 (ref 1.005–1.030)
pH: 6 (ref 5.0–8.0)

## 2018-07-29 LAB — LIPASE, BLOOD: Lipase: 29 U/L (ref 11–51)

## 2018-07-29 LAB — PREGNANCY, URINE: Preg Test, Ur: NEGATIVE

## 2018-07-29 LAB — TROPONIN I: Troponin I: <0.03 ng/mL (ref <0.03)

## 2018-07-29 MED ORDER — IOPAMIDOL (ISOVUE-300) INJECTION 61%
100.0000 mL | Freq: Once | INTRAVENOUS | Status: AC | PRN
  Administered 2018-07-29: 09:00:00 100 mL via INTRAVENOUS

## 2018-07-29 MED ORDER — MORPHINE SULFATE (PF) 4 MG/ML IV SOLN
6.0000 mg | Freq: Once | INTRAVENOUS | Status: AC
  Administered 2018-07-29: 6 mg via INTRAVENOUS
  Filled 2018-07-29: qty 2

## 2018-07-29 MED ORDER — SODIUM CHLORIDE 0.9 % IV BOLUS
1000.0000 mL | Freq: Once | INTRAVENOUS | Status: AC
  Administered 2018-07-29: 1000 mL via INTRAVENOUS

## 2018-07-29 MED ORDER — SODIUM CHLORIDE 0.9 % IV SOLN
1.0000 g | Freq: Once | INTRAVENOUS | Status: AC
  Administered 2018-07-29: 1 g via INTRAVENOUS
  Filled 2018-07-29: qty 10

## 2018-07-29 MED ORDER — SODIUM CHLORIDE 0.9 % IV SOLN
Freq: Once | INTRAVENOUS | Status: AC
  Administered 2018-07-29: 09:00:00 via INTRAVENOUS

## 2018-07-29 MED ORDER — CEPHALEXIN 500 MG PO CAPS: 500.0000 mg | ORAL_CAPSULE | Freq: Two times a day (BID) | ORAL | 0 refills | Status: DC

## 2018-07-29 MED ORDER — FENTANYL CITRATE (PF) 100 MCG/2ML IJ SOLN
50.0000 ug | INTRAMUSCULAR | Status: DC | PRN
Start: 2018-07-29 — End: 2018-07-29
  Administered 2018-07-29: 50 ug via INTRAVENOUS
  Filled 2018-07-29: qty 2

## 2018-07-29 NOTE — ED Notes (Signed)
Pt transported to xray 

## 2018-07-29 NOTE — Discharge Instructions (Signed)
Follow up your urine culture, if negative you can stop keflex antibiotics.  Return for fevers, persistent vomiting or worsening or new symptoms.

## 2018-07-29 NOTE — ED Notes (Signed)
Patient transported to X-ray 

## 2018-07-29 NOTE — ED Provider Notes (Signed)
[facility] EMERGENCY DEPARTMENT Provider Note   CSN: [id number] Arrival date & time: 07/29/18  2993    History   Chief Complaint Chief Complaint  Patient presents with  . Abdominal Pain  . Back Pain  . Chest Pain    HPI Rachel Stuart is a 23 y.o. female.  Patient presents with central and upper abdominal discomfort, sharp and pressure sensation that awoke her from sleep at 2:00 this morning.  No history of similar.  Patient is finishing her menstrual cycle yesterday.  Patient denies any significant medical or surgical history.  Patient denies blood clot risk factors.  Patient has no cardiac risk factors except for obesity.  Patient drinks one alcohol beverage per month.  Patient denies exertional symptoms.  Initially was abdominal discomfort and it moved upwards toward her central chest more pressure-like.  No recent exertional symptoms or diaphoresis.  Patient did eat fried food yesterday.  Patient had one episode of vomiting no diarrhea.  No fevers chills or shortness of breath.  No recent travel.  Patient denies history of gallbladder disease or symptoms after fatty foods.  Family history of gallbladder disease.   HPI  Past Medical History:  Diagnosis Date  . Ankle fracture   . Anxiety   . Depression    zoloft  . Obesity   . Ovarian cyst     Patient Active Problem List   Diagnosis Date Noted  . NSVD (normal spontaneous vaginal delivery) 02/16/2015  . History of postpartum depression, currently pregnant 02/16/2015  . Active labor at term 02/14/2015  . Obesity during pregnancy in second trimester     Past Surgical History:  Procedure Laterality Date  . NO PAST SURGERIES       OB History    Gravida  3   Para  2   Term  2   Preterm      AB  1   Living  2     SAB  1   TAB      Ectopic      Multiple  0   Live Births  2        Obstetric Comments  Pt stated she lost consciousness during labor due to intense pain. After receiving  epidural birth went well.          Home Medications    Prior to Admission medications   Medication Sig Start Date End Date Taking? Authorizing Provider  acetaminophen (TYLENOL) 500 MG tablet Take 500 mg by mouth every 6 (six) hours as needed for mild pain or headache.    Yes [provider]  ibuprofen (ADVIL) 200 MG tablet Take 400 mg by mouth every 6 (six) hours as needed for moderate pain.   Yes [provider]  cephALEXin (KEFLEX) 500 MG capsule Take 1 capsule (500 mg total) by mouth 2 (two) times daily. 07/30/18   Blane Ohara, MD  ondansetron (ZOFRAN ODT) 4 MG disintegrating tablet Take 1 tablet (4 mg total) by mouth every 8 (eight) hours as needed for nausea or vomiting. Patient not taking: Reported on 07/29/2018 04/11/16   Gerhard Munch, MD    Family History Family History  Problem Relation Age of Onset  . Hypertension Maternal Grandmother   . Diabetes Maternal Grandmother   . Stroke Maternal Grandmother   . Cancer Maternal Grandmother        uterine  . Depression Maternal Grandmother   . Diabetes Paternal Grandfather   . Depression Mother   .  Mental illness Mother   . Hypertension Mother   . Cancer Mother     Social History Social History   Tobacco Use  . Smoking status: Never Smoker  . Smokeless tobacco: Never Used  Substance Use Topics  . Alcohol use: Yes  . Drug use: No     Allergies   Amoxicillin   Review of Systems Review of Systems  Constitutional: Negative for chills and fever.  HENT: Negative for congestion.   Eyes: Negative for visual disturbance.  Respiratory: Negative for cough and shortness of breath.   Cardiovascular: Positive for chest pain.  Gastrointestinal: Positive for abdominal pain and vomiting.  Genitourinary: Negative for dysuria, flank pain, vaginal bleeding and vaginal discharge.  Musculoskeletal: Negative for back pain, neck pain and neck stiffness.  Skin: Negative for rash.  Neurological: Negative for  light-headedness and headaches.     Physical Exam Updated Vital Signs BP 128/77   Pulse 88   Temp 98.4 F (36.9 C) (Oral)   Resp 18   Ht 5\' 2"  (1.575 m)   Wt 79.8 kg   LMP 07/25/2018 (Exact Date)   SpO2 98%   BMI 32.19 kg/m   Physical Exam Vitals signs and nursing note reviewed.  Constitutional:      Appearance: She is well-developed.  HENT:     Head: Normocephalic and atraumatic.  Eyes:     General:        Right eye: No discharge.        Left eye: No discharge.     Conjunctiva/sclera: Conjunctivae normal.  Neck:     Musculoskeletal: Normal range of motion and neck supple.     Trachea: No tracheal deviation.  Cardiovascular:     Rate and Rhythm: Normal rate and regular rhythm.  Pulmonary:     Effort: Pulmonary effort is normal.     Breath sounds: Normal breath sounds.  Abdominal:     General: There is no distension.     Palpations: Abdomen is soft.     Tenderness: There is abdominal tenderness in the epigastric area. There is no guarding.  Skin:    General: Skin is warm.     Findings: No rash.  Neurological:     Mental Status: She is alert and oriented to person, place, and time.      ED Treatments / Results  Labs (all labs ordered are listed, but only abnormal results are displayed) Labs Reviewed  COMPREHENSIVE METABOLIC PANEL - Abnormal; Notable for the following components:      Result Value   CO2 21 (*)    Glucose, Bld 127 (*)    AST 14 (*)    All other components within normal limits  URINALYSIS, ROUTINE W REFLEX MICROSCOPIC - Abnormal; Notable for the following components:   Color, Urine STRAW (*)    Hgb urine dipstick LARGE (*)    Leukocytes,Ua LARGE (*)    Bacteria, UA RARE (*)    All other components within normal limits  URINE CULTURE  LIPASE, BLOOD  TROPONIN I  CBC WITH DIFFERENTIAL/PLATELET  PREGNANCY, URINE    EKG EKG Interpretation  Date/Time:  Thursday July 29 2018 06:48:37 EDT Ventricular Rate:  88 PR Interval:    QRS  Duration: 88 QT Interval:  352 QTC Calculation: 426 R Axis:   97 Text Interpretation:  Sinus rhythm Borderline right axis deviation No significant change since last tracing Confirmed by Rochele RaringWard, Kristen [phone]([id number]) on 07/29/2018 6:52:24 AM   Radiology Dg Chest 2 View  Result  Date: 07/29/2018 CLINICAL DATA:  Chest pain EXAM: CHEST - 2 VIEW COMPARISON:  01/30/2013 FINDINGS: The heart size and mediastinal contours are within normal limits. Both lungs are clear. The visualized skeletal structures are unremarkable. IMPRESSION: No active cardiopulmonary disease. Electronically Signed   By: Elige Ko   On: 07/29/2018 08:01   Ct Abdomen Pelvis W Contrast  Result Date: 07/29/2018 CLINICAL DATA:  Sharp pain in the right lower quadrant. EXAM: CT ABDOMEN AND PELVIS WITH CONTRAST TECHNIQUE: Multidetector CT imaging of the abdomen and pelvis was performed using the standard protocol following bolus administration of intravenous contrast. CONTRAST:  ISOVUE-300 IOPAMIDOL (ISOVUE-300) INJECTION 61% COMPARISON:  None. FINDINGS: Lower chest: No acute abnormality. Hepatobiliary: No focal liver abnormality is seen. No gallstones, gallbladder wall thickening, or biliary dilatation. Pancreas: Unremarkable. No pancreatic ductal dilatation or surrounding inflammatory changes. Spleen: Normal in size without focal abnormality. Adrenals/Urinary Tract: Adrenal glands are unremarkable. Kidneys are normal, without renal calculi, focal lesion, or hydronephrosis. Bladder is unremarkable. Stomach/Bowel: Bowel shows no evidence of obstruction, ileus, inflammation or lesion. The appendix is normal. No free air identified. Vascular/Lymphatic: No significant vascular findings are present. No enlarged abdominal or pelvic lymph nodes. Reproductive: Uterus and bilateral adnexa are unremarkable. Other: Umbilical hernia contains fat.  No focal abscess or ascites. Musculoskeletal: There are bilateral pars defects at L5 with associated 8 mm  anterolisthesis of L5 on S1 and disc space narrowing at the L5-S1 disc space level. IMPRESSION: 1. No acute findings in the abdomen or pelvis. No evidence of appendicitis. No urinary tract calculi or obstruction. 2. 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 Electronically Signed   By: Irish Lack M.D.   On: 07/29/2018 09:23    Procedures Ultrasound ED Abd Date/Time: 07/29/2018 7:41 AM Performed by: Blane Ohara, MD Authorized by: Blane Ohara, MD   Procedure details:    Indications: abdominal pain and back pain     Assessment for:  Gallstones   Hepatobiliary:  Visualized       Hepatobiliary findings:    Common bile duct:  Normal   Gallbladder wall:  Normal   Gallbladder stones: not identified     Sonographic Murphy's sign: negative     (including critical care time)  Medications Ordered in ED Medications  fentaNYL (SUBLIMAZE) injection 50 mcg (50 mcg Intravenous Given 07/29/18 0735)  sodium chloride 0.9 % bolus 1,000 mL (1,000 mLs Intravenous New Bag/Given 07/29/18 0735)  cefTRIAXone (ROCEPHIN) 1 g in sodium chloride 0.9 % 100 mL IVPB (1 g Intravenous New Bag/Given 07/29/18 0851)  0.9 %  sodium chloride infusion ( Intravenous New Bag/Given 07/29/18 0851)  morphine 4 MG/ML injection 6 mg (6 mg Intravenous Given 07/29/18 0855)  iopamidol (ISOVUE-300) 61 % injection 100 mL (100 mLs Intravenous Contrast Given 07/29/18 0915)     Initial Impression / Assessment and Plan / ED Course  I have reviewed the triage vital signs and the nursing notes.  Pertinent labs & imaging results that were available during my care of the patient were reviewed by me and considered in my medical decision making (see chart for details).     HEAR Score: 1  Patient with no significant medical history presents with primarily upper abdominal discomfort.  Discussed concern for possible gallbladder since she ate fried food  recently and with her obesity, also discussed possibility of gastritis/ulcer/viral related or pyelo with back pain and vomiting/ abd pain.  Patient did develop chest pressure since 230 this morning.  Patient is very low risk cardiac, very low risk pulmonary embolism.  Patient is PE RC negative.  Plan for single troponin since patient's had constant chest pressure since 2:00 this morning.  Bedside ultrasound revealed no gallstones no sign of cholecystitis.  Discussed she may still have biliary issues that would need a separate study outpatient.  Pain meds and IV fluids ordered.  Blood work pending  Patient improved on rechecks.  Blood work reviewed reassuring normal hemoglobin, normal white blood cell count, normal LFTs.  Urinalysis had leukocytes and possible signs of infection.  With flank pain Rocephin ordered for concern for early pyelonephritis.  Patient did have worsening right-sided abdominal discomfort, CT scan ordered and reviewed no signs of appendicitis or other acute pathology.  Discussed follow-up urine culture to see if antibiotics need to be continued and reasons to return.  Patient comfortable this plan.  Results and differential diagnosis were discussed with the patient/parent/guardian. Xrays were independently reviewed by myself.  Close follow up outpatient was discussed, comfortable with the plan.   Medications  fentaNYL (SUBLIMAZE) injection 50 mcg (50 mcg Intravenous Given 07/29/18 0735)  sodium chloride 0.9 % bolus 1,000 mL (1,000 mLs Intravenous New Bag/Given 07/29/18 0735)  cefTRIAXone (ROCEPHIN) 1 g in sodium chloride 0.9 % 100 mL IVPB (1 g Intravenous New Bag/Given 07/29/18 0851)  0.9 %  sodium chloride infusion ( Intravenous New Bag/Given 07/29/18 0851)  morphine 4 MG/ML injection 6 mg (6 mg Intravenous Given 07/29/18 0855)  iopamidol (ISOVUE-300) 61 % injection 100 mL (100 mLs Intravenous Contrast Given 07/29/18 0915)    Vitals:   07/29/18 0654 07/29/18 0655 07/29/18 0922  07/29/18 0930  BP: (!) 142/87  137/74 128/77  Pulse: 88     Resp: 18     Temp: 98.4 F (36.9 C)     TempSrc: Oral     SpO2: 99%  98%   Weight:  79.8 kg    Height:   (1.575 m)      Final diagnoses:  Chest pressure  Upper abdominal pain  Pyelonephritis     Final Clinical Impressions(s) / ED Diagnoses   Final diagnoses:  Chest pressure  Upper abdominal pain  Pyelonephritis    ED Discharge Orders         Ordered    cephALEXin (KEFLEX) 500 MG capsule  2 times daily     07/29/18 1019           Blane Ohara, MD 07/29/18 [phone]

## 2018-07-29 NOTE — ED Notes (Signed)
Pt returns from radiology. 

## 2018-07-29 NOTE — ED Triage Notes (Signed)
Patient states she woke up with sharp abd. Pain states she is finishes her menstrual period and thought that was the problem took a Midol however it didn't help. States she vomited x 1 had to bowel movements states they were normal. Now c/o mid back pain and chest pain, rates 7/10 Denies sob.

## 2018-07-30 LAB — URINE CULTURE

## 2018-12-11 ENCOUNTER — Emergency Department (HOSPITAL_COMMUNITY): Payer: Self-pay

## 2018-12-11 ENCOUNTER — Other Ambulatory Visit: Payer: Self-pay

## 2018-12-11 ENCOUNTER — Encounter (HOSPITAL_COMMUNITY): Payer: Self-pay | Admitting: Emergency Medicine

## 2018-12-11 ENCOUNTER — Emergency Department (HOSPITAL_COMMUNITY)
Admission: EM | Admit: 2018-12-11 | Discharge: 2018-12-12 | Disposition: A | Discharge status: Home / Self Care | Payer: Self-pay | Attending: Emergency Medicine | Admitting: Emergency Medicine

## 2018-12-11 DIAGNOSIS — M79641 Pain in right hand: Secondary | ICD-10-CM | POA: Insufficient documentation

## 2018-12-11 DIAGNOSIS — Z5321 Procedure and treatment not carried out due to patient leaving prior to being seen by health care provider: Secondary | ICD-10-CM | POA: Insufficient documentation
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
## 2018-12-12 NOTE — ED Notes (Signed)
Pt. Not answering for vitals reassess x2.  

## 2018-12-12 NOTE — ED Notes (Signed)
Called pt for room x3. No answer.

## 2019-03-13 ENCOUNTER — Encounter (HOSPITAL_COMMUNITY): Payer: Self-pay | Admitting: *Deleted

## 2019-03-13 ENCOUNTER — Other Ambulatory Visit: Payer: Self-pay

## 2019-03-13 ENCOUNTER — Emergency Department (HOSPITAL_COMMUNITY)
Admission: EM | Admit: 2019-03-13 | Discharge: 2019-03-13 | Disposition: A | Discharge status: Home / Self Care | Payer: Self-pay | Attending: Emergency Medicine | Admitting: Emergency Medicine

## 2019-03-13 DIAGNOSIS — T148XXA Other injury of unspecified body region, initial encounter: Secondary | ICD-10-CM

## 2019-03-13 DIAGNOSIS — Y929 Unspecified place or not applicable: Secondary | ICD-10-CM | POA: Insufficient documentation

## 2019-03-13 DIAGNOSIS — W25XXXA Contact with sharp glass, initial encounter: Secondary | ICD-10-CM | POA: Insufficient documentation

## 2019-03-13 DIAGNOSIS — S61511A Laceration without foreign body of right wrist, initial encounter: Secondary | ICD-10-CM | POA: Insufficient documentation

## 2019-03-13 DIAGNOSIS — Z79899 Other long term (current) drug therapy: Secondary | ICD-10-CM | POA: Insufficient documentation

## 2019-03-13 DIAGNOSIS — Y999 Unspecified external cause status: Secondary | ICD-10-CM | POA: Insufficient documentation

## 2019-03-13 DIAGNOSIS — Y9389 Activity, other specified: Secondary | ICD-10-CM | POA: Insufficient documentation

## 2019-03-13 NOTE — Discharge Instructions (Signed)
We recommend Tylenol or ibuprofen for management of soreness.  Your derma clip/butterfly bandage will fall off on its own.  Keep this in place as long as possible.  Do not apply peroxide or alcohol to the wound as this will cause discomfort as well as prolong wound healing.  You may use topical Neosporin or bacitracin for infection prevention.  After 1 week, we advise the use of vitamin E oil to limit scarring.  Follow-up with a primary care doctor for wound recheck as needed.

## 2019-03-13 NOTE — ED Provider Notes (Signed)
[facility] Provider Note   CSN: [id number] Arrival date & time: 03/13/19  2015     History Chief Complaint  Patient presents with  . Laceration    Rachel AmberMegan Guimond is a 23 y.o. female.   23 year old female presents to the emergency department for evaluation of laceration to her right wrist.  Incident occurred around 2000 tonight.  She was mad about her boyfriend and punched a window in the basement.  Sustained a small laceration with bleeding controlled prior to arrival.  She complains of some soreness and stinging with palpation.  No medications taken PTA.  Last tetanus updated in 2016.  The history is provided by the patient. No language interpreter was used.  Laceration      Past Medical History:  Diagnosis Date  . Ankle fracture   . Anxiety   . Depression    zoloft  . Obesity   . Ovarian cyst     Patient Active Problem List   Diagnosis Date Noted  . NSVD (normal spontaneous vaginal delivery) 02/16/2015  . History of postpartum depression, currently pregnant 02/16/2015  . Active labor at term 02/14/2015  . Obesity during pregnancy in second trimester     Past Surgical History:  Procedure Laterality Date  . NO PAST SURGERIES       OB History    Gravida  3   Para  2   Term  2   Preterm      AB  1   Living  2     SAB  1   TAB      Ectopic      Multiple  0   Live Births  2        Obstetric Comments  Pt stated she lost consciousness during labor due to intense pain. After receiving epidural birth went well.         Family History  Problem Relation Age of Onset  . Hypertension Maternal Grandmother   . Diabetes Maternal Grandmother   . Stroke Maternal Grandmother   . Cancer Maternal Grandmother        uterine  . Depression Maternal Grandmother   . Diabetes Paternal Grandfather   . Depression Mother   . Mental illness Mother   . Hypertension Mother   . Cancer Mother     Social History    Tobacco Use  . Smoking status: Never Smoker  . Smokeless tobacco: Never Used  Substance Use Topics  . Alcohol use: Yes  . Drug use: No    Home Medications Prior to Admission medications   Medication Sig Start Date End Date Taking? Authorizing Provider  acetaminophen (TYLENOL) 500 MG tablet Take 500 mg by mouth every 6 (six) hours as needed for mild pain or headache.     [provider]  cephALEXin (KEFLEX) 500 MG capsule Take 1 capsule (500 mg total) by mouth 2 (two) times daily. 07/30/18   Blane OharaZavitz, Joshua, MD  ibuprofen (ADVIL) 200 MG tablet Take 400 mg by mouth every 6 (six) hours as needed for moderate pain.    [provider]  ondansetron (ZOFRAN ODT) 4 MG disintegrating tablet Take 1 tablet (4 mg total) by mouth every 8 (eight) hours as needed for nausea or vomiting. Patient not taking: Reported on 07/29/2018 04/11/16   Gerhard MunchLockwood, Robert, MD    Allergies    Amoxicillin  Review of Systems   Review of Systems  Ten systems reviewed and are negative for acute change,  except as noted in the HPI.    Physical Exam Updated Vital Signs BP 121/61 (BP Location: Left Arm)   Pulse 92   Temp 98.5 F (36.9 C)   Resp (!) 28   Ht 5\' 2"  (1.575 m)   Wt 90.7 kg   LMP 02/11/2019   SpO2 99%   BMI 36.58 kg/m   Physical Exam Vitals and nursing note reviewed.  Constitutional:      General: She is not in acute distress.    Appearance: She is well-developed. She is not diaphoretic.     Comments: Nontoxic-appearing and in no distress  HENT:     Head: Normocephalic and atraumatic.  Eyes:     General: No scleral icterus.    Conjunctiva/sclera: Conjunctivae normal.  Cardiovascular:     Rate and Rhythm: Normal rate and regular rhythm.     Pulses: Normal pulses.     Comments: Distal radial pulse 2+ in the right upper extremity.  Capillary refill brisk in all digits of the right hand. Pulmonary:     Effort: Pulmonary effort is normal. No respiratory distress.     Comments:  Respirations even and unlabored Musculoskeletal:        General: Normal range of motion.     Cervical back: Normal range of motion.  Skin:    General: Skin is warm and dry.     Coloration: Skin is not pale.     Findings: No erythema or rash.     Comments: 2 cm superficial, linear laceration to the volar right wrist.  No active bleeding.  Laceration does not extend beyond the dermal layer.  No exposed subcutaneous tissue.  Neurological:     General: No focal deficit present.     Mental Status: She is alert and oriented to person, place, and time.     Coordination: Coordination normal.     Comments: Sensation to light touch intact.  Patient able to wiggle fingers.  Psychiatric:        Behavior: Behavior normal.     ED Results / Procedures / Treatments   Labs (all labs ordered are listed, but only abnormal results are displayed) Labs Reviewed - No data to display  EKG None  Radiology No results found.  Procedures Procedures (including critical care time)  LACERATION REPAIR Performed by: 02/13/2019 Authorized by: Antony Madura Consent: Verbal consent obtained. Risks and benefits: risks, benefits and alternatives were discussed Consent given by: patient Patient identity confirmed: provided demographic data Prepped and Draped in normal sterile fashion Wound explored  Laceration Location: R wrist  Laceration Length: 2cm  No Foreign Bodies seen or palpated  Amount of cleaning: standard  Skin closure: DermaClips  Number of sutures: N/A  Technique: simple  Patient tolerance: Patient tolerated the procedure well with no immediate complications.   Medications Ordered in ED Medications - No data to display  ED Course  I have reviewed the triage vital signs and the nursing notes.  Pertinent labs & imaging results that were available during my care of the patient were reviewed by me and considered in my medical decision making (see chart for details).    MDM  Rules/Calculators/A&P   23 year old female presents for laceration to her volar right wrist after punching a glass window.  She is neurovascularly intact.  Laceration repaired with dermal clips.  Tetanus up-to-date.  Pt has no comorbidities to effect normal wound healing. Discussed home care with patient and answered questions. Pt to follow up for wound check  with her PCP PRN. Return precautions discussed and provided. Patient discharged in stable condition with no unaddressed concerns.   Final Clinical Impression(s) / ED Diagnoses Final diagnoses:  Superficial laceration    Rx / DC Orders ED Discharge Orders    None       Antonietta Breach, PA-C 03/13/19 2241    Tegeler, Gwenyth Allegra, MD 03/14/19 0006

## 2019-03-13 NOTE — ED Triage Notes (Signed)
The pts rt hand went through a house glass she has a small  Cut 1" to her inner wrist minimal bleeding at   The present time

## 2019-04-10 ENCOUNTER — Emergency Department (HOSPITAL_COMMUNITY)
Admission: EM | Admit: 2019-04-10 | Discharge: 2019-04-11 | Disposition: A | Discharge status: Home / Self Care | Payer: Self-pay | Attending: Emergency Medicine | Admitting: Emergency Medicine

## 2019-04-10 ENCOUNTER — Encounter (HOSPITAL_COMMUNITY): Payer: Self-pay | Admitting: Emergency Medicine

## 2019-04-10 ENCOUNTER — Emergency Department (HOSPITAL_COMMUNITY): Payer: Self-pay

## 2019-04-10 ENCOUNTER — Other Ambulatory Visit: Payer: Self-pay

## 2019-04-10 DIAGNOSIS — R1031 Right lower quadrant pain: Secondary | ICD-10-CM | POA: Insufficient documentation

## 2019-04-10 DIAGNOSIS — Z5321 Procedure and treatment not carried out due to patient leaving prior to being seen by health care provider: Secondary | ICD-10-CM | POA: Insufficient documentation

## 2019-04-10 DIAGNOSIS — R0789 Other chest pain: Secondary | ICD-10-CM | POA: Insufficient documentation

## 2019-04-10 DIAGNOSIS — R12 Heartburn: Secondary | ICD-10-CM | POA: Insufficient documentation

## 2019-04-10 LAB — COMPREHENSIVE METABOLIC PANEL
ALT: 13 U/L (ref 0–44)
AST: 16 U/L (ref 15–41)
Albumin: 4.1 g/dL (ref 3.5–5.0)
Alkaline Phosphatase: 54 U/L (ref 38–126)
Anion gap: 8 (ref 5–15)
BUN: 11 mg/dL (ref 6–20)
CO2: 24 mmol/L (ref 22–32)
Calcium: 9.2 mg/dL (ref 8.9–10.3)
Chloride: 103 mmol/L (ref 98–111)
Creatinine, Ser: 0.75 mg/dL (ref 0.44–1.00)
GFR calc Af Amer: >60 mL/min (ref >60)
GFR calc non Af Amer: >60 mL/min (ref >60)
Glucose, Bld: 93 mg/dL (ref 70–99)
Potassium: 3.6 mmol/L (ref 3.5–5.1)
Sodium: 135 mmol/L (ref 135–145)
Total Bilirubin: 0.5 mg/dL (ref 0.3–1.2)
Total Protein: 7.1 g/dL (ref 6.5–8.1)

## 2019-04-10 LAB — TROPONIN I (HIGH SENSITIVITY): Troponin I (High Sensitivity): <2 ng/L (ref <18)

## 2019-04-10 LAB — CBC
HCT: 43.1 % (ref 36.0–46.0)
Hemoglobin: 14.4 g/dL (ref 12.0–15.0)
MCH: 29.1 pg (ref 26.0–34.0)
MCHC: 33.4 g/dL (ref 30.0–36.0)
MCV: 87.2 fL (ref 80.0–100.0)
Platelets: 292 10*3/uL (ref 150–400)
RBC: 4.94 MIL/uL (ref 3.87–5.11)
RDW: 12.3 % (ref 11.5–15.5)
WBC: 8.9 10*3/uL (ref 4.0–10.5)
nRBC: 0 % (ref 0.0–0.2)

## 2019-04-10 LAB — I-STAT BETA HCG BLOOD, ED (MC, WL, AP ONLY): I-stat hCG, quantitative: <5 m[IU]/mL (ref <5)

## 2019-04-10 LAB — LIPASE, BLOOD: Lipase: 18 U/L (ref 11–51)

## 2019-04-10 NOTE — ED Triage Notes (Signed)
Pt BIB [facility], reports having intermittent heartburn for 1.5 weeks, reports central CP earlier today that has resolved, also reports intermittent RLQ abdominal pain since Thursday.

## 2019-04-11 NOTE — ED Notes (Signed)
Pt did not respond when called for repeat lab and has not been seen in the lobby for 1.0hr plus

## 2020-01-09 ENCOUNTER — Encounter (HOSPITAL_COMMUNITY): Payer: Self-pay | Admitting: *Deleted

## 2020-01-09 ENCOUNTER — Ambulatory Visit (HOSPITAL_COMMUNITY)
Admission: EM | Admit: 2020-01-09 | Discharge: 2020-01-09 | Disposition: A | Discharge status: Home / Self Care | Payer: Self-pay | Attending: Family Medicine | Admitting: Family Medicine

## 2020-01-09 ENCOUNTER — Telehealth (HOSPITAL_COMMUNITY): Payer: Self-pay | Admitting: Family Medicine

## 2020-01-09 ENCOUNTER — Other Ambulatory Visit: Payer: Self-pay

## 2020-01-09 ENCOUNTER — Telehealth (HOSPITAL_COMMUNITY): Payer: Self-pay | Admitting: Emergency Medicine

## 2020-01-09 DIAGNOSIS — K047 Periapical abscess without sinus: Secondary | ICD-10-CM

## 2020-01-09 MED ORDER — CLINDAMYCIN HCL 300 MG PO CAPS: 300.0000 mg | ORAL_CAPSULE | Freq: Three times a day (TID) | ORAL | 0 refills | Status: DC

## 2020-01-09 MED ORDER — TRAMADOL HCL 50 MG PO TABS: 50.0000 mg | ORAL_TABLET | Freq: Four times a day (QID) | ORAL | 0 refills | Status: DC | PRN

## 2020-01-09 MED ORDER — CLINDAMYCIN HCL 300 MG PO CAPS
300.0000 mg | ORAL_CAPSULE | Freq: Three times a day (TID) | ORAL | 0 refills | Status: DC
Start: 2020-01-09 — End: 2020-05-02

## 2020-01-09 MED ORDER — TRAMADOL HCL 50 MG PO TABS
50.0000 mg | ORAL_TABLET | Freq: Four times a day (QID) | ORAL | 0 refills | Status: DC | PRN
Start: 2020-01-09 — End: 2020-05-02

## 2020-01-09 NOTE — Telephone Encounter (Signed)
Pt called asking medications to be resent to pharmacy. She states the prescription did not go thru.

## 2020-01-09 NOTE — Telephone Encounter (Signed)
Opened in error

## 2020-01-09 NOTE — Discharge Instructions (Signed)
Take the antibiotic 3 times a day.  Please take with food May take Tylenol or Motrin for moderate pain Take tramadol if needed for more severe pain See dentist in follow-up to prevent recurrence

## 2020-01-09 NOTE — ED Triage Notes (Signed)
Pt reports Lt facial swelling started Sunday. Pt has swelling to Lt side of face. Pt also reported Lt sided Rib pain. Pt  May have cavity to tooth on Lt side.

## 2020-01-10 NOTE — ED Provider Notes (Signed)
[facility]    CSN: [id number] Arrival date & time: 01/09/20  1513      History   Chief Complaint Chief Complaint  Patient presents with   Facial Pain   Chest Pain    Lt rib pain     HPI Rachel Stuart is a 24 y.o. female.   HPI  Patient has known poor dentition.  Caries and fractures.  She states in the left jaw towards the back molar she has a broken teeth.  This area is painful to chew or bite.  Left side of her face is swollen.  She has a dental infection.  She states it is very painful.  She also states the ribs on her left side of been hurting for the last couple of days.  This is unrelated.  No cough or shortness of breath.  She does not recall any trauma but she does a lot of lifting because she has small children at home.  Past Medical History:  Diagnosis Date   Ankle fracture    Anxiety    Depression    zoloft   Obesity    Ovarian cyst     Patient Active Problem List   Diagnosis Date Noted   NSVD (normal spontaneous vaginal delivery) 02/16/2015   History of postpartum depression, currently pregnant 02/16/2015   Active labor at term 02/14/2015   Obesity during pregnancy in second trimester     Past Surgical History:  Procedure Laterality Date   NO PAST SURGERIES      OB History    Gravida  3   Para  2   Term  2   Preterm      AB  1   Living  2     SAB  1   TAB      Ectopic      Multiple  0   Live Births  2        Obstetric Comments  Pt stated she lost consciousness during labor due to intense pain. After receiving epidural birth went well.          Home Medications    Prior to Admission medications   Medication Sig Start Date End Date Taking? Authorizing Provider  acetaminophen (TYLENOL) 500 MG tablet Take 500 mg by mouth every 6 (six) hours as needed for mild pain or headache.    Yes [provider]  ibuprofen (ADVIL) 200 MG tablet Take 400 mg by mouth every 6 (six) hours as needed for  moderate pain.   Yes [provider]  clindamycin (CLEOCIN) 300 MG capsule Take 1 capsule (300 mg total) by mouth 3 (three) times daily. 01/09/20   Eustace Moore, MD  ondansetron (ZOFRAN ODT) 4 MG disintegrating tablet Take 1 tablet (4 mg total) by mouth every 8 (eight) hours as needed for nausea or vomiting. Patient not taking: Reported on 07/29/2018 04/11/16   Gerhard Munch, MD  traMADol (ULTRAM) 50 MG tablet Take 1 tablet (50 mg total) by mouth every 6 (six) hours as needed. 01/09/20   Eustace Moore, MD    Family History Family History  Problem Relation Age of Onset   Hypertension Maternal Grandmother    Diabetes Maternal Grandmother    Stroke Maternal Grandmother    Cancer Maternal Grandmother        uterine   Depression Maternal Grandmother    Diabetes Paternal Grandfather    Depression Mother    Mental illness Mother    Hypertension  Mother    Cancer Mother     Social History Social History   Tobacco Use   Smoking status: Never Smoker   Smokeless tobacco: Never Used  Substance Use Topics   Alcohol use: Yes   Drug use: No     Allergies   Amoxicillin   Review of Systems Review of Systems See HPI  Physical Exam Triage Vital Signs ED Triage Vitals  Enc Vitals Group     BP 01/09/20 1710 119/78     Pulse Rate 01/09/20 1710 81     Resp 01/09/20 1710 16     Temp 01/09/20 1710 99.6 F (37.6 C)     Temp Source 01/09/20 1710 Oral     SpO2 01/09/20 1710 100 %     Weight --      Height 01/09/20 1714 5\' 4"  (1.626 m)     Head Circumference --      Peak Flow --      Pain Score 01/09/20 1713 5     Pain Loc --      Pain Edu? --      Excl. in GC? --    No data found.  Updated Vital Signs BP 119/78 (BP Location: Left Arm)    Pulse 81    Temp 99.6 F (37.6 C) (Oral)    Resp 16    Ht 5\' 4"  (1.626 m)    LMP 12/29/2019    SpO2 100%    BMI 34.33 kg/m     Physical Exam Constitutional:      General: She is not in acute distress.     Appearance: She is well-developed. She is obese.  HENT:     Head: Normocephalic and atraumatic.      Mouth/Throat:   Eyes:     Conjunctiva/sclera: Conjunctivae normal.     Pupils: Pupils are equal, round, and reactive to light.  Cardiovascular:     Rate and Rhythm: Normal rate.  Pulmonary:     Effort: Pulmonary effort is normal. No respiratory distress.     Comments: Lungs are clear, no chest wall tenderness or rib tenderness Chest:     Chest wall: No tenderness.  Abdominal:     General: There is no distension.     Palpations: Abdomen is soft.  Musculoskeletal:        General: Normal range of motion.     Cervical back: Normal range of motion.  Skin:    General: Skin is warm and dry.  Neurological:     Mental Status: She is alert.  Psychiatric:        Behavior: Behavior normal.      UC Treatments / Results  Labs (all labs ordered are listed, but only abnormal results are displayed) Labs Reviewed - No data to display  EKG   Radiology No results found.  Procedures Procedures (including critical care time)  Medications Ordered in UC Medications - No data to display  Initial Impression / Assessment and Plan / UC Course  I have reviewed the triage vital signs and the nursing notes.  Pertinent labs & imaging results that were available during my care of the patient were reviewed by me and considered in my medical decision making (see chart for details).     Reviewed the importance of seeing a dentist to prevent recurrences of her dental abscess and infection Final Clinical Impressions(s) / UC Diagnoses   Final diagnoses:  Dental infection     Discharge Instructions  Take the antibiotic 3 times a day.  Please take with food May take Tylenol or Motrin for moderate pain Take tramadol if needed for more severe pain See dentist in follow-up to prevent recurrence   ED Prescriptions    Medication Sig Dispense Auth. Provider   clindamycin (CLEOCIN) 300 MG  capsule Take 1 capsule (300 mg total) by mouth 3 (three) times daily. 21 capsule Eustace Moore, MD   traMADol (ULTRAM) 50 MG tablet Take 1 tablet (50 mg total) by mouth every 6 (six) hours as needed. 15 tablet Eustace Moore, MD     I have reviewed the PDMP during this encounter.   Eustace Moore, MD 01/10/20 [phone]

## 2020-03-31 NOTE — L&D Delivery Note (Signed)
[facility] Delivery Note  Rachel Stuart is a 25 y.o. [id number] s/p SVD at [redacted]w[redacted]d. She was admitted for SROM.   ROM: 9h 29m with meconium stained fluid GBS Status: Negative Maximum Maternal Temperature: 98.7  Labor Progress: Admitted for SROM and made change with pitocin augmentation.  Delivery Date/Time: 10/29/2020 @ 1908 Delivery: Called to room for amniotic sac at the vaginal introitus. Ruptured amniotic sac for dark fluid. Cervix checked and complete with head +2. Head delivered OA. No nuchal cord present. Shoulder and body delivered in usual fashion. Infant with spontaneous cry, placed on mother's abdomen, dried and stimulated. Cord clamped x 2 after 1-minute delay, and cut by Mother. Cord blood drawn. Then had patient push again and placenta delivered with Twin B. Fundus firm with massage and Pitocin. Labia, perineum, vagina, and cervix inspected inspected with no.   Placenta: Intact, 2 umbilical cords with 3 vessels each Complications: IUFD Twin B Lacerations: None EBL: 150 Analgesia: Epidural  Postpartum Planning [ ]  message to sent to schedule follow-up  [ ]  vaccines UTD  Infant: Twin A Viable Female  APGARs 9/9   Twin B, IUFD  MD 10/29/2020, 7:31 PM

## 2020-05-02 ENCOUNTER — Other Ambulatory Visit: Payer: Self-pay

## 2020-05-02 ENCOUNTER — Inpatient Hospital Stay (HOSPITAL_COMMUNITY): Payer: Self-pay

## 2020-05-02 ENCOUNTER — Encounter (HOSPITAL_COMMUNITY): Payer: Self-pay | Admitting: Obstetrics and Gynecology

## 2020-05-02 ENCOUNTER — Inpatient Hospital Stay (HOSPITAL_COMMUNITY)
Admission: AD | Admit: 2020-05-02 | Discharge: 2020-05-02 | Disposition: A | Discharge status: Home / Self Care | Payer: Self-pay | Attending: Obstetrics and Gynecology | Admitting: Obstetrics and Gynecology

## 2020-05-02 DIAGNOSIS — O210 Mild hyperemesis gravidarum: Secondary | ICD-10-CM

## 2020-05-02 DIAGNOSIS — Z3A11 11 weeks gestation of pregnancy: Secondary | ICD-10-CM | POA: Insufficient documentation

## 2020-05-02 DIAGNOSIS — O99211 Obesity complicating pregnancy, first trimester: Secondary | ICD-10-CM | POA: Insufficient documentation

## 2020-05-02 DIAGNOSIS — O99891 Other specified diseases and conditions complicating pregnancy: Secondary | ICD-10-CM

## 2020-05-02 DIAGNOSIS — O219 Vomiting of pregnancy, unspecified: Secondary | ICD-10-CM | POA: Insufficient documentation

## 2020-05-02 DIAGNOSIS — R109 Unspecified abdominal pain: Secondary | ICD-10-CM

## 2020-05-02 DIAGNOSIS — O30031 Twin pregnancy, monochorionic/diamniotic, first trimester: Secondary | ICD-10-CM

## 2020-05-02 DIAGNOSIS — M549 Dorsalgia, unspecified: Secondary | ICD-10-CM

## 2020-05-02 DIAGNOSIS — O26899 Other specified pregnancy related conditions, unspecified trimester: Secondary | ICD-10-CM

## 2020-05-02 DIAGNOSIS — O26891 Other specified pregnancy related conditions, first trimester: Secondary | ICD-10-CM

## 2020-05-02 DIAGNOSIS — Z3A10 10 weeks gestation of pregnancy: Secondary | ICD-10-CM

## 2020-05-02 LAB — COMPREHENSIVE METABOLIC PANEL
ALT: 17 U/L (ref 0–44)
AST: 13 U/L — ABNORMAL LOW (ref 15–41)
Albumin: 3.3 g/dL — ABNORMAL LOW (ref 3.5–5.0)
Alkaline Phosphatase: 53 U/L (ref 38–126)
Anion gap: 11 (ref 5–15)
BUN: <5 mg/dL — ABNORMAL LOW (ref 6–20)
CO2: 21 mmol/L — ABNORMAL LOW (ref 22–32)
Calcium: 8.9 mg/dL (ref 8.9–10.3)
Chloride: 103 mmol/L (ref 98–111)
Creatinine, Ser: 0.66 mg/dL (ref 0.44–1.00)
GFR, Estimated: >60 mL/min (ref >60)
Glucose, Bld: 88 mg/dL (ref 70–99)
Potassium: 3.7 mmol/L (ref 3.5–5.1)
Sodium: 135 mmol/L (ref 135–145)
Total Bilirubin: 0.7 mg/dL (ref 0.3–1.2)
Total Protein: 6.1 g/dL — ABNORMAL LOW (ref 6.5–8.1)

## 2020-05-02 LAB — URINALYSIS, ROUTINE W REFLEX MICROSCOPIC
Bilirubin Urine: NEGATIVE
Glucose, UA: NEGATIVE mg/dL
Hgb urine dipstick: NEGATIVE
Ketones, ur: NEGATIVE mg/dL
Nitrite: NEGATIVE
Protein, ur: NEGATIVE mg/dL
Specific Gravity, Urine: 1.017 (ref 1.005–1.030)
pH: 7 (ref 5.0–8.0)

## 2020-05-02 LAB — CBC
HCT: 37.5 % (ref 36.0–46.0)
Hemoglobin: 13.4 g/dL (ref 12.0–15.0)
MCH: 30.5 pg (ref 26.0–34.0)
MCHC: 35.7 g/dL (ref 30.0–36.0)
MCV: 85.4 fL (ref 80.0–100.0)
Platelets: 260 10*3/uL (ref 150–400)
RBC: 4.39 MIL/uL (ref 3.87–5.11)
RDW: 12.6 % (ref 11.5–15.5)
WBC: 8.6 10*3/uL (ref 4.0–10.5)
nRBC: 0 % (ref 0.0–0.2)

## 2020-05-02 LAB — LIPASE, BLOOD: Lipase: 22 U/L (ref 11–51)

## 2020-05-02 LAB — HCG, QUANTITATIVE, PREGNANCY: hCG, Beta Chain, Quant, S: 108891 m[IU]/mL — ABNORMAL HIGH (ref <5)

## 2020-05-02 LAB — POCT PREGNANCY, URINE: Preg Test, Ur: POSITIVE — AB

## 2020-05-02 MED ORDER — CYCLOBENZAPRINE HCL 5 MG PO TABS: 5.0000 mg | ORAL_TABLET | Freq: Three times a day (TID) | ORAL | 0 refills | Status: DC | PRN

## 2020-05-02 MED ORDER — ONDANSETRON 4 MG PO TBDP: 4.0000 mg | ORAL_TABLET | Freq: Three times a day (TID) | ORAL | 0 refills | Status: DC | PRN

## 2020-05-02 MED ORDER — CYCLOBENZAPRINE HCL 5 MG PO TABS
10.0000 mg | ORAL_TABLET | Freq: Once | ORAL | Status: AC
  Administered 2020-05-02: 10 mg via ORAL
  Filled 2020-05-02: qty 2

## 2020-05-02 MED ORDER — ACETAMINOPHEN 500 MG PO TABS
1000.0000 mg | ORAL_TABLET | Freq: Once | ORAL | Status: AC
  Administered 2020-05-02: 1000 mg via ORAL
  Filled 2020-05-02: qty 2

## 2020-05-02 MED ORDER — ONDANSETRON 4 MG PO TBDP
8.0000 mg | ORAL_TABLET | Freq: Once | ORAL | Status: AC
  Administered 2020-05-02: 8 mg via ORAL
  Filled 2020-05-02: qty 2

## 2020-05-02 NOTE — MAU Note (Signed)
Rachel Stuart is a 25 y.o. at [redacted]w[redacted]d here in MAU reporting: back pain for the past 2-3 weeks, states it is mid back. Pain is constant and it has been making it hard for her to sleep. States she currently feels nauseated and weak. No bleeding or discharge.  LMP: 02/12/20  Onset of complaint: ongoing  Pain score: 8/10  Vitals:   05/02/20 1433  BP: 115/64  Pulse: 79  Resp: 16  Temp: 98.7 F (37.1 C)  SpO2: 100%     Lab orders placed from triage: ua, upt

## 2020-05-02 NOTE — Discharge Instructions (Signed)
[facility] [employer] [facility] [employer] at [facility]  [address], [place], [place] [address]  [phone]  Center for [facility] at [facility]  [address], [place], [place] [address]  [phone]  Center for [facility] Healthcare at [facility] [address] [place], [place] [address]  [phone]  Center for [facility] Healthcare at [facility]  [address] [place], [place] [address]  [phone]  Center for [facility] at [facility]  [address]First floor), [place], [place] [address]  [phone]  Center for [facility] at [facility] [address], [place], [place] [address] [phone]  Center for [facility] Healthcare at [facility]  [address] [place], [place], [place] [address]  [phone]  [facility]  [address], [place], [place] [address]  [phone]  [facility]  [address] [place], [place], [place] [address]  [phone]  [facility]  [address] [place], [place] [address]  [phone]  [facility]  [address] [place], [place] [address]  [phone]  [facility]  [address], [place], [place] [address]  [phone]  [facility]   [address] [place], [place] [address]  [phone]  [facility]  [address], [place], [place] [address]   [phone]  [facility]  [address] [place], [place] [address]  [phone]          National Guideline Alliance ([place]).Twin and Triplet Pregnancy. [place]: [employer] for [employer] and [employer] ([place]); 2019.">  Multiple Pregnancy Multiple pregnancy means that a woman is carrying more than one baby at a time. She may be pregnant with twins, triplets, or more. The majority of multiple pregnancies are twins. Naturally conceiving triplets or more (higher-order multiples) is rare. Multiple  pregnancies are riskier than single pregnancies. A woman with a multiple pregnancy is more likely to have certain problems during her pregnancy. How does a multiple pregnancy happen? A multiple pregnancy happens when:  The woman's body releases more than one egg at a time, and then each egg gets fertilized by a different sperm. ? This is the most common type of multiple pregnancy. ? Twins or other multiples produced this way are called fraternal. They are no more alike than non-multiple siblings are.  One sperm fertilizes one egg, which then divides into more than one embryo. ? Twins or other multiples produced this way are called identical. Identical multiples are always the same gender, and they look very much alike.   Who is most likely to have a multiple pregnancy? A multiple pregnancy is more likely to develop in women who:  Have had fertility treatment, especially if the treatment included fertility medicines.  Are older than 27 years of age.  Have already had four or more children.  Have a family history of multiple pregnancy. How is a multiple pregnancy diagnosed? A multiple pregnancy may be diagnosed based on:  Symptoms such as: ? Rapid weight gain in the first 3 months of pregnancy (first trimester). ? More severe nausea and breast tenderness than what is typical of a single pregnancy. ? A larger uterus than what is normal for the stage of the pregnancy.  Blood tests that detect a higher-than-normal level of human chorionic gonadotropin (hCG). This is a hormone that your body produces in early pregnancy.  An ultrasound  exam. This is used to confirm that you are carrying multiples. What risks come with multiple pregnancy? A multiple pregnancy puts you at a higher risk for certain problems during or after your pregnancy. These include:  Delivering your babies before your due date (preterm birth). A full-term pregnancy lasts for at least 37 weeks. ? Babies born before 37  weeks may have a higher risk for breathing problems, feeding difficulties, cerebral palsy, and learning disabilities.  Diabetes.  Preeclampsia. This is a serious condition that causes high blood pressure and headaches during pregnancy.  Too much blood loss after childbirth (postpartum hemorrhage).  Postpartum depression.  Low birth weight of the babies. How will having a multiple pregnancy affect my care? Your health care team will monitor you more closely. You may need more frequent prenatal visits. This will ensure that you are healthy and that your babies are growing normally. Follow these instructions at home: Eating and drinking  Increase your nutrition. ? Follow your health care provider's recommendations for weight gain. You may need to gain a little extra weight when you are pregnant with multiples. ? Eat healthy snacks often throughout the day. This will add calories and reduce nausea.  Drink enough fluid to keep your urine pale yellow.  Take prenatal vitamins. Ask your health care provider what vitamins are right for you. Activity Limit your activities by 20-24 weeks of pregnancy.  Rest often.  Avoid activities, exercise, and work that take a lot of effort.  Ask your health care provider when you should stop having sex. General instructions  Do not use any products that contain nicotine or tobacco, such as cigarettes, e-cigarettes, and chewing tobacco. If you need help quitting, ask your health care provider.  Do not drink alcohol or use illegal drugs.  Take over-the-counter and prescription medicines only as told by your health care provider.  Arrange for extra help around the house.  Keep all follow-up visits and all prenatal visits as told by your health care provider. This is important. Where to find more information  [employer] of Obstetricians and Gynecology: www.acog.org Contact a health care provider if:  You have dizziness.  You have nausea,  vomiting, or diarrhea that does not go away.  You have depression or other emotions that are interfering with your normal activities.  You have a fever.  You have pain with urination.  You have a bad-smelling vaginal discharge.  You notice increased swelling in your face, hands, legs, or ankles. Get help right away if:  You have fluid leaking from your vagina.  You have bleeding from your vagina.  You have pelvic cramps, pelvic pressure, or nagging pain in your abdomen or lower back.  You are having regular contractions.  You have a severe headache, with or without changes in how you see.  You have chest pain or shortness of breath.  You notice that your babies move less often, or do not move at all. Summary  Having a multiple pregnancy means that a woman is carrying more than one baby at a time.  A multiple pregnancy puts you at a higher risk for delivering your babies before your due date, having diabetes, preeclampsia, too much blood loss after childbirth, or low birth weight of the babies.  Your health care provider will monitor you more closely during your pregnancy.  You may need to make some lifestyle changes during pregnancy. This includes eating more, limiting your activities after 20-24 weeks of pregnancy, and arranging for extra help  around the house.  Follow up with your health care provider as instructed if you experience any complications. This information is not intended to replace advice given to you by your health care provider. Make sure you discuss any questions you have with your health care provider. Document Revised: 11/08/2018 Document Reviewed: 11/08/2018 Elsevier Patient Education  2021 [employer].

## 2020-05-02 NOTE — MAU Provider Note (Addendum)
[place] Chief Complaint:  Back Pain and Nausea   Event Date/Time   First Provider Initiated Contact with Patient 05/02/20 1514      HPI: Rachel Stuart is a 25 y.o. [id number] at [redacted]w[redacted]d by LMP who presents to maternity admissions reporting back pain that started 2-3 weeks ago and has been worsening since. She came to be seen today because she has felt nauseous, weak, and dizzy today when standing and walking, but she mostly attributes this to a lack of water intake. She currently rates the pain an 8/10. She has been taking Tylenol occasionally for the back pain, but it is no longer as helpful. Patient has never had issues with back pain in the past, including during pregnancy.  Patient has not yet been seen at [facility] clinic for pregnancy and has no plan for prenatal care due to lack of insurance. Per patient, she has not been seen by provider in 3-4 years. Patient endorses nausea and vomiting throughout 1st trimester. She denies LOF, vaginal bleeding, vaginal itching/burning, urinary symptoms, h/a, or fever/chills.     Past Medical History: Past Medical History:  Diagnosis Date   Ankle fracture    Anxiety    Depression    zoloft   Obesity    Ovarian cyst     Past obstetric history: OB History  Gravida Para Term Preterm AB Living  4 2 2   1 2   SAB IAB Ectopic Multiple Live Births  1     0 2    # Outcome Date GA Lbr Len/2nd Weight Sex Delivery Anes PTL Lv  4 Current           3 Term 02/15/15 [redacted]w[redacted]d 08:59 / 00:46 3495 g F Vag-Spont EPI  LIV  2 SAB 2015          1 Term 02/26/10   3487 g F Vag-Spont EPI  LIV    Obstetric Comments  Pt stated she lost consciousness during labor due to intense pain. After receiving epidural birth went well.     Past Surgical History: Past Surgical History:  Procedure Laterality Date   NO PAST SURGERIES      Family History: Family History  Problem Relation Age of Onset   Hypertension Maternal Grandmother    Diabetes Maternal Grandmother    Stroke  Maternal Grandmother    Cancer Maternal Grandmother        uterine   Depression Maternal Grandmother    Diabetes Paternal Grandfather    Depression Mother    Mental illness Mother    Hypertension Mother    Cancer Mother     Social History: Social History   Tobacco Use   Smoking status: Never Smoker   Smokeless tobacco: Never Used  Substance Use Topics   Alcohol use: Yes   Drug use: No    Allergies:  Allergies  Allergen Reactions   Amoxicillin Swelling    Lips swell Has patient had a PCN reaction causing immediate rash, facial/tongue/throat swelling, SOB or lightheadedness with hypotension: yes Has patient had a PCN reaction causing severe rash involving mucus membranes or skin necrosis: no Has patient had a PCN reaction that required hospitalization: no Has patient had a PCN reaction occurring within the last 10 years: no If all of the above answers are "NO", then may proceed with Cephalosporin use. CAN TAKE KEFLEX     Meds:  Medications Prior to Admission  Medication Sig Dispense Refill Last Dose   acetaminophen (TYLENOL) 500 MG tablet Take 500  mg by mouth every 6 (six) hours as needed for mild pain or headache.    05/02/2020 at Unknown time   clindamycin (CLEOCIN) 300 MG capsule Take 1 capsule (300 mg total) by mouth 3 (three) times daily. 21 capsule 0    ibuprofen (ADVIL) 200 MG tablet Take 400 mg by mouth every 6 (six) hours as needed for moderate pain.      ondansetron (ZOFRAN ODT) 4 MG disintegrating tablet Take 1 tablet (4 mg total) by mouth every 8 (eight) hours as needed for nausea or vomiting. (Patient not taking: No sig reported) 20 tablet 0    traMADol (ULTRAM) 50 MG tablet Take 1 tablet (50 mg total) by mouth every 6 (six) hours as needed. 15 tablet 0     ROS:  Review of Systems  Eyes: Negative for visual disturbance.  Respiratory: Negative for shortness of breath.   Cardiovascular: Negative for chest pain.  Gastrointestinal: Positive for nausea and  vomiting. Negative for abdominal pain.  Genitourinary: Negative for urgency.  Musculoskeletal: Positive for back pain.  Neurological: Positive for dizziness and light-headedness. Negative for headaches.    I have reviewed patient's Past Medical Hx, Surgical Hx, Family Hx, Social Hx, medications and allergies.   Physical Exam   Patient Vitals for the past 24 hrs:  BP Temp Temp src Pulse Resp SpO2 Height Weight  05/02/20 1433 115/64 98.7 F (37.1 C) Oral 79 16 100 % -- --  05/02/20 1430 -- -- -- -- -- -- 5\' 2"  (1.575 m) 87.2 kg   Constitutional: Well-developed, well-nourished female in no acute distress.  Cardiovascular: normal rate Respiratory: normal effort GI: Abd soft, non-tender, gravid appropriate for gestational age.  MS: Extremities nontender, no edema, normal ROM. Tender to palpation over low thoracic spine and perispinal muscles.  Neurologic: Alert and oriented x 4.  GU: Neg CVAT.     FHT:  Unable to detect with doppler.   Labs: Results for orders placed or performed during the hospital encounter of 05/02/20 (from the past 24 hour(s))  Pregnancy, urine POC     Status: Abnormal   Collection Time: 05/02/20  2:17 PM  Result Value Ref Range   Preg Test, Ur POSITIVE (A) NEGATIVE  Urinalysis, Routine w reflex microscopic Urine, Clean Catch     Status: Abnormal   Collection Time: 05/02/20  3:39 PM  Result Value Ref Range   Color, Urine YELLOW YELLOW   APPearance HAZY (A) CLEAR   Specific Gravity, Urine 1.017 1.005 - 1.030   pH 7.0 5.0 - 8.0   Glucose, UA NEGATIVE NEGATIVE mg/dL   Hgb urine dipstick NEGATIVE NEGATIVE   Bilirubin Urine NEGATIVE NEGATIVE   Ketones, ur NEGATIVE NEGATIVE mg/dL   Protein, ur NEGATIVE NEGATIVE mg/dL   Nitrite NEGATIVE NEGATIVE   Leukocytes,Ua LARGE (A) NEGATIVE   RBC / HPF 0-5 0 - 5 RBC/hpf   WBC, UA 0-5 0 - 5 WBC/hpf   Bacteria, UA FEW (A) NONE SEEN   Squamous Epithelial / LPF 11-20 0 - 5   Mucus PRESENT   CBC     Status: None    Collection Time: 05/02/20  4:01 PM  Result Value Ref Range   WBC 8.6 4.0 - 10.5 K/uL   RBC 4.39 3.87 - 5.11 MIL/uL   Hemoglobin 13.4 12.0 - 15.0 g/dL   HCT 06/30/20 76.7 - 34.1 %   MCV 85.4 80.0 - 100.0 fL   MCH 30.5 26.0 - 34.0 pg   MCHC 35.7 30.0 - 36.0  g/dL   RDW 94.4 96.7 - 59.1 %   Platelets 260 150 - 400 K/uL   nRBC 0.0 0.0 - 0.2 %  Comprehensive metabolic panel     Status: Abnormal   Collection Time: 05/02/20  4:01 PM  Result Value Ref Range   Sodium 135 135 - 145 mmol/L   Potassium 3.7 3.5 - 5.1 mmol/L   Chloride 103 98 - 111 mmol/L   CO2 21 (L) 22 - 32 mmol/L   Glucose, Bld 88 70 - 99 mg/dL   BUN <5 (L) 6 - 20 mg/dL   Creatinine, Ser 6.38 0.44 - 1.00 mg/dL   Calcium 8.9 8.9 - 46.6 mg/dL   Total Protein 6.1 (L) 6.5 - 8.1 g/dL   Albumin 3.3 (L) 3.5 - 5.0 g/dL   AST 13 (L) 15 - 41 U/L   ALT 17 0 - 44 U/L   Alkaline Phosphatase 53 38 - 126 U/L   Total Bilirubin 0.7 0.3 - 1.2 mg/dL   GFR, Estimated >59 >93 mL/min   Anion gap 11 5 - 15  Lipase, blood     Status: None   Collection Time: 05/02/20  4:01 PM  Result Value Ref Range   Lipase 22 11 - 51 U/L  hCG, quantitative, pregnancy     Status: Abnormal   Collection Time: 05/02/20  4:01 PM  Result Value Ref Range   hCG, Beta Chain, Quant, S 108,891 (H) <5 mIU/mL      Imaging:  US OB Comp Less 14 Wks  Result Date: 05/02/2020 CLINICAL DATA:  Abdominal cramping in first trimester of pregnancy; LMP 02/11/2021 EXAM: TWIN OBSTETRICAL ULTRASOUND <14 WKS COMPARISON:  None. FINDINGS: Number of IUPs:  2 Chorionicity/Amnionicity: Monochorionic-diamniotic (thin membrane, faint, image 45) TWIN 1 Yolk sac:  Present Embryo:  Present Cardiac Activity: Present Heart Rate: 164 bpm CRL:   33.5 mm   10 w 1 d                  [place] EDC: 11/27/2020 TWIN 2 Yolk sac:  Present Embryo:  Present Cardiac Activity: Present Heart Rate: 162 bpm CRL:   28.9 mm   9 w 4 d                  [place] EDC: 12/01/2020 Subchorionic hemorrhage:  None visualized. Maternal  uterus/adnexae: Remainder of uterus unremarkable. Small corpus luteal cyst within LEFT ovary, ovaries otherwise unremarkable. No free pelvic fluid or adnexal masses. IMPRESSION: Live twin intrauterine gestation as above. Electronically Signed   By: Ulyses Southward M.D.   On: 05/02/2020 17:43   US OB Comp AddL Gest Less 14 Wks  Result Date: 05/02/2020 CLINICAL DATA:  Abdominal cramping in first trimester of pregnancy; LMP 02/11/2021 EXAM: TWIN OBSTETRICAL ULTRASOUND <14 WKS COMPARISON:  None. FINDINGS: Number of IUPs:  2 Chorionicity/Amnionicity: Monochorionic-diamniotic (thin membrane, faint, image 45) TWIN 1 Yolk sac:  Present Embryo:  Present Cardiac Activity: Present Heart Rate: 164 bpm CRL:   33.5 mm   10 w 1 d                  [place] EDC: 11/27/2020 TWIN 2 Yolk sac:  Present Embryo:  Present Cardiac Activity: Present Heart Rate: 162 bpm CRL:   28.9 mm   9 w 4 d                  [place] EDC: 12/01/2020 Subchorionic hemorrhage:  None visualized. Maternal uterus/adnexae: Remainder of uterus unremarkable. Small corpus luteal  cyst within LEFT ovary, ovaries otherwise unremarkable. No free pelvic fluid or adnexal masses. IMPRESSION: Live twin intrauterine gestation as above. Electronically Signed   By: Ulyses Southward M.D.   On: 05/02/2020 17:43    MAU Course/MDM:  Orders Placed This Encounter  Procedures   Culture, OB Urine   US OB Comp Less 14 Wks   US OB Comp AddL Gest Less 14 Wks   Urinalysis, Routine w reflex microscopic Urine, Clean Catch   CBC   Comprehensive metabolic panel   Lipase, blood   hCG, quantitative, pregnancy   Pregnancy, urine POC   Discharge patient    No orders of the defined types were placed in this encounter.   Reviewed labs and [place]. Positive pregnancy test.  UA, wet prep, CG/chlamydia, CBC, ABO/Rh, quant hCG, and [place] ordered to r/o ectopic pregnancy. US revealed mono/di twins.   Flexeril and Tylenol given for back pain in MAU. Zofran given for nausea. Patient reports that her symptoms  have mostly resolved from treatment. Ordered meds to take at home.   Assessment: 1. Back pain affecting pregnancy in first trimester   2. Abdominal cramping affecting pregnancy   3. Monochorionic diamniotic twin pregnancy in first trimester   4. Nausea and vomiting during pregnancy prior to [redacted] weeks gestation   5. [redacted] weeks gestation of pregnancy     Plan:  -Ordered Flexeril and Zofran for home. Patient educated on home treatment for back pain including Tylenol and heating pads. Instructed to take Flexeril as needed.  -Patient instructed to start prenatal care ASAP. -ER precautions given, stable to discharge home.    Allergies as of 05/02/2020       Reactions   Amoxicillin Swelling   Lips swell Has patient had a PCN reaction causing immediate rash, facial/tongue/throat swelling, SOB or lightheadedness with hypotension: yes Has patient had a PCN reaction causing severe rash involving mucus membranes or skin necrosis: no Has patient had a PCN reaction that required hospitalization: no Has patient had a PCN reaction occurring within the last 10 years: no If all of the above answers are "NO", then may proceed with Cephalosporin use. CAN TAKE KEFLEX        Medication List     STOP taking these medications    clindamycin 300 MG capsule Commonly known as: Cleocin   ibuprofen 200 MG tablet Commonly known as: ADVIL   traMADol 50 MG tablet Commonly known as: ULTRAM       TAKE these medications    acetaminophen 500 MG tablet Commonly known as: TYLENOL Take 500 mg by mouth every 6 (six) hours as needed for mild pain or headache.   cyclobenzaprine 5 MG tablet Commonly known as: FLEXERIL Take 1 tablet (5 mg total) by mouth 3 (three) times daily as needed for muscle spasms.   ondansetron 4 MG disintegrating tablet Commonly known as: Zofran ODT Take 1 tablet (4 mg total) by mouth every 8 (eight) hours as needed for nausea or vomiting.        Ileana Roup,  [place]      I confirm that I have verified and agree with the information documented in the PA student's note.   Please see my note for full documentation of this encounter.  Judeth Horn, NP 05/02/2020 7:24 PM

## 2020-05-02 NOTE — MAU Provider Note (Signed)
History     CSN: [id number]  Arrival date and time: 05/02/20 1405   Event Date/Time   First Provider Initiated Contact with Patient 05/02/20 1514      Chief Complaint  Patient presents with  . Back Pain  . Nausea   HPI Rachel Stuart is a 25 y.o. [id number] at [redacted]w[redacted]d by LMP who presents with back pain & nausea. Reports mid back pain for the last 3 weeks. Some lower abdominal cramping today. Rates pain 8/10. Has been taking tylenol with minimal relief. Also reports nausea & weakness since finding out she was pregnant. Has not taken anything for her symptoms. Has not vomited today.  Denies fever/chills, diarrhea, vaginal bleeding, or dysuria.   OB History    Gravida  4   Para  2   Term  2   Preterm      AB  1   Living  2     SAB  1   IAB      Ectopic      Multiple  0   Live Births  2        Obstetric Comments  Pt stated she lost consciousness during labor due to intense pain. After receiving epidural birth went well.         Past Medical History:  Diagnosis Date  . Ankle fracture   . Anxiety   . Depression    zoloft  . Obesity   . Ovarian cyst     Past Surgical History:  Procedure Laterality Date  . NO PAST SURGERIES      Family History  Problem Relation Age of Onset  . Hypertension Maternal Grandmother   . Diabetes Maternal Grandmother   . Stroke Maternal Grandmother   . Cancer Maternal Grandmother        uterine  . Depression Maternal Grandmother   . Diabetes Paternal Grandfather   . Depression Mother   . Mental illness Mother   . Hypertension Mother   . Cancer Mother     Social History   Tobacco Use  . Smoking status: Never Smoker  . Smokeless tobacco: Never Used  Substance Use Topics  . Alcohol use: Yes  . Drug use: No    Allergies:  Allergies  Allergen Reactions  . Amoxicillin Swelling    Lips swell Has patient had a PCN reaction causing immediate rash, facial/tongue/throat swelling, SOB or lightheadedness with hypotension:  yes Has patient had a PCN reaction causing severe rash involving mucus membranes or skin necrosis: no Has patient had a PCN reaction that required hospitalization: no Has patient had a PCN reaction occurring within the last 10 years: no If all of the above answers are "NO", then may proceed with Cephalosporin use. CAN TAKE KEFLEX     No medications prior to admission.    Review of Systems  Constitutional: Negative.   Gastrointestinal: Positive for abdominal pain and nausea. Negative for constipation, diarrhea and vomiting.  Genitourinary: Negative.   Musculoskeletal: Positive for back pain.  Neurological: Positive for light-headedness. Negative for headaches.   Physical Exam   Blood pressure 94/73, pulse 83, temperature 98.7 F (37.1 C), temperature source Oral, resp. rate 16, height 5\' 2"  (1.575 m), weight 87.2 kg, last menstrual period 02/12/2020, SpO2 100 %, unknown if currently breastfeeding.  Physical Exam Vitals and nursing note reviewed.  Constitutional:      General: She is not in acute distress.    Appearance: Normal appearance.  HENT:     Head: Normocephalic  and atraumatic.  Pulmonary:     Effort: Pulmonary effort is normal. No respiratory distress.  Abdominal:     General: Abdomen is flat. There is no distension.     Palpations: Abdomen is soft.     Tenderness: There is no right CVA tenderness or left CVA tenderness.  Skin:    General: Skin is warm and dry.  Neurological:     Mental Status: She is alert.     MAU Course  Procedures Results for orders placed or performed during the hospital encounter of 05/02/20 (from the past 24 hour(s))  Pregnancy, urine POC     Status: Abnormal   Collection Time: 05/02/20  2:17 PM  Result Value Ref Range   Preg Test, Ur POSITIVE (A) NEGATIVE  Urinalysis, Routine w reflex microscopic Urine, Clean Catch     Status: Abnormal   Collection Time: 05/02/20  3:39 PM  Result Value Ref Range   Color, Urine YELLOW YELLOW    APPearance HAZY (A) CLEAR   Specific Gravity, Urine 1.017 1.005 - 1.030   pH 7.0 5.0 - 8.0   Glucose, UA NEGATIVE NEGATIVE mg/dL   Hgb urine dipstick NEGATIVE NEGATIVE   Bilirubin Urine NEGATIVE NEGATIVE   Ketones, ur NEGATIVE NEGATIVE mg/dL   Protein, ur NEGATIVE NEGATIVE mg/dL   Nitrite NEGATIVE NEGATIVE   Leukocytes,Ua LARGE (A) NEGATIVE   RBC / HPF 0-5 0 - 5 RBC/hpf   WBC, UA 0-5 0 - 5 WBC/hpf   Bacteria, UA FEW (A) NONE SEEN   Squamous Epithelial / LPF 11-20 0 - 5   Mucus PRESENT   CBC     Status: None   Collection Time: 05/02/20  4:01 PM  Result Value Ref Range   WBC 8.6 4.0 - 10.5 K/uL   RBC 4.39 3.87 - 5.11 MIL/uL   Hemoglobin 13.4 12.0 - 15.0 g/dL   HCT 53.9 76.7 - 34.1 %   MCV 85.4 80.0 - 100.0 fL   MCH 30.5 26.0 - 34.0 pg   MCHC 35.7 30.0 - 36.0 g/dL   RDW 93.7 90.2 - 40.9 %   Platelets 260 150 - 400 K/uL   nRBC 0.0 0.0 - 0.2 %  Comprehensive metabolic panel     Status: Abnormal   Collection Time: 05/02/20  4:01 PM  Result Value Ref Range   Sodium 135 135 - 145 mmol/L   Potassium 3.7 3.5 - 5.1 mmol/L   Chloride 103 98 - 111 mmol/L   CO2 21 (L) 22 - 32 mmol/L   Glucose, Bld 88 70 - 99 mg/dL   BUN <5 (L) 6 - 20 mg/dL   Creatinine, Ser 7.35 0.44 - 1.00 mg/dL   Calcium 8.9 8.9 - 32.9 mg/dL   Total Protein 6.1 (L) 6.5 - 8.1 g/dL   Albumin 3.3 (L) 3.5 - 5.0 g/dL   AST 13 (L) 15 - 41 U/L   ALT 17 0 - 44 U/L   Alkaline Phosphatase 53 38 - 126 U/L   Total Bilirubin 0.7 0.3 - 1.2 mg/dL   GFR, Estimated >92 >42 mL/min   Anion gap 11 5 - 15  Lipase, blood     Status: None   Collection Time: 05/02/20  4:01 PM  Result Value Ref Range   Lipase 22 11 - 51 U/L  hCG, quantitative, pregnancy     Status: Abnormal   Collection Time: 05/02/20  4:01 PM  Result Value Ref Range   hCG, Beta Chain, Quant, S 108,891 (H) <5 mIU/mL  US OB Comp Less 14 Wks  Result Date: 05/02/2020 CLINICAL DATA:  Abdominal cramping in first trimester of pregnancy; LMP 02/11/2021 EXAM: TWIN  OBSTETRICAL ULTRASOUND <14 WKS COMPARISON:  None. FINDINGS: Number of IUPs:  2 Chorionicity/Amnionicity: Monochorionic-diamniotic (thin membrane, faint, image 45) TWIN 1 Yolk sac:  Present Embryo:  Present Cardiac Activity: Present Heart Rate: 164 bpm CRL:   33.5 mm   10 w 1 d                  [place] EDC: 11/27/2020 TWIN 2 Yolk sac:  Present Embryo:  Present Cardiac Activity: Present Heart Rate: 162 bpm CRL:   28.9 mm   9 w 4 d                  [place] EDC: 12/01/2020 Subchorionic hemorrhage:  None visualized. Maternal uterus/adnexae: Remainder of uterus unremarkable. Small corpus luteal cyst within LEFT ovary, ovaries otherwise unremarkable. No free pelvic fluid or adnexal masses. IMPRESSION: Live twin intrauterine gestation as above. Electronically Signed   By: Ulyses Southward M.D.   On: 05/02/2020 17:43   US OB Comp AddL Gest Less 14 Wks  Result Date: 05/02/2020 CLINICAL DATA:  Abdominal cramping in first trimester of pregnancy; LMP 02/11/2021 EXAM: TWIN OBSTETRICAL ULTRASOUND <14 WKS COMPARISON:  None. FINDINGS: Number of IUPs:  2 Chorionicity/Amnionicity: Monochorionic-diamniotic (thin membrane, faint, image 45) TWIN 1 Yolk sac:  Present Embryo:  Present Cardiac Activity: Present Heart Rate: 164 bpm CRL:   33.5 mm   10 w 1 d                  [place] EDC: 11/27/2020 TWIN 2 Yolk sac:  Present Embryo:  Present Cardiac Activity: Present Heart Rate: 162 bpm CRL:   28.9 mm   9 w 4 d                  [place] EDC: 12/01/2020 Subchorionic hemorrhage:  None visualized. Maternal uterus/adnexae: Remainder of uterus unremarkable. Small corpus luteal cyst within LEFT ovary, ovaries otherwise unremarkable. No free pelvic fluid or adnexal masses. IMPRESSION: Live twin intrauterine gestation as above. Electronically Signed   By: Ulyses Southward M.D.   On: 05/02/2020 17:43   MDM +UPT UA, wet prep, GC/chlamydia, CBC, ABO/Rh, quant hCG, and [place] today to rule out ectopic pregnancy which can be life threatening.   Ultrasound shows mo/di twin  pregnancy. EDD updated. Pt given list of providers.   Zofran, tylenol & flexeril given in MAU. Patient reports resolution of her nausea & back pain. Will rx meds for treatment at home.   Assessment and Plan   1. Back pain affecting pregnancy in first trimester   2. Abdominal cramping affecting pregnancy   3. Monochorionic diamniotic twin pregnancy in first trimester   4. Nausea and vomiting during pregnancy prior to [redacted] weeks gestation   5. [redacted] weeks gestation of pregnancy    -start prenatal care asap, given list of ob/gyn providers -rx zofran -rx flexeril....take tylenol & use heat for back, take flexeril prn -reviewed reasons to return to MAU  Judeth Horn 05/02/2020, 6:51 PM

## 2020-05-03 LAB — CULTURE, OB URINE
Culture: 70000 — AB
Special Requests: NORMAL

## 2020-05-14 ENCOUNTER — Inpatient Hospital Stay (HOSPITAL_COMMUNITY)
Admission: AD | Admit: 2020-05-14 | Discharge: 2020-05-14 | Disposition: A | Discharge status: Home / Self Care | Payer: Self-pay | Attending: Obstetrics and Gynecology | Admitting: Obstetrics and Gynecology

## 2020-05-14 ENCOUNTER — Other Ambulatory Visit: Payer: Self-pay

## 2020-05-14 ENCOUNTER — Encounter (HOSPITAL_COMMUNITY): Payer: Self-pay | Admitting: Obstetrics and Gynecology

## 2020-05-14 DIAGNOSIS — R109 Unspecified abdominal pain: Secondary | ICD-10-CM

## 2020-05-14 DIAGNOSIS — O99891 Other specified diseases and conditions complicating pregnancy: Secondary | ICD-10-CM

## 2020-05-14 DIAGNOSIS — K5901 Slow transit constipation: Secondary | ICD-10-CM | POA: Insufficient documentation

## 2020-05-14 DIAGNOSIS — O99611 Diseases of the digestive system complicating pregnancy, first trimester: Secondary | ICD-10-CM | POA: Insufficient documentation

## 2020-05-14 DIAGNOSIS — O98311 Other infections with a predominantly sexual mode of transmission complicating pregnancy, first trimester: Secondary | ICD-10-CM

## 2020-05-14 DIAGNOSIS — A599 Trichomoniasis, unspecified: Secondary | ICD-10-CM | POA: Insufficient documentation

## 2020-05-14 DIAGNOSIS — O30031 Twin pregnancy, monochorionic/diamniotic, first trimester: Secondary | ICD-10-CM | POA: Insufficient documentation

## 2020-05-14 DIAGNOSIS — O99340 Other mental disorders complicating pregnancy, unspecified trimester: Secondary | ICD-10-CM | POA: Insufficient documentation

## 2020-05-14 DIAGNOSIS — F419 Anxiety disorder, unspecified: Secondary | ICD-10-CM | POA: Insufficient documentation

## 2020-05-14 DIAGNOSIS — O98911 Unspecified maternal infectious and parasitic disease complicating pregnancy, first trimester: Secondary | ICD-10-CM | POA: Insufficient documentation

## 2020-05-14 DIAGNOSIS — R079 Chest pain, unspecified: Secondary | ICD-10-CM

## 2020-05-14 DIAGNOSIS — Z3A11 11 weeks gestation of pregnancy: Secondary | ICD-10-CM | POA: Insufficient documentation

## 2020-05-14 LAB — CBC
HCT: 36.7 % (ref 36.0–46.0)
Hemoglobin: 13.1 g/dL (ref 12.0–15.0)
MCH: 30.2 pg (ref 26.0–34.0)
MCHC: 35.7 g/dL (ref 30.0–36.0)
MCV: 84.6 fL (ref 80.0–100.0)
Platelets: 229 10*3/uL (ref 150–400)
RBC: 4.34 MIL/uL (ref 3.87–5.11)
RDW: 12.3 % (ref 11.5–15.5)
WBC: 7.2 10*3/uL (ref 4.0–10.5)
nRBC: 0 % (ref 0.0–0.2)

## 2020-05-14 LAB — URINALYSIS, ROUTINE W REFLEX MICROSCOPIC
Bilirubin Urine: NEGATIVE
Glucose, UA: NEGATIVE mg/dL
Hgb urine dipstick: NEGATIVE
Ketones, ur: 80 mg/dL — AB
Nitrite: NEGATIVE
Protein, ur: 30 mg/dL — AB
Specific Gravity, Urine: 1.025 (ref 1.005–1.030)
pH: 5 (ref 5.0–8.0)

## 2020-05-14 MED ORDER — METRONIDAZOLE 500 MG PO TABS
2000.0000 mg | ORAL_TABLET | Freq: Once | ORAL | Status: AC
  Administered 2020-05-14: 2000 mg via ORAL
  Filled 2020-05-14: qty 4

## 2020-05-14 NOTE — MAU Provider Note (Signed)
History     CSN: [id number]  Arrival date and time: 05/14/20 5885   Event Date/Time   First Provider Initiated Contact with Patient 05/14/20 [phone]      Chief Complaint  Patient presents with  . Abdominal Pain  . Chest Pain   24 y.o. [id number] @11 .6 wks with mo/di twins presenting with LAP and CP. Reports onset of sx last night. Pain is located in LLQ and radiates to umbilicus. Rates pain 7/10. Has not tried anything for it. Reports no BM in 4 days. Denies fever. Denies urinary sx. No VB or discharge. No new partner for 13 years but states "he's been acting weird lately, and leaving with my car a lot, was recently gone for 2 days". States that CP occurs when she hold her breath from the abdominal pain. Rates pain 5/10. Describes as sharp and intermittent. Denies SOB. Occasionally has heartburn. She reports using Zofran daily for morning sickness.   OB History    Gravida  4   Para  2   Term  2   Preterm      AB  1   Living  2     SAB  1   IAB      Ectopic      Multiple  0   Live Births  2        Obstetric Comments  Pt stated she lost consciousness during labor due to intense pain. After receiving epidural birth went well.         Past Medical History:  Diagnosis Date  . Ankle fracture   . Anxiety   . Depression    zoloft  . Obesity   . Ovarian cyst     Past Surgical History:  Procedure Laterality Date  . NO PAST SURGERIES      Family History  Problem Relation Age of Onset  . Hypertension Maternal Grandmother   . Diabetes Maternal Grandmother   . Stroke Maternal Grandmother   . Cancer Maternal Grandmother        uterine  . Depression Maternal Grandmother   . Diabetes Paternal Grandfather   . Depression Mother   . Mental illness Mother   . Hypertension Mother   . Cancer Mother   . Cancer Father     Social History   Tobacco Use  . Smoking status: Never Smoker  . Smokeless tobacco: Never Used  Vaping Use  . Vaping Use: Never used   Substance Use Topics  . Alcohol use: Yes  . Drug use: No    Allergies:  Allergies  Allergen Reactions  . Amoxicillin Swelling    Lips swell Has patient had a PCN reaction causing immediate rash, facial/tongue/throat swelling, SOB or lightheadedness with hypotension: yes Has patient had a PCN reaction causing severe rash involving mucus membranes or skin necrosis: no Has patient had a PCN reaction that required hospitalization: no Has patient had a PCN reaction occurring within the last 10 years: no If all of the above answers are "NO", then may proceed with Cephalosporin use. CAN TAKE KEFLEX     No medications prior to admission.    Review of Systems  Constitutional: Negative for chills and fever.  Respiratory: Negative for shortness of breath.   Cardiovascular: Positive for chest pain.  Gastrointestinal: Positive for abdominal pain, constipation and nausea.   Physical Exam   Blood pressure 128/70, pulse 78, temperature 98.3 F (36.8 C), temperature source Oral, resp. rate 20, height 5\' 3"  (1.6 m), weight 85.5  kg, last menstrual period 02/12/2020, SpO2 100 %, unknown if currently breastfeeding.  Physical Exam Vitals and nursing note reviewed.  Constitutional:      General: She is not in acute distress.    Appearance: Normal appearance.  HENT:     Head: Normocephalic and atraumatic.  Cardiovascular:     Rate and Rhythm: Normal rate and regular rhythm.     Heart sounds: Normal heart sounds.  Pulmonary:     Effort: Pulmonary effort is normal. No respiratory distress.     Breath sounds: Normal breath sounds. No stridor. No wheezing, rhonchi or rales.  Abdominal:     General: Bowel sounds are normal. There is no distension.     Palpations: Abdomen is soft. There is no mass.     Tenderness: There is no abdominal tenderness. There is no guarding or rebound.     Hernia: No hernia is present.  Musculoskeletal:        General: Normal range of motion.     Cervical back:  Normal range of motion.  Skin:    General: Skin is warm and dry.  Neurological:     General: No focal deficit present.     Mental Status: She is alert and oriented to person, place, and time.  Psychiatric:        Mood and Affect: Mood normal.        Behavior: Behavior normal.   BSUS: FHR-A 171 bpm, FHR-B 164 bpm, subjectively normal AFV, active fetus x2  Results for orders placed or performed during the hospital encounter of 05/14/20 (from the past 24 hour(s))  Urinalysis, Routine w reflex microscopic Urine, Clean Catch     Status: Abnormal   Collection Time: 05/14/20  7:46 AM  Result Value Ref Range   Color, Urine AMBER (A) YELLOW   APPearance HAZY (A) CLEAR   Specific Gravity, Urine 1.025 1.005 - 1.030   pH 5.0 5.0 - 8.0   Glucose, UA NEGATIVE NEGATIVE mg/dL   Hgb urine dipstick NEGATIVE NEGATIVE   Bilirubin Urine NEGATIVE NEGATIVE   Ketones, ur 80 (A) NEGATIVE mg/dL   Protein, ur 30 (A) NEGATIVE mg/dL   Nitrite NEGATIVE NEGATIVE   Leukocytes,Ua LARGE (A) NEGATIVE   RBC / HPF 11-20 0 - 5 RBC/hpf   WBC, UA 21-50 0 - 5 WBC/hpf   Bacteria, UA FEW (A) NONE SEEN   Squamous Epithelial / LPF 11-20 0 - 5   Mucus PRESENT    Trichomonas, UA PRESENT (A) NONE SEEN  CBC     Status: None   Collection Time: 05/14/20  8:59 AM  Result Value Ref Range   WBC 7.2 4.0 - 10.5 K/uL   RBC 4.34 3.87 - 5.11 MIL/uL   Hemoglobin 13.1 12.0 - 15.0 g/dL   HCT 63.8 45.3 - 64.6 %   MCV 84.6 80.0 - 100.0 fL   MCH 30.2 26.0 - 34.0 pg   MCHC 35.7 30.0 - 36.0 g/dL   RDW 80.3 21.2 - 24.8 %   Platelets 229 150 - 400 K/uL   nRBC 0.0 0.0 - 0.2 %   MAU Course  Procedures Flagyl 2g  MDM Labs and EKG ordered. EKG ready by Dr. Crissie Reese no acute concerns. Pain likely constipation discussed home remedies including Miralax. No signs of SAB or acute abdominal process. Informed of +trich and given Flagyl, partner needs treatment and abstain until TOC. Stable for discharge home.   Assessment and Plan   1. [redacted]  weeks gestation of pregnancy  2. Monochorionic diamniotic twin gestation in first trimester   3. Slow transit constipation   4. Trichomonal infection    Discharge home Follow up with OB provider of choice to start care- list provided Pregnancy verification letter provided SAB precautions Miralax bid until having BMs then daily while using Zofran  Allergies as of 05/14/2020      Reactions   Amoxicillin Swelling   Lips swell Has patient had a PCN reaction causing immediate rash, facial/tongue/throat swelling, SOB or lightheadedness with hypotension: yes Has patient had a PCN reaction causing severe rash involving mucus membranes or skin necrosis: no Has patient had a PCN reaction that required hospitalization: no Has patient had a PCN reaction occurring within the last 10 years: no If all of the above answers are "NO", then may proceed with Cephalosporin use. CAN TAKE KEFLEX      Medication List    TAKE these medications   acetaminophen 500 MG tablet Commonly known as: TYLENOL Take 500 mg by mouth every 6 (six) hours as needed for mild pain or headache.   cyclobenzaprine 5 MG tablet Commonly known as: FLEXERIL Take 1 tablet (5 mg total) by mouth 3 (three) times daily as needed for muscle spasms.   ondansetron 4 MG disintegrating tablet Commonly known as: Zofran ODT Take 1 tablet (4 mg total) by mouth every 8 (eight) hours as needed for nausea or vomiting.       Donette Larry, CNM 05/14/2020, 11:05 AM

## 2020-05-14 NOTE — Discharge Instructions (Signed)
Constipation, Adult Constipation is when a person has trouble pooping (having a bowel movement). When you have this condition, you may poop fewer than 3 times a week. Your poop (stool) may also be dry, hard, or bigger than normal. Follow these instructions at home: Eating and drinking  Eat foods that have a lot of fiber, such as: ? Fresh fruits and vegetables. ? Whole grains. ? Beans.  Eat less of foods that are low in fiber and high in fat and sugar, such as: ? [place] fries. ? Hamburgers. ? Cookies. ? Candy. ? Soda.  Drink enough fluid to keep your pee (urine) pale yellow.   General instructions  Exercise regularly or as told by your doctor. Try to do 150 minutes of exercise each week.  Go to the restroom when you feel like you need to poop. Do not hold it in.  Take over-the-counter and prescription medicines only as told by your doctor. These include any fiber supplements.  When you poop: ? Do deep breathing while relaxing your lower belly (abdomen). ? Relax your pelvic floor. The pelvic floor is a group of muscles that support the rectum, bladder, and intestines (as well as the uterus in women).  Watch your condition for any changes. Tell your doctor if you notice any.  Keep all follow-up visits as told by your doctor. This is important. Contact a doctor if:  You have pain that gets worse.  You have a fever.  You have not pooped for 4 days.  You vomit.  You are not hungry.  You lose weight.  You are bleeding from the opening of the butt (anus).  You have thin, pencil-like poop. Get help right away if:  You have a fever, and your symptoms suddenly get worse.  You leak poop or have blood in your poop.  Your belly feels hard or bigger than normal (bloated).  You have very bad belly pain.  You feel dizzy or you faint. Summary  Constipation is when a person poops fewer than 3 times a week, has trouble pooping, or has poop that is dry, hard, or bigger than  normal.  Eat foods that have a lot of fiber.  Drink enough fluid to keep your pee (urine) pale yellow.  Take over-the-counter and prescription medicines only as told by your doctor. These include any fiber supplements. This information is not intended to replace advice given to you by your health care provider. Make sure you discuss any questions you have with your health care provider. Document Revised: 02/02/2019 Document Reviewed: 02/02/2019 Elsevier Patient Education  2021 Elsevier Inc.  [place] [facility] Providers    [facility] [employer] [facility]      Phone: [phone]  [facility] [employer] [facility] [place]    Phone: [phone]  [facility] [employer] [facility] [place]   Phone: [phone]  [facility] [employer] [facility] [employer]   Phone: [phone]  [facility] [employer] [facility] [place]   Phone: [phone]  [facility]    Phone: [phone]  [facility]        Phone: [phone]  [facility]     Phone: [phone]   [facility]     Phone: [phone]  [facility]     Phone: [phone]  [facility]     Phone: [phone]  [facility] Department-Family Planning        Phone:  [phone]   [facility]   Phone: [phone]  [facility]     Phone: [phone]  [facility]    Phone: [phone]  [facility]       Phone: [phone]  [facility]     Phone: [phone]    Trichomoniasis Trichomoniasis is an STI (sexually transmitted infection) that can affect both women and men. In women, the outer area of the female genitalia (vulva) and the vagina are affected. In men, mainly the penis is affected, but the prostate and other reproductive organs can also  be involved.  This condition can be treated with medicine. It often has no symptoms (is asymptomatic), especially in men. If not treated, trichomoniasis can last for months or years. What are the causes? This condition is caused by a parasite called Trichomonas vaginalis. Trichomoniasis most often spreads from person to person (is contagious) through sexual contact. What increases the risk? The following factors may make you more likely to develop this condition:  Having unprotected sex.  Having sex with a partner who has trichomoniasis.  Having multiple sexual partners.  Having had previous trichomoniasis infections or other STIs. What are the signs or symptoms? In women, symptoms of trichomoniasis include:  Abnormal vaginal discharge that is clear, white, gray, or yellow-green and foamy and has an unusual "fishy" odor.  Itching and irritation of the vagina and vulva.  Burning or pain during urination or sex.  Redness and swelling of the genitals. In men, symptoms of trichomoniasis include:  Penile discharge that may be foamy or contain pus.  Pain in the penis. This may happen only when urinating.  Itching or irritation inside the penis.  Burning after urination or ejaculation. How is this diagnosed? In women, this condition may be found during a routine Pap test or physical exam. It may be found in men during a routine physical exam. Your health care provider may do tests to help diagnose this infection, such as:  Urine tests (men and women).  The following in women: ? Testing the pH of the vagina. ? A vaginal swab test that checks for the Trichomonas vaginalis parasite. ? Testing vaginal secretions. Your health care provider may test you for other STIs, including HIV (human immunodeficiency virus). How is this treated? This condition is treated with medicine taken by mouth (orally), such as metronidazole or tinidazole, to fight the infection. Your sexual partner(s) also  need to be tested and treated.  If you are a woman and you plan to become pregnant or think you may be pregnant, tell your health care provider right away. Some medicines that are used to treat the infection should not be taken during pregnancy. Your health care provider may recommend over-the-counter medicines or creams to help relieve itching or irritation. You may be tested for infection again 3 months after treatment.   Follow these instructions at home:  Take and use over-the-counter and prescription medicines, including creams, only as told by your health care provider.  Take your antibiotic medicine as told by your health care provider. Do not stop taking the antibiotic even if you start to feel better.  Do not have sex until 7-10 days after you finish your medicine, or until your health care provider approves. Ask your health care provider when you may start to have sex again.  (Women) Do not douche or wear tampons while you have the infection.  Discuss your infection with your sexual partner(s). Make  sure that your partner gets tested and treated, if necessary.  Keep all follow-up visits as told by your health care provider. This is important. How is this prevented?  Use condoms every time you have sex. Using condoms correctly and consistently can help protect against STIs.  Avoid having multiple sexual partners.  Talk with your sexual partner about any symptoms that either of you may have, as well as any history of STIs.  Get tested for STIs and STDs (sexually transmitted diseases) before you have sex. Ask your partner to do the same.  Do not have sexual contact if you have symptoms of trichomoniasis or another STI.   Contact a health care provider if:  You still have symptoms after you finish your medicine.  You develop pain in your abdomen.  You have pain when you urinate.  You have bleeding after sex.  You develop a rash.  You feel nauseous or you vomit.  You  plan to become pregnant or think you may be pregnant. Summary  Trichomoniasis is an STI (sexually transmitted infection) that can affect both women and men.  This condition often has no symptoms (is asymptomatic), especially in men.  Without treatment, this condition can last for months or years.  You should not have sex until 7-10 days after you finish your medicine, or until your health care provider approves. Ask your health care provider when you may start to have sex again.  Discuss your infection with your sexual partner(s). Make sure that your partner gets tested and treated, if necessary. This information is not intended to replace advice given to you by your health care provider. Make sure you discuss any questions you have with your health care provider. Document Revised: 12/29/2017 Document Reviewed: 12/29/2017 Elsevier Patient Education  2021 Elsevier Inc.                   Safe Medications in Pregnancy    Acne: Benzoyl Peroxide Salicylic Acid  Backache/Headache: Tylenol: 2 regular strength every 4 hours OR              2 Extra strength every 6 hours  Colds/Coughs/Allergies: Benadryl (alcohol free) 25 mg every 6 hours as needed Breath right strips Claritin Cepacol throat lozenges Chloraseptic throat spray Cold-Eeze- up to three times per day Cough drops, alcohol free Flonase (by prescription only) Guaifenesin Mucinex Robitussin DM (plain only, alcohol free) Saline nasal spray/drops Sudafed (pseudoephedrine) & Actifed ** use only after [redacted] weeks gestation and if you do not have high blood pressure Tylenol Vicks Vaporub Zinc lozenges Zyrtec   Constipation: Colace Ducolax suppositories Fleet enema Glycerin suppositories Metamucil Milk of magnesia Miralax Senokot Smooth move tea  Diarrhea: Kaopectate Imodium A-D  *NO pepto Bismol  Hemorrhoids: Anusol Anusol HC Preparation H Tucks  Indigestion: Tums Maalox Mylanta Zantac   Pepcid  Insomnia: Benadryl (alcohol free) 25mg  every 6 hours as needed Tylenol PM Unisom, no Gelcaps  Leg Cramps: Tums MagGel  Nausea/Vomiting:  Bonine Dramamine Emetrol Ginger extract Sea bands Meclizine  Nausea medication to take during pregnancy:  Unisom (doxylamine succinate 25 mg tablets) Take one tablet daily at bedtime. If symptoms are not adequately controlled, the dose can be increased to a maximum recommended dose of two tablets daily (1/2 tablet in the morning, 1/2 tablet mid-afternoon and one at bedtime). Vitamin B6 100mg  tablets. Take one tablet twice a day (up to 200 mg per day).  Skin Rashes: Aveeno products Benadryl cream or 25mg  every 6 hours as needed  Calamine Lotion 1% cortisone cream  Yeast infection: Gyne-lotrimin 7 Monistat 7   **If taking multiple medications, please check labels to avoid duplicating the same active ingredients **take medication as directed on the label ** Do not exceed 4000 mg of tylenol in 24 hours **Do not take medications that contain aspirin or ibuprofen

## 2020-05-14 NOTE — MAU Note (Signed)
Presents with c/o lower abdominal pain around belly button on left side, reports took Tylenol last night @ approximately 2200, pain remains.  Also chest pain, located midsternum.   Denies VB.

## 2020-05-15 LAB — CULTURE, OB URINE

## 2020-09-01 ENCOUNTER — Inpatient Hospital Stay (HOSPITAL_COMMUNITY)
Admission: AD | Admit: 2020-09-01 | Discharge: 2020-09-02 | Disposition: A | Discharge status: Home / Self Care | Payer: Medicaid Other | Attending: Obstetrics and Gynecology | Admitting: Obstetrics and Gynecology

## 2020-09-01 ENCOUNTER — Other Ambulatory Visit: Payer: Self-pay

## 2020-09-01 DIAGNOSIS — Z3A28 28 weeks gestation of pregnancy: Secondary | ICD-10-CM | POA: Insufficient documentation

## 2020-09-01 DIAGNOSIS — O26893 Other specified pregnancy related conditions, third trimester: Secondary | ICD-10-CM | POA: Insufficient documentation

## 2020-09-01 DIAGNOSIS — R109 Unspecified abdominal pain: Secondary | ICD-10-CM | POA: Insufficient documentation

## 2020-09-01 DIAGNOSIS — O30033 Twin pregnancy, monochorionic/diamniotic, third trimester: Secondary | ICD-10-CM | POA: Insufficient documentation

## 2020-09-01 DIAGNOSIS — Z881 Allergy status to other antibiotic agents status: Secondary | ICD-10-CM | POA: Insufficient documentation

## 2020-09-01 DIAGNOSIS — Z880 Allergy status to penicillin: Secondary | ICD-10-CM | POA: Insufficient documentation

## 2020-09-01 DIAGNOSIS — W108XXA Fall (on) (from) other stairs and steps, initial encounter: Secondary | ICD-10-CM | POA: Insufficient documentation

## 2020-09-02 ENCOUNTER — Encounter (HOSPITAL_COMMUNITY): Payer: Self-pay | Admitting: Obstetrics and Gynecology

## 2020-09-02 DIAGNOSIS — Z3A28 28 weeks gestation of pregnancy: Secondary | ICD-10-CM | POA: Diagnosis not present

## 2020-09-02 DIAGNOSIS — Z880 Allergy status to penicillin: Secondary | ICD-10-CM | POA: Diagnosis not present

## 2020-09-02 DIAGNOSIS — Z881 Allergy status to other antibiotic agents status: Secondary | ICD-10-CM | POA: Diagnosis not present

## 2020-09-02 DIAGNOSIS — O26893 Other specified pregnancy related conditions, third trimester: Secondary | ICD-10-CM | POA: Diagnosis present

## 2020-09-02 DIAGNOSIS — O9A213 Injury, poisoning and certain other consequences of external causes complicating pregnancy, third trimester: Secondary | ICD-10-CM | POA: Diagnosis not present

## 2020-09-02 DIAGNOSIS — O30033 Twin pregnancy, monochorionic/diamniotic, third trimester: Secondary | ICD-10-CM

## 2020-09-02 DIAGNOSIS — W108XXA Fall (on) (from) other stairs and steps, initial encounter: Secondary | ICD-10-CM

## 2020-09-02 DIAGNOSIS — R109 Unspecified abdominal pain: Secondary | ICD-10-CM | POA: Diagnosis not present

## 2020-09-02 LAB — URINALYSIS, ROUTINE W REFLEX MICROSCOPIC
Bilirubin Urine: NEGATIVE
Glucose, UA: NEGATIVE mg/dL
Hgb urine dipstick: NEGATIVE
Ketones, ur: NEGATIVE mg/dL
Leukocytes,Ua: NEGATIVE
Nitrite: NEGATIVE
Protein, ur: NEGATIVE mg/dL
Specific Gravity, Urine: 1.019 (ref 1.005–1.030)
pH: 6 (ref 5.0–8.0)

## 2020-09-02 MED ORDER — CYCLOBENZAPRINE HCL 10 MG PO TABS: 10.0000 mg | ORAL_TABLET | Freq: Two times a day (BID) | ORAL | 0 refills | Status: DC | PRN

## 2020-09-02 MED ORDER — CYCLOBENZAPRINE HCL 5 MG PO TABS
10.0000 mg | ORAL_TABLET | Freq: Once | ORAL | Status: AC
  Administered 2020-09-02: 10 mg via ORAL
  Filled 2020-09-02: qty 2

## 2020-09-02 NOTE — MAU Provider Note (Signed)
History     CSN: [id number]  Arrival date and time: 09/01/20 2336   Event Date/Time   First Provider Initiated Contact with Patient 09/02/20 [phone]      Chief Complaint  Patient presents with  . Fall  . Abdominal Pain  . Back Pain   Rachel Stuart is a 25 y.o. [phone] with mono/di twins at [redacted]w[redacted]d who presents today with back pain after a fall. She states that on 08/30/2020 at 1600 she slipped going down some stairs. She landed on her buttocks and slid down. She denies hitting her abdomen, and at the time she did not have any pain other than her arm. She had a bruise on her right arm almost immedialty after the fall. Then she noticed a bruise on her right leg as well. Then 09/01/2020 she started to feel pain in her abdomen and back. She denies any contractions, VB or  LOF. She reports normal fetal movement. She has not been able to start prenatal care at this time.    OB History    Gravida  4   Para  2   Term  2   Preterm      AB  1   Living  2     SAB  1   IAB      Ectopic      Multiple  0   Live Births  2        Obstetric Comments  Pt stated she lost consciousness during labor due to intense pain. After receiving epidural birth went well.         Past Medical History:  Diagnosis Date  . Ankle fracture   . Anxiety   . Depression    zoloft  . Obesity   . Ovarian cyst     Past Surgical History:  Procedure Laterality Date  . NO PAST SURGERIES      Family History  Problem Relation Age of Onset  . Hypertension Maternal Grandmother   . Diabetes Maternal Grandmother   . Stroke Maternal Grandmother   . Cancer Maternal Grandmother        uterine  . Depression Maternal Grandmother   . Diabetes Paternal Grandfather   . Depression Mother   . Mental illness Mother   . Hypertension Mother   . Cancer Mother   . Cancer Father     Social History   Tobacco Use  . Smoking status: Never Smoker  . Smokeless tobacco: Never Used  Vaping Use  . Vaping Use:  Never used  Substance Use Topics  . Alcohol use: Yes  . Drug use: No    Allergies:  Allergies  Allergen Reactions  . Amoxicillin Swelling    Lips swell Has patient had a PCN reaction causing immediate rash, facial/tongue/throat swelling, SOB or lightheadedness with hypotension: yes Has patient had a PCN reaction causing severe rash involving mucus membranes or skin necrosis: no Has patient had a PCN reaction that required hospitalization: no Has patient had a PCN reaction occurring within the last 10 years: no If all of the above answers are "NO", then may proceed with Cephalosporin use. CAN TAKE KEFLEX     Medications Prior to Admission  Medication Sig Dispense Refill Last Dose  . acetaminophen (TYLENOL) 500 MG tablet Take 500 mg by mouth every 6 (six) hours as needed for mild pain or headache.    Unknown at Unknown time  . cyclobenzaprine (FLEXERIL) 5 MG tablet Take 1 tablet (5 mg total) by mouth  3 (three) times daily as needed for muscle spasms. 20 tablet 0 Unknown at Unknown time  . ondansetron (ZOFRAN ODT) 4 MG disintegrating tablet Take 1 tablet (4 mg total) by mouth every 8 (eight) hours as needed for nausea or vomiting. 15 tablet 0 Unknown at Unknown time    Review of Systems Physical Exam   Blood pressure (!) 109/54, pulse 65, temperature 98.3 F (36.8 C), temperature source Oral, resp. rate 18, height 5\' 3"  (1.6 m), weight 85.4 kg, last menstrual period 02/12/2020, SpO2 100 %, unknown if currently breastfeeding.  Physical Exam Vitals and nursing note reviewed. Exam conducted with a chaperone present.  Constitutional:      General: She is not in acute distress. Eyes:     Pupils: Pupils are equal, round, and reactive to light.  Cardiovascular:     Rate and Rhythm: Normal rate.  Pulmonary:     Effort: Pulmonary effort is normal.  Abdominal:     Palpations: Abdomen is soft.     Tenderness: There is no abdominal tenderness.  Musculoskeletal:     Comments: Bruises  noted on right arm, right thigh and right calf   Skin:    General: Skin is dry.  Neurological:     Mental Status: She is alert and oriented to person, place, and time.  Psychiatric:        Mood and Affect: Mood normal.        Behavior: Behavior normal.      NST:  Baseline: 145 Variability: moderate Accels: 15x15 Decels: none Toco: none Reactive/Appropriate for GA   NST:  Baseline: 135 Variability: moderate Accels: 15x15 Decels: none Toco: none Reactive/Appropriate for GA   MAU Course  Procedures  MDM Patient has had flexeril for pain. She reports that she is feeling better. Patient is >48 hours since fall. NST is reactive at this time.  Message sent to [facility] for an appointment ASAP.  Assessment and Plan   1. Fall (on) (from) other stairs and steps, initial encounter   2. [redacted] weeks gestation of pregnancy   3. Monochorionic diamniotic twin gestation in third trimester    DC home Comfort measures reviewed  3rd Trimester precautions  Bleeding precautions PTL precautions  Fetal kick counts RX: flexeril PRN #20  Return to MAU as needed FU with OB as planned   Follow-up Information    [facility] Follow up.   Specialty: Obstetrics and Gynecology Contact information: [address] [place] [phone]             [phone] DNP, CNM  09/02/20  1:46 AM

## 2020-09-02 NOTE — MAU Note (Signed)
Pt presents to MAU with a c/o abdominal pain and back pain from a fall that pt states happened two days ago.  Pt observed with a bruise on her right forearm and lower right leg.  Pt reports that she did not come in for evaluation after the fall and because she started to have the lower abdominal pain and back pain she came in tonight.

## 2020-09-02 NOTE — Discharge Instructions (Signed)

## 2020-09-26 ENCOUNTER — Ambulatory Visit: Payer: Medicaid Other | Attending: Family Medicine

## 2020-09-26 ENCOUNTER — Ambulatory Visit: Payer: Medicaid Other | Admitting: *Deleted

## 2020-09-26 ENCOUNTER — Encounter: Payer: Self-pay | Admitting: Family Medicine

## 2020-09-26 ENCOUNTER — Ambulatory Visit (INDEPENDENT_AMBULATORY_CARE_PROVIDER_SITE_OTHER): Payer: Medicaid Other | Admitting: Family Medicine

## 2020-09-26 ENCOUNTER — Other Ambulatory Visit: Payer: Self-pay | Admitting: Family Medicine

## 2020-09-26 ENCOUNTER — Other Ambulatory Visit: Payer: Self-pay

## 2020-09-26 ENCOUNTER — Encounter: Payer: Self-pay | Admitting: *Deleted

## 2020-09-26 ENCOUNTER — Other Ambulatory Visit (HOSPITAL_COMMUNITY)
Admission: RE | Admit: 2020-09-26 | Discharge: 2020-09-26 | Disposition: A | Discharge status: Home / Self Care | Payer: Medicaid Other | Source: Ambulatory Visit | Attending: Family Medicine | Admitting: Family Medicine

## 2020-09-26 ENCOUNTER — Ambulatory Visit: Payer: Medicaid Other | Attending: Obstetrics | Admitting: Obstetrics

## 2020-09-26 VITALS — BP 127/66 | HR 65

## 2020-09-26 VITALS — BP 110/63 | HR 72 | Wt 175.7 lb

## 2020-09-26 DIAGNOSIS — Z3A31 31 weeks gestation of pregnancy: Secondary | ICD-10-CM

## 2020-09-26 DIAGNOSIS — O0933 Supervision of pregnancy with insufficient antenatal care, third trimester: Secondary | ICD-10-CM

## 2020-09-26 DIAGNOSIS — O99343 Other mental disorders complicating pregnancy, third trimester: Secondary | ICD-10-CM

## 2020-09-26 DIAGNOSIS — O364XX1 Maternal care for intrauterine death, fetus 1: Secondary | ICD-10-CM | POA: Diagnosis not present

## 2020-09-26 DIAGNOSIS — O0990 Supervision of high risk pregnancy, unspecified, unspecified trimester: Secondary | ICD-10-CM | POA: Insufficient documentation

## 2020-09-26 DIAGNOSIS — O30033 Twin pregnancy, monochorionic/diamniotic, third trimester: Secondary | ICD-10-CM

## 2020-09-26 DIAGNOSIS — O364XX0 Maternal care for intrauterine death, not applicable or unspecified: Secondary | ICD-10-CM | POA: Insufficient documentation

## 2020-09-26 DIAGNOSIS — O364XX2 Maternal care for intrauterine death, fetus 2: Secondary | ICD-10-CM | POA: Insufficient documentation

## 2020-09-26 DIAGNOSIS — O0930 Supervision of pregnancy with insufficient antenatal care, unspecified trimester: Secondary | ICD-10-CM

## 2020-09-26 DIAGNOSIS — F32A Depression, unspecified: Secondary | ICD-10-CM

## 2020-09-26 DIAGNOSIS — O0993 Supervision of high risk pregnancy, unspecified, third trimester: Secondary | ICD-10-CM | POA: Diagnosis present

## 2020-09-26 NOTE — Progress Notes (Signed)
Subjective:  Rachel Stuart is a [id number] [redacted]w[redacted]d by 10 week [place]. She is being seen today for her first obstetrical visit.  Her obstetrical history is significant for  late to prenatal care, history of 2 prior SVD . FOB involved. Patient does intend to breast feed. Pregnancy history fully reviewed.  Patient reports no complaints.  BP 110/63   Pulse 72   Wt 175 lb 11.2 oz (79.7 kg)   LMP 02/12/2020   BMI 31.12 kg/m   HISTORY: OB History  Gravida Para Term Preterm AB Living  4 2 2  0 1 2  SAB IAB Ectopic Multiple Live Births  1 0 0 1 2    # Outcome Date GA Lbr Len/2nd Weight Sex Delivery Anes PTL Lv  4 Current           3 Term 02/15/15 [redacted]w[redacted]d 08:59 / 00:46 7 lb 11.3 oz (3.495 kg) F Vag-Spont EPI  LIV  2 SAB 2015          1 Term 02/26/10   7 lb 11 oz (3.487 kg) F Vag-Spont EPI  LIV    Obstetric Comments  Pt stated she lost consciousness during labor due to intense pain. After receiving epidural birth went well.     Past Medical History:  Diagnosis Date   Ankle fracture    Anxiety    Depression    zoloft   Obesity    Ovarian cyst     Past Surgical History:  Procedure Laterality Date   NO PAST SURGERIES      Family History  Problem Relation Age of Onset   Hypertension Maternal Grandmother    Diabetes Maternal Grandmother    Stroke Maternal Grandmother    Cancer Maternal Grandmother        uterine   Depression Maternal Grandmother    Diabetes Paternal Grandfather    Depression Mother    Mental illness Mother    Hypertension Mother    Cancer Mother    Cancer Father      Exam  BP 110/63   Pulse 72   Wt 175 lb 11.2 oz (79.7 kg)   LMP 02/12/2020   BMI 31.12 kg/m   Chaperone present during exam  CONSTITUTIONAL: Well-developed, well-nourished female in no acute distress.  HENT:  Normocephalic, atraumatic, External right and left ear normal. Oropharynx is clear and moist EYES: Conjunctivae and EOM are normal. Pupils are equal, round, and reactive to light. No  scleral icterus.  NECK: Normal range of motion, supple, no masses.  Normal thyroid.  CARDIOVASCULAR: Normal heart rate noted, regular rhythm RESPIRATORY: Clear to auscultation bilaterally. Effort and breath sounds normal, no problems with respiration noted. BREASTS: Symmetric in size. No masses, skin changes, nipple drainage, or lymphadenopathy. ABDOMEN: Soft, normal bowel sounds, no distention noted.  No tenderness, rebound or guarding.  PELVIC: Normal appearing external genitalia; normal appearing vaginal mucosa and cervix. No abnormal discharge noted. Normal uterine size, no other palpable masses, no uterine or adnexal tenderness. MUSCULOSKELETAL: Normal range of motion. No tenderness.  No cyanosis, clubbing, or edema.  2+ distal pulses. SKIN: Skin is warm and dry. No rash noted. Not diaphoretic. No erythema. No pallor. NEUROLOGIC: Alert and oriented to person, place, and time. Normal reflexes, muscle tone coordination. No cranial nerve deficit noted. PSYCHIATRIC: Normal mood and affect. Normal behavior. Normal judgment and thought content.    Assessment:    Pregnancy: 02/14/2020 Patient Active Problem List   Diagnosis Date Noted   Supervision of high risk pregnancy,  antepartum 09/26/2020   Monochorionic diamniotic twin gestation in third trimester 09/26/2020   Fetal demise, greater than 22 weeks, antepartum 09/26/2020   Late prenatal care 09/26/2020   NSVD (normal spontaneous vaginal delivery) 02/16/2015   History of postpartum depression, currently pregnant 02/16/2015   Active labor at term 02/14/2015   Obesity during pregnancy in second trimester       Plan:   1. [redacted] weeks gestation of pregnancy  2. Supervision of high risk pregnancy, antepartum FHT of only one twin found. Bedside US done, confirming only heart heart rate. Will get limited US with MFM to confirm. Will need [place] in next week or two with MFM consult. Will  - [place] MFM OB LIMITED; Future  3. Monochorionic diamniotic  twin gestation in third trimester  4. Fetal demise, greater than 22 weeks, antepartum, fetus 1 of multiple gestation  5. Late prenatal care   Problem list reviewed and updated. 75% of 30 min visit spent on counseling and coordination of care.     Levie Heritage 09/26/2020

## 2020-09-26 NOTE — Progress Notes (Signed)
MFM Note  This patient was seen due to a spontaneously conceived monochorionic, diamniotic twin gestation where a demise of twin B was noted earlier today.  Due to insurance issues, the patient has not had any prenatal care in her current pregnancy until today.  She did have two visits to the MAU for various complaints earlier in her pregnancy.  She had a first trimester ultrasound performed in the MAU which confirmed a monochorionic, diamniotic twin gestation.    The patient has a history of two prior full-term uncomplicated vaginal deliveries.  Her past medical history is significant for depression.  The only medication she is currently taking is Zofran.  As the patient has not had any prenatal care, she has not had any screening tests for fetal aneuploidy drawn in her current pregnancy.  On today's exam, a demise of twin B was noted.  The fetal biometry measurements obtained from twin B (the demised fetus) measures about 4 weeks behind her dates.  The patient was advised that it is uncertain if the demise occurred a while ago or if the demise occurred due to selective growth restriction of twin B.  As there appears to be postmortem changes associated with twin B, I suspect that the demise most likely occurred a while ago.  The fetal biometry measurements obtained for twin A (the live fetus) appears appropriate for her gestational age.  There was normal amniotic fluid noted around twin A.  Doppler studies of the umbilical arteries performed in twin A continues to show normal forward flow.  MCA Doppler studies performed in twin A did not show any signs of fetal anemia.  Fetal movements were noted throughout today's exam in twin A.  The views of the fetal anatomy were limited today due to her advanced gestational age.  The patient was advised that due to a demise of one fetus in a monochorionic, diamniotic twin gestation, there is a roughly 25% risk of cotwin loss and there may be a 25% risk of  neurodevelopmental injury in the surviving fetus.  The patient was advised that her baby will have to be examined after birth to determine if any neurodevelopmental injury is present.  The pediatricians should be notified at the time of delivery that this was a monochorionic twin pregnancy where a demise of one twin occurred.  A brain MRI should be obtained following delivery.  Due to the risk of demise in the surviving twin, we will continue to follow her with weekly biophysical profiles.  Fetal kick count instructions were reviewed.    To avoid another demise, delivery may be considered at around 36 weeks.  A total of 30 minutes was spent counseling and coordinating the care for this patient.  Greater than 50% of the time was spent in direct face-to-face contact.

## 2020-09-27 ENCOUNTER — Other Ambulatory Visit: Payer: Self-pay | Admitting: *Deleted

## 2020-09-27 DIAGNOSIS — O0933 Supervision of pregnancy with insufficient antenatal care, third trimester: Secondary | ICD-10-CM

## 2020-09-28 LAB — CYTOLOGY - PAP
Chlamydia: NEGATIVE
Comment: NEGATIVE
Comment: NEGATIVE
Comment: NORMAL
Diagnosis: NEGATIVE
Neisseria Gonorrhea: NEGATIVE
Trichomonas: NEGATIVE

## 2020-09-29 ENCOUNTER — Observation Stay (HOSPITAL_COMMUNITY)
Admission: AD | Admit: 2020-09-29 | Discharge: 2020-09-30 | Disposition: A | Discharge status: Home / Self Care | Payer: Medicaid Other | Attending: Obstetrics & Gynecology | Admitting: Obstetrics & Gynecology

## 2020-09-29 ENCOUNTER — Encounter (HOSPITAL_COMMUNITY): Payer: Self-pay | Admitting: Obstetrics & Gynecology

## 2020-09-29 ENCOUNTER — Other Ambulatory Visit: Payer: Self-pay

## 2020-09-29 DIAGNOSIS — O99210 Obesity complicating pregnancy, unspecified trimester: Secondary | ICD-10-CM | POA: Diagnosis present

## 2020-09-29 DIAGNOSIS — O23593 Infection of other part of genital tract in pregnancy, third trimester: Secondary | ICD-10-CM | POA: Diagnosis not present

## 2020-09-29 DIAGNOSIS — B9689 Other specified bacterial agents as the cause of diseases classified elsewhere: Secondary | ICD-10-CM

## 2020-09-29 DIAGNOSIS — Z3A31 31 weeks gestation of pregnancy: Secondary | ICD-10-CM

## 2020-09-29 DIAGNOSIS — Z20822 Contact with and (suspected) exposure to covid-19: Secondary | ICD-10-CM | POA: Insufficient documentation

## 2020-09-29 DIAGNOSIS — O364XX0 Maternal care for intrauterine death, not applicable or unspecified: Secondary | ICD-10-CM | POA: Diagnosis present

## 2020-09-29 DIAGNOSIS — O30033 Twin pregnancy, monochorionic/diamniotic, third trimester: Secondary | ICD-10-CM | POA: Diagnosis not present

## 2020-09-29 DIAGNOSIS — O26893 Other specified pregnancy related conditions, third trimester: Secondary | ICD-10-CM | POA: Diagnosis present

## 2020-09-29 LAB — DIFFERENTIAL
Abs Immature Granulocytes: 0.04 10*3/uL (ref 0.00–0.07)
Basophils Absolute: 0.1 10*3/uL (ref 0.0–0.1)
Basophils Relative: 1 %
Eosinophils Absolute: 0 10*3/uL (ref 0.0–0.5)
Eosinophils Relative: 0 %
Immature Granulocytes: 1 %
Lymphocytes Relative: 20 %
Lymphs Abs: 1.6 10*3/uL (ref 0.7–4.0)
Monocytes Absolute: 0.4 10*3/uL (ref 0.1–1.0)
Monocytes Relative: 4 %
Neutro Abs: 6.2 10*3/uL (ref 1.7–7.7)
Neutrophils Relative %: 74 %

## 2020-09-29 LAB — CBC
HCT: 35.6 % — ABNORMAL LOW (ref 36.0–46.0)
Hemoglobin: 12 g/dL (ref 12.0–15.0)
MCH: 30.4 pg (ref 26.0–34.0)
MCHC: 33.7 g/dL (ref 30.0–36.0)
MCV: 90.1 fL (ref 80.0–100.0)
Platelets: 242 10*3/uL (ref 150–400)
RBC: 3.95 MIL/uL (ref 3.87–5.11)
RDW: 12.3 % (ref 11.5–15.5)
WBC: 8.3 10*3/uL (ref 4.0–10.5)
nRBC: 0 % (ref 0.0–0.2)

## 2020-09-29 LAB — URINALYSIS, ROUTINE W REFLEX MICROSCOPIC
Bilirubin Urine: NEGATIVE
Glucose, UA: NEGATIVE mg/dL
Hgb urine dipstick: NEGATIVE
Ketones, ur: 5 mg/dL — AB
Nitrite: NEGATIVE
Protein, ur: NEGATIVE mg/dL
Specific Gravity, Urine: 1.014 (ref 1.005–1.030)
WBC, UA: >50 WBC/hpf — ABNORMAL HIGH (ref 0–5)
pH: 8 (ref 5.0–8.0)

## 2020-09-29 LAB — RESP PANEL BY RT-PCR (FLU A&B, COVID) ARPGX2
Influenza A by PCR: NEGATIVE
Influenza B by PCR: NEGATIVE
SARS Coronavirus 2 by RT PCR: NEGATIVE

## 2020-09-29 LAB — WET PREP, GENITAL
Sperm: NONE SEEN
Trich, Wet Prep: NONE SEEN
Yeast Wet Prep HPF POC: NONE SEEN

## 2020-09-29 LAB — URINE CULTURE, OB REFLEX

## 2020-09-29 LAB — OB RESULTS CONSOLE GBS: GBS: NEGATIVE

## 2020-09-29 LAB — HEPATITIS B SURFACE ANTIGEN: Hepatitis B Surface Ag: NONREACTIVE

## 2020-09-29 LAB — TYPE AND SCREEN
ABO/RH(D): A POS
Antibody Screen: NEGATIVE

## 2020-09-29 LAB — HIV ANTIBODY (ROUTINE TESTING W REFLEX): HIV Screen 4th Generation wRfx: NONREACTIVE

## 2020-09-29 LAB — OB RESULTS CONSOLE GC/CHLAMYDIA: Gonorrhea: NEGATIVE

## 2020-09-29 LAB — CULTURE, OB URINE

## 2020-09-29 MED ORDER — PRENATAL MULTIVITAMIN CH
1.0000 | ORAL_TABLET | Freq: Every day | ORAL | Status: DC
  Administered 2020-09-30: 1 via ORAL
  Filled 2020-09-29: qty 1

## 2020-09-29 MED ORDER — TERBUTALINE SULFATE 1 MG/ML IJ SOLN
0.2500 mg | Freq: Once | INTRAMUSCULAR | Status: AC
  Administered 2020-09-29: 0.25 mg via SUBCUTANEOUS
  Filled 2020-09-29: qty 1

## 2020-09-29 MED ORDER — MAGNESIUM SULFATE BOLUS VIA INFUSION
6.0000 g | Freq: Once | INTRAVENOUS | Status: AC
  Administered 2020-09-29: 6 g via INTRAVENOUS
  Filled 2020-09-29: qty 1000

## 2020-09-29 MED ORDER — BETAMETHASONE SOD PHOS & ACET 6 (3-3) MG/ML IJ SUSP
12.0000 mg | INTRAMUSCULAR | Status: AC
  Administered 2020-09-29 – 2020-09-30 (×2): 12 mg via INTRAMUSCULAR
  Filled 2020-09-29: qty 5

## 2020-09-29 MED ORDER — NIFEDIPINE ER OSMOTIC RELEASE 30 MG PO TB24
30.0000 mg | ORAL_TABLET | Freq: Two times a day (BID) | ORAL | Status: DC
  Administered 2020-09-29 – 2020-09-30 (×3): 30 mg via ORAL
  Filled 2020-09-29 (×3): qty 1

## 2020-09-29 MED ORDER — LACTATED RINGERS IV SOLN
INTRAVENOUS | Status: DC
  Administered 2020-09-29: 1000 mL via INTRAVENOUS

## 2020-09-29 MED ORDER — FAMOTIDINE 20 MG PO TABS
20.0000 mg | ORAL_TABLET | Freq: Every day | ORAL | Status: DC
  Administered 2020-09-29 – 2020-09-30 (×2): 20 mg via ORAL
  Filled 2020-09-29 (×2): qty 1

## 2020-09-29 MED ORDER — MAGNESIUM SULFATE 40 GM/1000ML IV SOLN
2.0000 g/h | INTRAVENOUS | Status: AC
  Administered 2020-09-30: 2 g/h via INTRAVENOUS
  Filled 2020-09-29 (×2): qty 1000

## 2020-09-29 MED ORDER — ZOLPIDEM TARTRATE 5 MG PO TABS: 5.0000 mg | ORAL_TABLET | Freq: Every evening | ORAL | Status: DC | PRN

## 2020-09-29 MED ORDER — ACETAMINOPHEN 325 MG PO TABS
650.0000 mg | ORAL_TABLET | ORAL | Status: DC | PRN
  Administered 2020-09-29 – 2020-09-30 (×2): 650 mg via ORAL
  Filled 2020-09-29 (×2): qty 2

## 2020-09-29 MED ORDER — CYCLOBENZAPRINE HCL 10 MG PO TABS: 10.0000 mg | ORAL_TABLET | Freq: Two times a day (BID) | ORAL | Status: DC | PRN

## 2020-09-29 MED ORDER — CYCLOBENZAPRINE HCL 10 MG PO TABS
10.0000 mg | ORAL_TABLET | Freq: Three times a day (TID) | ORAL | Status: DC | PRN
  Administered 2020-09-29 – 2020-09-30 (×3): 10 mg via ORAL
  Filled 2020-09-29 (×3): qty 1

## 2020-09-29 MED ORDER — DOCUSATE SODIUM 100 MG PO CAPS
100.0000 mg | ORAL_CAPSULE | Freq: Every day | ORAL | Status: DC
  Administered 2020-09-30: 100 mg via ORAL
  Filled 2020-09-29: qty 1

## 2020-09-29 MED ORDER — CALCIUM CARBONATE ANTACID 500 MG PO CHEW: 2.0000 | CHEWABLE_TABLET | ORAL | Status: DC | PRN

## 2020-09-29 MED ORDER — OXYCODONE HCL 5 MG PO TABS
5.0000 mg | ORAL_TABLET | ORAL | Status: DC | PRN
  Administered 2020-09-30: 5 mg via ORAL
  Filled 2020-09-29: qty 1

## 2020-09-29 NOTE — Consult Note (Signed)
Neonatology Consult  Note:  At the request of the patients obstetrician Dr. Anyanwu I met with Ms. Majel Wacker who is a 25 y.o. G4P2012 at [redacted]w[redacted]d presenting with preterm labor.  Pregnancy complicated by mon-di twin gestation with IUFD of baby B on 6/30. She is receiving BMZ and magnesium.  We reviewed initial delivery room management, including CPAP, [place], and low but certainly possible need for intubation for surfactant administration.  We discussed feeding immaturity and need for full po intake with multiple days of good weight gain and no apnea or bradycardia before discharge.  We reviewed increased risk of jaundice, infection, and temperature instability.   Discussed likely length of stay.  Thank you for allowing us to participate in her care.  Please call with questions.   , DO  Neonatologist  The total length of face-to-face or floor / unit time for this encounter was 25 minutes.  Counseling and / or coordination of care was greater than fifty percent of the time.    

## 2020-09-29 NOTE — MAU Note (Addendum)
Ems arrival.  Report from EMS: Started having cramping in upper abd this morning.  No bleeding, leaking or d/c. Twin preg, at appt this week, was told one is a demise. Constipated, no bm in a wk . Pain increases when eating or drinking.  vss

## 2020-09-29 NOTE — MAU Note (Signed)
Pain in upper abd, started at 0900, stopped at 1200.  Fell asleep. Around 2 she got up, when she sat up, it started up again.  Upper abd, sharp pain and abd gets tight. No bleeding or leaking. Denies vomiting or diarrhea.  Feels like she is hungry, but when she eats, it starts the pain back up.

## 2020-09-29 NOTE — H&P (Signed)
History     CSN: [id number]  Arrival date and time: 09/29/20 1437   Event Date/Time   First Provider Initiated Contact with Patient 09/29/20 1513      Chief Complaint  Patient presents with   Abdominal Pain   HPI Rachel Stuart is a 25 y.o. [id number] at [redacted]w[redacted]d who presents with abdominal pain and back pain. She reports it started at 0800 and lasted until about 12 when she was able to fall asleep for a nap. She woke up at 1400 and was still having severe abdominal pain so she called EMS and came to the hospital. She reports the pain is every 3-4 minutes and she rates the pain a 10/10. She denies any bleeding or leaking. Reports normal fetal movement of Baby A.  She was diagnosed with an IUFD of baby B on 6/30 at her New OB appointment. She states she was checked at her appointment and found to be 1cm dilated. She has another appointment on Thursday for her new OB labs.  OB History     Gravida  4   Para  2   Term  2   Preterm  0   AB  1   Living  2      SAB  1   IAB  0   Ectopic  0   Multiple  1   Live Births  2        Obstetric Comments  Pt stated she lost consciousness during labor due to intense pain. After receiving epidural birth went well.          Past Medical History:  Diagnosis Date   Ankle fracture    Anxiety    Depression    zoloft   Obesity    Ovarian cyst     Past Surgical History:  Procedure Laterality Date   NO PAST SURGERIES      Family History  Problem Relation Age of Onset   Hypertension Maternal Grandmother    Diabetes Maternal Grandmother    Stroke Maternal Grandmother    Cancer Maternal Grandmother        uterine   Depression Maternal Grandmother    Diabetes Paternal Grandfather    Depression Mother    Mental illness Mother    Hypertension Mother    Cancer Mother    Cancer Father     Social History   Tobacco Use   Smoking status: Never   Smokeless tobacco: Never  Vaping Use   Vaping Use: Never used  Substance  Use Topics   Alcohol use: Yes   Drug use: No    Allergies:  Allergies  Allergen Reactions   Amoxicillin Swelling    Lips swell Has patient had a PCN reaction causing immediate rash, facial/tongue/throat swelling, SOB or lightheadedness with hypotension: yes Has patient had a PCN reaction causing severe rash involving mucus membranes or skin necrosis: no Has patient had a PCN reaction that required hospitalization: no Has patient had a PCN reaction occurring within the last 10 years: no If all of the above answers are "NO", then may proceed with Cephalosporin use. CAN TAKE KEFLEX     Medications Prior to Admission  Medication Sig Dispense Refill Last Dose   acetaminophen (TYLENOL) 500 MG tablet Take 500 mg by mouth every 6 (six) hours as needed for mild pain or headache.       cyclobenzaprine (FLEXERIL) 10 MG tablet Take 1 tablet (10 mg total) by mouth 2 (two) times daily as  needed for muscle spasms. (Patient not taking: No sig reported) 20 tablet 0    ondansetron (ZOFRAN ODT) 4 MG disintegrating tablet Take 1 tablet (4 mg total) by mouth every 8 (eight) hours as needed for nausea or vomiting. 15 tablet 0     Review of Systems  Constitutional: Negative.  Negative for fatigue and fever.  HENT: Negative.    Respiratory: Negative.  Negative for shortness of breath.   Cardiovascular: Negative.  Negative for chest pain.  Gastrointestinal:  Positive for abdominal pain. Negative for constipation, diarrhea, nausea and vomiting.  Genitourinary: Negative.  Negative for dysuria, vaginal bleeding and vaginal discharge.  Musculoskeletal:  Positive for back pain.  Neurological: Negative.  Negative for dizziness and headaches.  Physical Exam   Blood pressure (!) 112/59, pulse 65, temperature 98.5 F (36.9 C), temperature source Oral, resp. rate 18, last menstrual period 02/12/2020, SpO2 100 %, unknown if currently breastfeeding.  Physical Exam Vitals and nursing note reviewed.   Constitutional:      General: She is not in acute distress.    Appearance: She is well-developed.  HENT:     Head: Normocephalic.  Eyes:     Pupils: Pupils are equal, round, and reactive to light.  Cardiovascular:     Rate and Rhythm: Normal rate and regular rhythm.     Heart sounds: Normal heart sounds.  Pulmonary:     Effort: Pulmonary effort is normal. No respiratory distress.     Breath sounds: Normal breath sounds.  Abdominal:     General: Bowel sounds are normal. There is no distension.     Palpations: Abdomen is soft.     Tenderness: There is no abdominal tenderness.  Skin:    General: Skin is warm and dry.  Neurological:     Mental Status: She is alert and oriented to person, place, and time.  Psychiatric:        Mood and Affect: Mood normal.        Behavior: Behavior normal.        Thought Content: Thought content normal.        Judgment: Judgment normal.   Fetal Tracing:  Baseline: 140 Variability: moderate Accels: 15x15 Decels: none  Toco: 3-4 by palpation  Dilation: 3 Effacement (%): 50 Station: -1   MAU Course  Procedures  MDM UA  Consulted with Dr. Macon Large- recommends admission to [facility] for BMZ and magnesium Plan of care discussed with patient and patient verbalized understanding.   Assessment and Plan  Preterm labor IUFD of twin B  -Admit to [facility] -Care turned over to MD  Rolm Bookbinder CNM 09/29/2020, 3:13 PM

## 2020-09-30 DIAGNOSIS — N760 Acute vaginitis: Secondary | ICD-10-CM | POA: Diagnosis not present

## 2020-09-30 DIAGNOSIS — B9689 Other specified bacterial agents as the cause of diseases classified elsewhere: Secondary | ICD-10-CM

## 2020-09-30 DIAGNOSIS — O364XX2 Maternal care for intrauterine death, fetus 2: Secondary | ICD-10-CM | POA: Diagnosis not present

## 2020-09-30 DIAGNOSIS — Z3A31 31 weeks gestation of pregnancy: Secondary | ICD-10-CM

## 2020-09-30 DIAGNOSIS — O30033 Twin pregnancy, monochorionic/diamniotic, third trimester: Secondary | ICD-10-CM

## 2020-09-30 DIAGNOSIS — O99212 Obesity complicating pregnancy, second trimester: Secondary | ICD-10-CM

## 2020-09-30 LAB — RPR: RPR Ser Ql: NONREACTIVE

## 2020-09-30 MED ORDER — NIFEDIPINE ER 30 MG PO TB24: 30.0000 mg | ORAL_TABLET | Freq: Two times a day (BID) | ORAL | 1 refills | Status: DC

## 2020-09-30 MED ORDER — METRONIDAZOLE 500 MG PO TABS
500.0000 mg | ORAL_TABLET | Freq: Two times a day (BID) | ORAL | Status: DC
  Administered 2020-09-30 (×2): 500 mg via ORAL
  Filled 2020-09-30 (×2): qty 1

## 2020-09-30 MED ORDER — METRONIDAZOLE 500 MG PO TABS: 500.0000 mg | ORAL_TABLET | Freq: Two times a day (BID) | ORAL | 0 refills | Status: DC

## 2020-09-30 NOTE — Progress Notes (Signed)
Pt stable at d/c with AVS reviewed and all questions answered. No complaints of bleeding, leaking, or uc. Vitals wnl. All pt's belongings at bedside were gathered and pt ambulated off unit.

## 2020-09-30 NOTE — Discharge Summary (Signed)
Physician Discharge Summary  Patient ID: Rachel Stuart MRN: [id number] DOB/AGE: 27-11-1995 27 y.o.  Admit date: 09/29/2020 Discharge date: 09/30/2020  Admission Diagnoses: preterm labor in third trimester  Discharge Diagnoses:  Principal Problem:   Preterm labor in third trimester Active Problems:   Obesity during pregnancy in second trimester   Monochorionic diamniotic twin gestation in third trimester   Fetal demise of Twin B, greater than 22 weeks, antepartum   [redacted] weeks gestation of pregnancy   Bacterial vaginitis   Discharged Condition: good  Hospital Course: Patient admitted with preterm labor. She received tocolysis with magnesium sulfate and procardia. Patient completed betamethasone. Patient requested to be discharged this evening. Cervical exam unchanged at 3/50/ballottable. Rare contractions reported. Patient found stable for discharge home. Patient to follow up as schedule on 10/04/20  Consults: None    Discharge Exam: Blood pressure (!) 104/58, pulse 74, temperature 98.5 F (36.9 C), temperature source Oral, resp. rate 18, height 5\' 3"  (1.6 m), weight 79.7 kg, last menstrual period 02/12/2020, SpO2 99 %, unknown if currently breastfeeding. GENERAL: Well-developed, well-nourished female in no acute distress.  HEART: Regular rate and rhythm. ABDOMEN: Soft, nontender, gravid PELVIC: 3/50/ballottable EXTREMITIES: No cyanosis, clubbing, or edema, 2+ distal pulses.  FHT: baseline 135, mod variability, +accels, no decels Toco: rare contractions  Disposition:  There are no questions and answers to display.         Allergies as of 09/30/2020       Reactions   Amoxicillin Swelling   Lips swell Has patient had a PCN reaction causing immediate rash, facial/tongue/throat swelling, SOB or lightheadedness with hypotension: yes Has patient had a PCN reaction causing severe rash involving mucus membranes or skin necrosis: no Has patient had a PCN reaction that required  hospitalization: no Has patient had a PCN reaction occurring within the last 10 years: no If all of the above answers are "NO", then may proceed with Cephalosporin use. CAN TAKE KEFLEX        Medication List     STOP taking these medications    cyclobenzaprine 10 MG tablet Commonly known as: FLEXERIL       TAKE these medications    acetaminophen 500 MG tablet Commonly known as: TYLENOL Take 500 mg by mouth every 6 (six) hours as needed for mild pain or headache.   famotidine 20 MG tablet Commonly known as: PEPCID Take 20 mg by mouth daily.   metroNIDAZOLE 500 MG tablet Commonly known as: FLAGYL Take 1 tablet (500 mg total) by mouth every 12 (twelve) hours.   NIFEdipine 30 MG 24 hr tablet Commonly known as: ADALAT CC Take 1 tablet (30 mg total) by mouth 2 (two) times daily.   ondansetron 4 MG disintegrating tablet Commonly known as: Zofran ODT Take 1 tablet (4 mg total) by mouth every 8 (eight) hours as needed for nausea or vomiting.   ranitidine 150 MG capsule Commonly known as: ZANTAC Take 150 mg by mouth 2 (two) times daily.        Follow-up Information     [facility] Follow up on 10/04/2020.   Specialty: Obstetrics and Gynecology Why: As scheduled for routine care Contact information: [address] [place] [phone]                Signed: [phone] 09/30/2020, 8:40 PM

## 2020-09-30 NOTE — Progress Notes (Signed)
FACULTY PRACTICE ANTEPARTUM COMPREHENSIVE PROGRESS NOTE  Rachel Stuart is a 25 y.o. [id number] at [redacted]w[redacted]d who is admitted for Preterm labor.  Estimated Date of Delivery: 11/27/20 Fetal presentation is cephalic.  Length of Stay:  1 Days. Admitted 09/29/2020  Subjective: Had some contractions this morning, was examined by RN and had unchanged cervical exam.  Patient reports good fetal movement.  She reports irregular uterine contractions, no bleeding and no loss of fluid per vagina.  Vitals:  Blood pressure 117/61, pulse 66, temperature 98.2 F (36.8 C), temperature source Oral, resp. rate 16, height 5\' 3"  (1.6 m), weight 79.7 kg, last menstrual period 02/12/2020, SpO2 100 %, unknown if currently breastfeeding. Physical Examination: CONSTITUTIONAL: Well-developed, well-nourished female in no acute distress.  SKIN: Skin is warm and dry. No rash noted. Not diaphoretic. No erythema. No pallor. NEUROLGIC: Alert and oriented to person, place, and time. Normal reflexes, muscle tone coordination. No cranial nerve deficit noted. PSYCHIATRIC: Normal mood and affect. Normal behavior. Normal judgment and thought content. CARDIOVASCULAR: Normal heart rate noted, regular rhythm RESPIRATORY: Effort and breath sounds normal, no problems with respiration noted MUSCULOSKELETAL: Normal range of motion. No edema and no tenderness. 2+ distal pulses. ABDOMEN: Soft, nontender, nondistended, gravid. CERVIX: Dilation: 3 Effacement (%): 50 Cervical Position: Posterior Station: -1 Exam by:: [email or web address], RN  Fetal monitoring: FHR: 130 bpm, Variability: moderate, Accelerations: Present, Decelerations: Absent  Uterine activity: No contractions per hour  Results for orders placed or performed during the hospital encounter of 09/29/20 (from the past 48 hour(s))  Urinalysis, Routine w reflex microscopic Urine, Clean Catch     Status: Abnormal   Collection Time: 09/29/20  3:32 PM  Result Value Ref Range   Color, Urine  AMBER (A) YELLOW    Comment: BIOCHEMICALS MAY BE AFFECTED BY COLOR   APPearance CLOUDY (A) CLEAR   Specific Gravity, Urine 1.014 1.005 - 1.030   pH 8.0 5.0 - 8.0   Glucose, UA NEGATIVE NEGATIVE mg/dL   Hgb urine dipstick NEGATIVE NEGATIVE   Bilirubin Urine NEGATIVE NEGATIVE   Ketones, ur 5 (A) NEGATIVE mg/dL   Protein, ur NEGATIVE NEGATIVE mg/dL   Nitrite NEGATIVE NEGATIVE   Leukocytes,Ua LARGE (A) NEGATIVE   RBC / HPF 0-5 0 - 5 RBC/hpf   WBC, UA >50 (H) 0 - 5 WBC/hpf   Bacteria, UA RARE (A) NONE SEEN   Squamous Epithelial / LPF 21-50 0 - 5   WBC Clumps PRESENT    Mucus PRESENT    Sperm, UA PRESENT    Crystals PRESENT (A) NEGATIVE    Comment: Performed at [facility], [address], [place], [place]  Type and screen [facility]     Status: None   Collection Time: 09/29/20  3:38 PM  Result Value Ref Range   ABO/RH(D) A POS    Antibody Screen NEG    Sample Expiration      10/02/2020,2359 Performed at [facility], [address], [place], [place]   Resp Panel by RT-PCR (Flu A&B, Covid) Nasopharyngeal Swab     Status: None   Collection Time: 09/29/20  3:39 PM   Specimen: Nasopharyngeal Swab; Nasopharyngeal(NP) swabs in vial transport medium  Result Value Ref Range   SARS Coronavirus 2 by RT PCR NEGATIVE NEGATIVE    Comment: (NOTE) SARS-CoV-2 target nucleic acids are NOT DETECTED.  The SARS-CoV-2 RNA is generally detectable in upper respiratory specimens during the acute phase of infection. The lowest concentration of  SARS-CoV-2 viral copies this assay can detect is 138 copies/mL. A negative result does not preclude SARS-Cov-2 infection and should not be used as the sole basis for treatment or other patient management decisions. A negative result may occur with  improper specimen collection/handling, submission of specimen other than nasopharyngeal swab, presence of viral mutation(s) within the areas targeted by this assay,  and inadequate number of viral copies(<138 copies/mL). A negative result must be combined with clinical observations, patient history, and epidemiological information. The expected result is Negative.  Fact Sheet for Patients:  BloggerCourse.com  Fact Sheet for Healthcare Providers:  SeriousBroker.it  This test is no t yet approved or cleared by the [place] FDA and  has been authorized for detection and/or diagnosis of SARS-CoV-2 by FDA under an Emergency Use Authorization (EUA). This EUA will remain  in effect (meaning this test can be used) for the duration of the COVID-19 declaration under Section 564(b)(1) of the Act, 21 U.S.C.section 360bbb-3(b)(1), unless the authorization is terminated  or revoked sooner.       Influenza A by PCR NEGATIVE NEGATIVE   Influenza B by PCR NEGATIVE NEGATIVE    Comment: (NOTE) The Xpert Xpress SARS-CoV-2/FLU/RSV plus assay is intended as an aid in the diagnosis of influenza from Nasopharyngeal swab specimens and should not be used as a sole basis for treatment. Nasal washings and aspirates are unacceptable for Xpert Xpress SARS-CoV-2/FLU/RSV testing.  Fact Sheet for Patients: BloggerCourse.com  Fact Sheet for Healthcare Providers: SeriousBroker.it  This test is not yet approved or cleared by the [place] FDA and has been authorized for detection and/or diagnosis of SARS-CoV-2 by FDA under an Emergency Use Authorization (EUA). This EUA will remain in effect (meaning this test can be used) for the duration of the COVID-19 declaration under Section 564(b)(1) of the Act, 21 U.S.C. section 360bbb-3(b)(1), unless the authorization is terminated or revoked.  Performed at [facility], [address], [place], [place] [address]   Wet prep, genital     Status: Abnormal   Collection Time: 09/29/20  4:03 PM   Specimen: Vaginal   Result Value Ref Range   Yeast Wet Prep HPF POC NONE SEEN NONE SEEN   Trich, Wet Prep NONE SEEN NONE SEEN   Clue Cells Wet Prep HPF POC PRESENT (A) NONE SEEN   WBC, Wet Prep HPF POC MODERATE (A) NONE SEEN   Sperm NONE SEEN     Comment: Performed at [facility], [address] [place], [place] [address]  CBC on admission     Status: Abnormal   Collection Time: 09/29/20  4:04 PM  Result Value Ref Range   WBC 8.3 4.0 - 10.5 K/uL   RBC 3.95 3.87 - 5.11 MIL/uL   Hemoglobin 12.0 12.0 - 15.0 g/dL   HCT 97.3 (L) 53.2 - 99.2 %   MCV 90.1 80.0 - 100.0 fL   MCH 30.4 26.0 - 34.0 pg   MCHC 33.7 30.0 - 36.0 g/dL   RDW 42.6 83.4 - 19.6 %   Platelets 242 150 - 400 K/uL   nRBC 0.0 0.0 - 0.2 %    Comment: Performed at [facility], [address] [place], [place] [address]  Differential     Status: None   Collection Time: 09/29/20  4:04 PM  Result Value Ref Range   Neutrophils Relative % 74 %   Neutro Abs 6.2 1.7 - 7.7 K/uL   Lymphocytes Relative 20 %   Lymphs  Abs 1.6 0.7 - 4.0 K/uL   Monocytes Relative 4 %   Monocytes Absolute 0.4 0.1 - 1.0 K/uL   Eosinophils Relative 0 %   Eosinophils Absolute 0.0 0.0 - 0.5 K/uL   Basophils Relative 1 %   Basophils Absolute 0.1 0.0 - 0.1 K/uL   Immature Granulocytes 1 %   Abs Immature Granulocytes 0.04 0.00 - 0.07 K/uL    Comment: Performed at [facility], [address] [place], [place] [address]  HIV Antibody (routine testing w rflx)     Status: None   Collection Time: 09/29/20  4:04 PM  Result Value Ref Range   HIV Screen 4th Generation wRfx Non Reactive Non Reactive    Comment: Performed at [facility], [address] [place], [place] [address]  Hepatitis B surface antigen     Status: None   Collection Time: 09/29/20  4:50 PM  Result Value Ref Range   Hepatitis B Surface Ag NON REACTIVE NON REACTIVE    Comment: Performed at [facility], [address] [place], [place] [address]    No results  found.  Current scheduled medications  betamethasone acetate-betamethasone sodium phosphate  12 mg Intramuscular Q24 Hr x 2   docusate sodium  100 mg Oral Daily   famotidine  20 mg Oral Daily   metroNIDAZOLE  500 mg Oral Q12H   NIFEdipine  30 mg Oral BID   prenatal multivitamin  1 tablet Oral Q1200    I have reviewed the patient's current medications.  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 Magnesium sulfate will be turned off then, and will monitor patient.  Possible discharge to home later today or tomorrow if stable. Category 1 FHR tracing, s/p Neonatology consult Metronidazole ordered for BV Continue routine antenatal care.   Jaynie Collins, MD, FACOG Obstetrician & Gynecologist, [facility] for [employer], [facility]

## 2020-10-01 LAB — CULTURE, BETA STREP (GROUP B ONLY)

## 2020-10-02 LAB — GC/CHLAMYDIA PROBE AMP (~~LOC~~) NOT AT ARMC
Chlamydia: NEGATIVE
Comment: NEGATIVE
Comment: NORMAL
Neisseria Gonorrhea: NEGATIVE

## 2020-10-02 LAB — RUBELLA SCREEN: Rubella: 3.01 index (ref >0.99)

## 2020-10-03 ENCOUNTER — Ambulatory Visit: Payer: Medicaid Other | Admitting: *Deleted

## 2020-10-03 ENCOUNTER — Other Ambulatory Visit: Payer: Medicaid Other

## 2020-10-03 ENCOUNTER — Other Ambulatory Visit: Payer: Self-pay | Admitting: Obstetrics

## 2020-10-03 ENCOUNTER — Other Ambulatory Visit: Payer: Self-pay

## 2020-10-03 ENCOUNTER — Encounter: Payer: Self-pay | Admitting: *Deleted

## 2020-10-03 ENCOUNTER — Ambulatory Visit: Payer: Medicaid Other | Attending: Obstetrics

## 2020-10-03 VITALS — BP 132/69 | HR 57

## 2020-10-03 DIAGNOSIS — O283 Abnormal ultrasonic finding on antenatal screening of mother: Secondary | ICD-10-CM | POA: Diagnosis present

## 2020-10-03 DIAGNOSIS — O0990 Supervision of high risk pregnancy, unspecified, unspecified trimester: Secondary | ICD-10-CM | POA: Diagnosis present

## 2020-10-03 DIAGNOSIS — O0933 Supervision of pregnancy with insufficient antenatal care, third trimester: Secondary | ICD-10-CM | POA: Insufficient documentation

## 2020-10-03 NOTE — Procedures (Signed)
Rachel Stuart 1995/06/18 [redacted]w[redacted]d  Fetus A Non-Stress Test Interpretation for 10/03/20  Indication: Unsatisfactory BPP  Fetal Heart Rate A Mode: External Baseline Rate (A): 140 bpm Variability: Moderate Accelerations: 15 x 15 Decelerations: None Multiple birth?: No  Uterine Activity Mode: Palpation, Toco Contraction Frequency (min): 8-9 Contraction Duration (sec): 40-60 Contraction Quality: Mild Resting Tone Palpated: Relaxed Resting Time: Adequate  Interpretation (Fetal Testing) Nonstress Test Interpretation: Reactive Overall Impression: Reassuring for gestational age Comments: Dr. Judeth Cornfield reviewed tracing

## 2020-10-04 ENCOUNTER — Ambulatory Visit (INDEPENDENT_AMBULATORY_CARE_PROVIDER_SITE_OTHER): Payer: Medicaid Other | Admitting: Family Medicine

## 2020-10-04 ENCOUNTER — Other Ambulatory Visit: Payer: Self-pay | Admitting: Obstetrics

## 2020-10-04 ENCOUNTER — Other Ambulatory Visit: Payer: Self-pay

## 2020-10-04 ENCOUNTER — Other Ambulatory Visit: Payer: Medicaid Other

## 2020-10-04 VITALS — BP 121/72 | HR 71 | Wt 176.2 lb

## 2020-10-04 DIAGNOSIS — O0990 Supervision of high risk pregnancy, unspecified, unspecified trimester: Secondary | ICD-10-CM

## 2020-10-04 DIAGNOSIS — O364XX2 Maternal care for intrauterine death, fetus 2: Secondary | ICD-10-CM

## 2020-10-04 DIAGNOSIS — O0933 Supervision of pregnancy with insufficient antenatal care, third trimester: Secondary | ICD-10-CM

## 2020-10-04 NOTE — Patient Instructions (Signed)

## 2020-10-04 NOTE — Progress Notes (Signed)
   PRENATAL VISIT NOTE  Subjective:  Rachel Stuart is a 25 y.o. [phone] at [redacted]w[redacted]d being seen today for ongoing prenatal care.  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  Patient reports no complaints.  Contractions: Not present. Vag. Bleeding: None.  Movement: Present. Denies leaking of fluid.   The following portions of the patient's history were reviewed and updated as appropriate: allergies, current medications, past family history, past medical history, past social history, past surgical history and problem list.   Objective:   Vitals:   10/04/20 0856  BP: 121/72  Pulse: 71  Weight: 176 lb 3.2 oz (79.9 kg)    Fetal Status: Fetal Heart Rate (bpm): 140 Fundal Height: 33 cm Movement: Present     General:  Alert, oriented and cooperative. Patient is in no acute distress.  Skin: Skin is warm and dry. No rash noted.   Cardiovascular: Normal heart rate noted  Respiratory: Normal respiratory effort, no problems with respiration noted  Abdomen: Soft, gravid, appropriate for gestational age.  Pain/Pressure: Absent     Pelvic: Cervical exam deferred        Extremities: Normal range of motion.  Edema: None  Mental Status: Normal mood and affect. Normal behavior. Normal judgment and thought content.   Assessment and Plan:  Pregnancy: [id number] at [redacted]w[redacted]d 1. Fetal demise, greater than 22 weeks, antepartum, fetus 2 of multiple gestation Weekly testing  2. Supervision of high risk pregnancy, antepartum Doing 2 hour today--BMZ on 7/2 and 7/3-->if abnormal, might repeat as done too close to  BMZ course  3. Preterm labor in third trimester without delivery No more evidence of PTL today  Preterm labor symptoms and general obstetric precautions including but not limited to vaginal bleeding, contractions, leaking of fluid and fetal movement were reviewed in detail with the patient. Please refer to After Visit Summary for other counseling recommendations.   Return in 2 weeks (on 10/18/2020).  Future Appointments  Date Time Provider Department Center  10/09/2020  1:30 PM [facility] NURSE [facility]  10/09/2020  1:45 PM [facility] US4 [facility]  10/17/2020  2:45 PM [facility] NURSE [facility]  10/17/2020  3:00 PM [facility] US1 [facility]  10/19/2020 10:55 AM Federico Flake, MD [facility]  10/24/2020  1:15 PM [facility] NURSE [facility]  10/24/2020  1:30 PM [facility] US3 [facility]    Reva Bores, MD

## 2020-10-05 LAB — GLUCOSE TOLERANCE, 2 HOURS W/ 1HR
Glucose, 1 hour: 110 mg/dL (ref 65–179)
Glucose, 2 hour: 87 mg/dL (ref 65–152)
Glucose, Fasting: 70 mg/dL (ref 65–91)

## 2020-10-09 ENCOUNTER — Other Ambulatory Visit: Payer: Self-pay | Admitting: Obstetrics

## 2020-10-09 ENCOUNTER — Ambulatory Visit: Payer: Medicaid Other | Attending: Obstetrics

## 2020-10-09 ENCOUNTER — Encounter: Payer: Self-pay | Admitting: *Deleted

## 2020-10-09 ENCOUNTER — Ambulatory Visit: Payer: Medicaid Other | Admitting: *Deleted

## 2020-10-09 ENCOUNTER — Other Ambulatory Visit: Payer: Self-pay

## 2020-10-09 VITALS — BP 125/74 | HR 60

## 2020-10-09 DIAGNOSIS — O0990 Supervision of high risk pregnancy, unspecified, unspecified trimester: Secondary | ICD-10-CM | POA: Insufficient documentation

## 2020-10-09 DIAGNOSIS — O0933 Supervision of pregnancy with insufficient antenatal care, third trimester: Secondary | ICD-10-CM | POA: Insufficient documentation

## 2020-10-17 ENCOUNTER — Encounter: Payer: Self-pay | Admitting: *Deleted

## 2020-10-17 ENCOUNTER — Ambulatory Visit: Payer: Medicaid Other | Attending: Obstetrics

## 2020-10-17 ENCOUNTER — Ambulatory Visit: Payer: Medicaid Other | Admitting: *Deleted

## 2020-10-17 ENCOUNTER — Other Ambulatory Visit: Payer: Self-pay | Admitting: Obstetrics

## 2020-10-17 ENCOUNTER — Other Ambulatory Visit: Payer: Self-pay

## 2020-10-17 VITALS — BP 132/64 | HR 65

## 2020-10-17 DIAGNOSIS — O0933 Supervision of pregnancy with insufficient antenatal care, third trimester: Secondary | ICD-10-CM | POA: Diagnosis not present

## 2020-10-17 DIAGNOSIS — O0990 Supervision of high risk pregnancy, unspecified, unspecified trimester: Secondary | ICD-10-CM

## 2020-10-18 ENCOUNTER — Other Ambulatory Visit: Payer: Self-pay | Admitting: Obstetrics and Gynecology

## 2020-10-18 NOTE — Progress Notes (Signed)
IOL orders placed for 36 weeks, MFM rec

## 2020-10-19 ENCOUNTER — Encounter: Payer: Medicaid Other | Admitting: Family Medicine

## 2020-10-24 ENCOUNTER — Other Ambulatory Visit: Payer: Self-pay | Admitting: Advanced Practice Midwife

## 2020-10-24 ENCOUNTER — Other Ambulatory Visit: Payer: Self-pay | Admitting: Obstetrics

## 2020-10-24 ENCOUNTER — Ambulatory Visit: Payer: Medicaid Other | Admitting: *Deleted

## 2020-10-24 ENCOUNTER — Other Ambulatory Visit: Payer: Self-pay

## 2020-10-24 ENCOUNTER — Encounter: Payer: Self-pay | Admitting: *Deleted

## 2020-10-24 ENCOUNTER — Ambulatory Visit: Payer: Medicaid Other | Attending: Obstetrics

## 2020-10-24 VITALS — BP 136/66 | HR 61

## 2020-10-24 DIAGNOSIS — O0990 Supervision of high risk pregnancy, unspecified, unspecified trimester: Secondary | ICD-10-CM

## 2020-10-24 DIAGNOSIS — O0933 Supervision of pregnancy with insufficient antenatal care, third trimester: Secondary | ICD-10-CM | POA: Insufficient documentation

## 2020-10-25 ENCOUNTER — Telehealth (HOSPITAL_COMMUNITY): Payer: Self-pay | Admitting: *Deleted

## 2020-10-25 ENCOUNTER — Encounter (HOSPITAL_COMMUNITY): Payer: Self-pay | Admitting: *Deleted

## 2020-10-25 NOTE — Telephone Encounter (Signed)
Preadmission screen  

## 2020-10-29 ENCOUNTER — Other Ambulatory Visit: Payer: Self-pay

## 2020-10-29 ENCOUNTER — Encounter (HOSPITAL_COMMUNITY): Payer: Self-pay | Admitting: Obstetrics and Gynecology

## 2020-10-29 ENCOUNTER — Inpatient Hospital Stay (HOSPITAL_COMMUNITY)
Admission: AD | Admit: 2020-10-29 | Discharge: 2020-10-31 | DRG: 805 | Disposition: A | Discharge status: Home / Self Care | Payer: Medicaid Other | Attending: Obstetrics & Gynecology | Admitting: Obstetrics & Gynecology

## 2020-10-29 ENCOUNTER — Inpatient Hospital Stay (HOSPITAL_COMMUNITY): Payer: Medicaid Other | Admitting: Anesthesiology

## 2020-10-29 DIAGNOSIS — Z23 Encounter for immunization: Secondary | ICD-10-CM | POA: Diagnosis not present

## 2020-10-29 DIAGNOSIS — O30033 Twin pregnancy, monochorionic/diamniotic, third trimester: Secondary | ICD-10-CM | POA: Diagnosis present

## 2020-10-29 DIAGNOSIS — Z8659 Personal history of other mental and behavioral disorders: Secondary | ICD-10-CM

## 2020-10-29 DIAGNOSIS — Z30017 Encounter for initial prescription of implantable subdermal contraceptive: Secondary | ICD-10-CM | POA: Diagnosis not present

## 2020-10-29 DIAGNOSIS — O42913 Preterm premature rupture of membranes, unspecified as to length of time between rupture and onset of labor, third trimester: Secondary | ICD-10-CM | POA: Diagnosis present

## 2020-10-29 DIAGNOSIS — O0990 Supervision of high risk pregnancy, unspecified, unspecified trimester: Secondary | ICD-10-CM

## 2020-10-29 DIAGNOSIS — Z20822 Contact with and (suspected) exposure to covid-19: Secondary | ICD-10-CM | POA: Diagnosis present

## 2020-10-29 DIAGNOSIS — Z3A35 35 weeks gestation of pregnancy: Secondary | ICD-10-CM

## 2020-10-29 DIAGNOSIS — Z880 Allergy status to penicillin: Secondary | ICD-10-CM

## 2020-10-29 DIAGNOSIS — O99214 Obesity complicating childbirth: Secondary | ICD-10-CM | POA: Diagnosis present

## 2020-10-29 DIAGNOSIS — O364XX0 Maternal care for intrauterine death, not applicable or unspecified: Secondary | ICD-10-CM

## 2020-10-29 DIAGNOSIS — O4202 Full-term premature rupture of membranes, onset of labor within 24 hours of rupture: Secondary | ICD-10-CM

## 2020-10-29 DIAGNOSIS — O364XX2 Maternal care for intrauterine death, fetus 2: Secondary | ICD-10-CM | POA: Diagnosis present

## 2020-10-29 DIAGNOSIS — O364XX1 Maternal care for intrauterine death, fetus 1: Secondary | ICD-10-CM

## 2020-10-29 DIAGNOSIS — O0930 Supervision of pregnancy with insufficient antenatal care, unspecified trimester: Secondary | ICD-10-CM

## 2020-10-29 DIAGNOSIS — O99210 Obesity complicating pregnancy, unspecified trimester: Secondary | ICD-10-CM

## 2020-10-29 LAB — CBC
HCT: 33.1 % — ABNORMAL LOW (ref 36.0–46.0)
Hemoglobin: 11.5 g/dL — ABNORMAL LOW (ref 12.0–15.0)
MCH: 30.3 pg (ref 26.0–34.0)
MCHC: 34.7 g/dL (ref 30.0–36.0)
MCV: 87.1 fL (ref 80.0–100.0)
Platelets: 253 10*3/uL (ref 150–400)
RBC: 3.8 MIL/uL — ABNORMAL LOW (ref 3.87–5.11)
RDW: 12.8 % (ref 11.5–15.5)
WBC: 7.5 10*3/uL (ref 4.0–10.5)
nRBC: 0 % (ref 0.0–0.2)

## 2020-10-29 LAB — RESP PANEL BY RT-PCR (FLU A&B, COVID) ARPGX2
Influenza A by PCR: NEGATIVE
Influenza B by PCR: NEGATIVE
SARS Coronavirus 2 by RT PCR: NEGATIVE

## 2020-10-29 LAB — TYPE AND SCREEN
ABO/RH(D): A POS
Antibody Screen: NEGATIVE

## 2020-10-29 LAB — RPR: RPR Ser Ql: NONREACTIVE

## 2020-10-29 LAB — POCT FERN TEST: POCT Fern Test: POSITIVE

## 2020-10-29 MED ORDER — WITCH HAZEL-GLYCERIN EX PADS: 1.0000 "application " | MEDICATED_PAD | CUTANEOUS | Status: DC | PRN

## 2020-10-29 MED ORDER — PHENYLEPHRINE 40 MCG/ML (10ML) SYRINGE FOR IV PUSH (FOR BLOOD PRESSURE SUPPORT): 80.0000 ug | PREFILLED_SYRINGE | INTRAVENOUS | Status: DC | PRN

## 2020-10-29 MED ORDER — OXYTOCIN-SODIUM CHLORIDE 30-0.9 UT/500ML-% IV SOLN: 2.5000 [IU]/h | INTRAVENOUS | Status: DC

## 2020-10-29 MED ORDER — LIDOCAINE HCL (PF) 1 % IJ SOLN
INTRAMUSCULAR | Status: DC | PRN
  Administered 2020-10-29: 11 mL via EPIDURAL

## 2020-10-29 MED ORDER — OXYTOCIN-SODIUM CHLORIDE 30-0.9 UT/500ML-% IV SOLN
1.0000 m[IU]/min | INTRAVENOUS | Status: DC
  Administered 2020-10-29: 2 m[IU]/min via INTRAVENOUS
  Filled 2020-10-29: qty 500

## 2020-10-29 MED ORDER — PRENATAL MULTIVITAMIN CH
1.0000 | ORAL_TABLET | Freq: Every day | ORAL | Status: DC
  Administered 2020-10-30 – 2020-10-31 (×2): 1 via ORAL
  Filled 2020-10-29 (×2): qty 1

## 2020-10-29 MED ORDER — EPHEDRINE 5 MG/ML INJ
10.0000 mg | INTRAVENOUS | Status: DC | PRN
Start: 2020-10-29 — End: 2020-10-29

## 2020-10-29 MED ORDER — ONDANSETRON HCL 4 MG/2ML IJ SOLN: 4.0000 mg | INTRAMUSCULAR | Status: DC | PRN

## 2020-10-29 MED ORDER — BENZOCAINE-MENTHOL 20-0.5 % EX AERO
1.0000 "application " | INHALATION_SPRAY | CUTANEOUS | Status: DC | PRN
  Administered 2020-10-30: 1 via TOPICAL
  Filled 2020-10-29: qty 56

## 2020-10-29 MED ORDER — LIDOCAINE-EPINEPHRINE (PF) 2 %-1:200000 IJ SOLN
INTRAMUSCULAR | Status: DC | PRN
  Administered 2020-10-29: 5 mL via EPIDURAL

## 2020-10-29 MED ORDER — OXYTOCIN BOLUS FROM INFUSION
333.0000 mL | Freq: Once | INTRAVENOUS | Status: AC
  Administered 2020-10-29: 333 mL via INTRAVENOUS

## 2020-10-29 MED ORDER — DIPHENHYDRAMINE HCL 25 MG PO CAPS: 25.0000 mg | ORAL_CAPSULE | Freq: Four times a day (QID) | ORAL | Status: DC | PRN

## 2020-10-29 MED ORDER — LACTATED RINGERS IV SOLN: INTRAVENOUS | Status: DC

## 2020-10-29 MED ORDER — FENTANYL CITRATE (PF) 100 MCG/2ML IJ SOLN: 50.0000 ug | INTRAMUSCULAR | Status: DC | PRN

## 2020-10-29 MED ORDER — LACTATED RINGERS IV SOLN
500.0000 mL | Freq: Once | INTRAVENOUS | Status: AC
  Administered 2020-10-29: 500 mL via INTRAVENOUS

## 2020-10-29 MED ORDER — SENNOSIDES-DOCUSATE SODIUM 8.6-50 MG PO TABS
2.0000 | ORAL_TABLET | Freq: Every day | ORAL | Status: DC
  Administered 2020-10-30 – 2020-10-31 (×2): 2 via ORAL
  Filled 2020-10-29 (×2): qty 2

## 2020-10-29 MED ORDER — IBUPROFEN 600 MG PO TABS
600.0000 mg | ORAL_TABLET | Freq: Four times a day (QID) | ORAL | Status: DC
  Administered 2020-10-29 – 2020-10-31 (×7): 600 mg via ORAL
  Filled 2020-10-29 (×7): qty 1

## 2020-10-29 MED ORDER — COCONUT OIL OIL: 1.0000 "application " | TOPICAL_OIL | Status: DC | PRN

## 2020-10-29 MED ORDER — FENTANYL-BUPIVACAINE-NACL 0.5-0.125-0.9 MG/250ML-% EP SOLN
12.0000 mL/h | EPIDURAL | Status: DC | PRN
  Administered 2020-10-29: 12 mL/h via EPIDURAL
  Filled 2020-10-29: qty 250

## 2020-10-29 MED ORDER — HYDROXYZINE HCL 50 MG PO TABS
50.0000 mg | ORAL_TABLET | Freq: Four times a day (QID) | ORAL | Status: DC | PRN
  Filled 2020-10-29: qty 1

## 2020-10-29 MED ORDER — DIPHENHYDRAMINE HCL 50 MG/ML IJ SOLN
12.5000 mg | INTRAMUSCULAR | Status: DC | PRN
Start: 2020-10-29 — End: 2020-10-29

## 2020-10-29 MED ORDER — TERBUTALINE SULFATE 1 MG/ML IJ SOLN: 0.2500 mg | Freq: Once | INTRAMUSCULAR | Status: DC | PRN

## 2020-10-29 MED ORDER — ACETAMINOPHEN 325 MG PO TABS
650.0000 mg | ORAL_TABLET | ORAL | Status: DC | PRN
  Administered 2020-10-30 – 2020-10-31 (×6): 650 mg via ORAL
  Filled 2020-10-29 (×6): qty 2

## 2020-10-29 MED ORDER — SIMETHICONE 80 MG PO CHEW: 80.0000 mg | CHEWABLE_TABLET | ORAL | Status: DC | PRN

## 2020-10-29 MED ORDER — ONDANSETRON HCL 4 MG PO TABS: 4.0000 mg | ORAL_TABLET | ORAL | Status: DC | PRN

## 2020-10-29 MED ORDER — NIFEDIPINE 10 MG PO CAPS
10.0000 mg | ORAL_CAPSULE | ORAL | Status: DC | PRN
  Filled 2020-10-29 (×2): qty 1

## 2020-10-29 MED ORDER — TETANUS-DIPHTH-ACELL PERTUSSIS 5-2.5-18.5 LF-MCG/0.5 IM SUSY
0.5000 mL | PREFILLED_SYRINGE | Freq: Once | INTRAMUSCULAR | Status: AC
  Administered 2020-10-31: 0.5 mL via INTRAMUSCULAR
  Filled 2020-10-29: qty 0.5

## 2020-10-29 MED ORDER — FENTANYL CITRATE (PF) 100 MCG/2ML IJ SOLN
INTRAMUSCULAR | Status: AC
  Filled 2020-10-29: qty 2

## 2020-10-29 MED ORDER — SOD CITRATE-CITRIC ACID 500-334 MG/5ML PO SOLN
30.0000 mL | ORAL | Status: DC | PRN
  Administered 2020-10-29: 30 mL via ORAL
  Filled 2020-10-29: qty 30

## 2020-10-29 MED ORDER — LIDOCAINE HCL (PF) 1 % IJ SOLN: 30.0000 mL | INTRAMUSCULAR | Status: DC | PRN

## 2020-10-29 MED ORDER — OXYTOCIN-SODIUM CHLORIDE 30-0.9 UT/500ML-% IV SOLN: 1.0000 m[IU]/min | INTRAVENOUS | Status: DC

## 2020-10-29 MED ORDER — VANCOMYCIN HCL IN DEXTROSE 1-5 GM/200ML-% IV SOLN
1000.0000 mg | Freq: Two times a day (BID) | INTRAVENOUS | Status: DC
  Filled 2020-10-29: qty 200

## 2020-10-29 MED ORDER — FENTANYL CITRATE (PF) 100 MCG/2ML IJ SOLN
INTRAMUSCULAR | Status: DC | PRN
  Administered 2020-10-29: 100 ug via EPIDURAL

## 2020-10-29 MED ORDER — ACETAMINOPHEN 325 MG PO TABS: 650.0000 mg | ORAL_TABLET | ORAL | Status: DC | PRN

## 2020-10-29 MED ORDER — MEASLES, MUMPS & RUBELLA VAC IJ SOLR: 0.5000 mL | Freq: Once | INTRAMUSCULAR | Status: DC

## 2020-10-29 MED ORDER — DIBUCAINE (PERIANAL) 1 % EX OINT: 1.0000 "application " | TOPICAL_OINTMENT | CUTANEOUS | Status: DC | PRN

## 2020-10-29 MED ORDER — ZOLPIDEM TARTRATE 5 MG PO TABS: 5.0000 mg | ORAL_TABLET | Freq: Every evening | ORAL | Status: DC | PRN

## 2020-10-29 MED ORDER — EPHEDRINE 5 MG/ML INJ: 10.0000 mg | INTRAVENOUS | Status: DC | PRN

## 2020-10-29 MED ORDER — LACTATED RINGERS IV SOLN: 500.0000 mL | INTRAVENOUS | Status: DC | PRN

## 2020-10-29 MED ORDER — OXYCODONE-ACETAMINOPHEN 5-325 MG PO TABS: 1.0000 | ORAL_TABLET | ORAL | Status: DC | PRN

## 2020-10-29 MED ORDER — ONDANSETRON HCL 4 MG/2ML IJ SOLN
4.0000 mg | Freq: Four times a day (QID) | INTRAMUSCULAR | Status: DC | PRN
  Administered 2020-10-29: 4 mg via INTRAVENOUS
  Filled 2020-10-29: qty 2

## 2020-10-29 NOTE — Discharge Summary (Signed)
Postpartum Discharge Summary  Date of Service updated 10/31/2020  Patient Name: Rachel Stuart DOB: Aug 03, 1995 MRN: [id number]  Date of admission: 10/29/2020 Delivery date:10/29/2020  Delivering provider: Waldon Merl  Date of discharge: 10/31/2020  Admitting diagnosis: Fetal demise, greater than 22 weeks, antepartum, fetus 2 of multiple gestation [O36.4XX2] Intrauterine pregnancy: [redacted]w[redacted]d    Secondary diagnosis:  Principal Problem:   Vaginal delivery Active Problems:   Obesity during pregnancy   History of postpartum depression   Monochorionic diamniotic twin gestation in third trimester   Late prenatal care   Fetal demise, greater than 22 weeks, antepartum, fetus 2 of multiple gestation  Additional problems: None    Discharge diagnosis: Preterm pregnancy delivered and fetal demise delivered             Post partum procedures: None Augmentation: Pitocin Complications: Twin pregnancy with twin B IUFD  Hospital course: Onset of Labor With Vaginal Delivery      25y.o. yo [id number] admitted in Active Labor on 10/29/2020. Patient had an uncomplicated labor course as follows: presented with SROM, dilated to 5.5 cm, augmented with pitocin and AROM'd just prior to delivery. Twin A delivered without complication, and Twin B delivered shortly after. Membrane Rupture Time/Date: 9:19 AM ,10/29/2020   Delivery Method:Vaginal, Spontaneous  Episiotomy: None  Lacerations:  None  Patient had an uncomplicated postpartum course.  She is ambulating, tolerating a regular diet, passing flatus, and urinating well. Patient is discharged home in stable condition on 10/31/20.  Newborn Data: Birth date:10/29/2020  Birth time:7:08 PM  Gender:Female  Living status:Living  Apgars:9 ,9  Weight:2050 g   Magnesium Sulfate received: No BMZ received: No Rhophylac:N/A MMR:N/A T-DaP:Given prenatally Flu: N/A Transfusion:No  Physical exam  Vitals:   10/30/20 0902 10/30/20 1227 10/30/20 2115  10/31/20 0535  BP: 130/85 119/79 119/72 109/75  Pulse: (!) 48 (!) 51 60 (!) 55  Resp: '18 18 17 16  ' Temp: 98.4 F (36.9 C) 98.6 F (37 C) 98.2 F (36.8 C) 98.2 F (36.8 C)  TempSrc: Oral Oral Oral Oral  SpO2: 100% 98% 100% 99%  Weight:      Height:       General: alert, cooperative, and no distress Lochia: appropriate Uterine Fundus: firm Incision: N/A DVT Evaluation: no LE edema, no calf tenderness   Labs: Lab Results  Component Value Date   WBC 7.5 10/29/2020   HGB 11.5 (L) 10/29/2020   HCT 33.1 (L) 10/29/2020   MCV 87.1 10/29/2020   PLT 253 10/29/2020   CMP Latest Ref Rng & Units 05/02/2020  Glucose 70 - 99 mg/dL 88  BUN 6 - 20 mg/dL <5(L)  Creatinine 0.44 - 1.00 mg/dL 0.66  Sodium 135 - 145 mmol/L 135  Potassium 3.5 - 5.1 mmol/L 3.7  Chloride 98 - 111 mmol/L 103  CO2 22 - 32 mmol/L 21(L)  Calcium 8.9 - 10.3 mg/dL 8.9  Total Protein 6.5 - 8.1 g/dL 6.1(L)  Total Bilirubin 0.3 - 1.2 mg/dL 0.7  Alkaline Phos 38 - 126 U/L 53  AST 15 - 41 U/L 13(L)  ALT 0 - 44 U/L 17   Edinburgh Score: Edinburgh Postnatal Depression Scale Screening Tool 10/30/2020  I have been able to laugh and see the funny side of things. 0  I have looked forward with enjoyment to things. 1  I have blamed myself unnecessarily when things went wrong. 2  I have been anxious or worried for no good reason. 2  I have  felt scared or panicky for no good reason. 1  Things have been getting on top of me. 1  I have been so unhappy that I have had difficulty sleeping. 1  I have felt sad or miserable. 1  I have been so unhappy that I have been crying. 1  The thought of harming myself has occurred to me. 0  Edinburgh Postnatal Depression Scale Total 10   After visit meds:  Allergies as of 10/31/2020       Reactions   Amoxicillin Swelling   Lips swell Has patient had a PCN reaction causing immediate rash, facial/tongue/throat swelling, SOB or lightheadedness with hypotension: yes Has patient had a PCN  reaction causing severe rash involving mucus membranes or skin necrosis: no Has patient had a PCN reaction that required hospitalization: no Has patient had a PCN reaction occurring within the last 10 years: no If all of the above answers are "NO", then may proceed with Cephalosporin use. CAN TAKE KEFLEX        Medication List     STOP taking these medications    famotidine 20 MG tablet Commonly known as: PEPCID   metroNIDAZOLE 500 MG tablet Commonly known as: FLAGYL   NIFEdipine 30 MG 24 hr tablet Commonly known as: ADALAT CC   ondansetron 4 MG disintegrating tablet Commonly known as: Zofran ODT   ranitidine 150 MG capsule Commonly known as: ZANTAC       TAKE these medications    acetaminophen 500 MG tablet Commonly known as: TYLENOL Take 500 mg by mouth every 6 (six) hours as needed for mild pain or headache.   ibuprofen 600 MG tablet Commonly known as: ADVIL Take 1 tablet (600 mg total) by mouth every 6 (six) hours.   prenatal multivitamin Tabs tablet Take 1 tablet by mouth daily at 12 noon.       Discharge home in stable condition Support provided by Chaplain while inpatient. Social work consulted and provided resources for patient. Mood stable at time of discharge. Infant Feeding: Bottle Infant Disposition: Home with mother Discharge instruction: Per After Visit Summary and Postpartum booklet. Activity: Advance as tolerated. Pelvic rest for 6 weeks.  Diet: Routine diet Future Appointments:No future appointments. Follow up Visit:  [place] [facility] [place] [facility] Follow up.   Specialty: Obstetrics and Gynecology Contact information: [place] [address] [phone]               Message sent to schedulers on 10/29/20 by Dr. Thompson Grayer.  Please schedule this patient for a In person postpartum visit in 6 weeks with the following provider: Any  provider. Additional Postpartum F/U: Postpartum Depression checkup in one week to follow up after delivery of fetal demise High risk pregnancy complicated by:  Twin IUFD Delivery mode:  Vaginal, Spontaneous  Anticipated Birth Control:  Nexplanon placed while inpatient  Genia Del, MD OB Fellow, [place] for [place] 10/31/2020 9:55 AM

## 2020-10-29 NOTE — Progress Notes (Signed)
At bedside with Rachel Stuart, I explained that IUFD female seemed farther along than originally observed on ultrasound per delivering provider. We discussed Anora chromosomal miscarriage testing and what it could look for and she would like to do that at this time. Discussed and offered autopsy which she declined at this time. Chaplain was at bedside and she discussed wanting to do cremation, chaplain agreeable to helping her find a funeral home to do this. She also states that she wants to look at her baby at some point, we discussed she can do this at any time and to reach out to the nurse to do so. Discussed with night RN, charge, and Dr Macon Large. Offered emotional support.  Burley Saver MD

## 2020-10-29 NOTE — Progress Notes (Signed)
   10/29/20 2004  Clinical Encounter Type  Visited With Patient;Other (Comment) (Patient holding baby Lajean Saver)  Visit Type Psychological support;Spiritual support  Referral From Nurse  Consult/Referral To Chaplain   Chaplain responded. The patient said she gave birth to twins after 7 pm; however, the baby boy did not survive. I provided emotional and grief support while actively listening as she spoke about not being sure if she wanted to see the baby. I established a rapport and connectedness and advised her I would be present and provide the support she decided to see her baby. She demonstrated strength and acceptance; she stated it's her faith, and God knows best. The patient said she has two other daughters who are 16 and 6 yrs. Old. Her maternal grandmother also lives in the home. She said her parents died of cancer, and her grandmother has pancreatic cancer. The [facility] doctors consulted with the patient, and she decided to have the baby cremated. I provided a Patient Placement card. She will contact [employer] underwriter Service at [address] [place], [place] [address]. This note was prepared by Deneen Harts, M.Div..  For questions please contact by phone [phone].

## 2020-10-29 NOTE — MAU Provider Note (Addendum)
[name] is a 25 y.o. [id number] At [redacted]w[redacted]d here with complaints of abdominal pain that started this morning at 3 am. She denies LOF, decreased fetal movements, vaginal bleeding. She has a high risk pregnancy complicated by Mono-Di twins and a fetal demise of Twin B. She denies other complications in this pregnancy but has had limited prenatal care. Was inpatient on [facility] History     CSN: [id number]  Arrival date and time: 10/29/20 0439   Event Date/Time   First Provider Initiated Contact with Patient 10/29/20 [phone]      No chief complaint on file.  Abdominal Pain This is a new problem. The current episode started today. The problem has been unchanged. The pain is located in the suprapubic region, LLQ, LUQ, RLQ and RUQ. The pain is at a severity of 8/10. Pertinent negatives include no dysuria, fever, frequency, nausea or vomiting.   OB History     Gravida  4   Para  2   Term  2   Preterm  0   AB  1   Living  2      SAB  1   IAB  0   Ectopic  0   Multiple  1   Live Births  2        Obstetric Comments  Pt stated she lost consciousness during labor due to intense pain. After receiving epidural birth went well.          Past Medical History:  Diagnosis Date   Ankle fracture    Anxiety    Depression    zoloft   Obesity    Ovarian cyst     Past Surgical History:  Procedure Laterality Date   NO PAST SURGERIES      Family History  Problem Relation Age of Onset   Hypertension Maternal Grandmother    Diabetes Maternal Grandmother    Stroke Maternal Grandmother    Cancer Maternal Grandmother        uterine   Depression Maternal Grandmother    Diabetes Paternal Grandfather    Depression Mother    Mental illness Mother    Hypertension Mother    Cancer Mother    Cancer Father     Social History   Tobacco Use   Smoking status: Never   Smokeless tobacco: Never  Vaping Use   Vaping Use: Never used  Substance Use Topics   Alcohol use: Yes    Drug use: No    Allergies:  Allergies  Allergen Reactions   Amoxicillin Swelling    Lips swell Has patient had a PCN reaction causing immediate rash, facial/tongue/throat swelling, SOB or lightheadedness with hypotension: yes Has patient had a PCN reaction causing severe rash involving mucus membranes or skin necrosis: no Has patient had a PCN reaction that required hospitalization: no Has patient had a PCN reaction occurring within the last 10 years: no If all of the above answers are "NO", then may proceed with Cephalosporin use. CAN TAKE KEFLEX     Medications Prior to Admission  Medication Sig Dispense Refill Last Dose   acetaminophen (TYLENOL) 500 MG tablet Take 500 mg by mouth every 6 (six) hours as needed for mild pain or headache.    10/28/2020   ranitidine (ZANTAC) 150 MG capsule Take 150 mg by mouth 2 (two) times daily.   10/28/2020   famotidine (PEPCID) 20 MG tablet Take 20 mg by mouth daily.      metroNIDAZOLE (FLAGYL) 500 MG tablet Take  1 tablet (500 mg total) by mouth every 12 (twelve) hours. (Patient not taking: Reported on 10/17/2020) 12 tablet 0    NIFEdipine (ADALAT CC) 30 MG 24 hr tablet Take 1 tablet (30 mg total) by mouth 2 (two) times daily. (Patient not taking: Reported on 10/04/2020) 60 tablet 1    ondansetron (ZOFRAN ODT) 4 MG disintegrating tablet Take 1 tablet (4 mg total) by mouth every 8 (eight) hours as needed for nausea or vomiting. (Patient not taking: Reported on 10/03/2020) 15 tablet 0     Review of Systems  Constitutional: Negative.  Negative for fever.  HENT: Negative.    Respiratory: Negative.    Cardiovascular: Negative.   Gastrointestinal:  Positive for abdominal pain. Negative for nausea and vomiting.  Genitourinary:  Negative for dysuria and frequency.  Neurological: Negative.   Physical Exam   Blood pressure 129/78, pulse 60, temperature 98.7 F (37.1 C), temperature source Oral, resp. rate 18, height 5\' 3"  (1.6 m), weight 80.5 kg, last  menstrual period 02/12/2020, SpO2 98 %, unknown if currently breastfeeding.  Physical Exam Constitutional:      Appearance: Normal appearance.  Cardiovascular:     Rate and Rhythm: Normal rate.  Pulmonary:     Effort: Pulmonary effort is normal.  Abdominal:     General: Abdomen is flat.  Genitourinary:    General: Normal vulva.     Vagina: No vaginal discharge.  Skin:    General: Skin is warm and dry.  Neurological:     General: No focal deficit present.     Mental Status: She is alert.    MAU Course  Procedures  MDM -will try IV and procardia, will also draw admit labs and COVID  -patient cervix was 3/60/-2 at 0515 and again at 0630.  -patient BP too low for procardia so will instead give IV fluid bolus and discuss with daytime for disposition  Patient care endorsed to 02/14/2020; will recheck. Dr. Judeth Horn planning to admit patient based on cervical change.   Assessment and Plan    Crissie Reese [facility] 10/29/2020, 6:34 AM

## 2020-10-29 NOTE — Anesthesia Preprocedure Evaluation (Signed)
Anesthesia Evaluation  Patient identified by MRN, date of birth, ID band Patient awake    Reviewed: Allergy & Precautions, H&P , Patient's Chart, lab work & pertinent test results  Airway Mallampati: I  TM Distance: >3 FB Neck ROM: full    Dental no notable dental hx.    Pulmonary neg pulmonary ROS,    Pulmonary exam normal        Cardiovascular negative cardio ROS Normal cardiovascular exam     Neuro/Psych negative neurological ROS     GI/Hepatic negative GI ROS, Neg liver ROS,   Endo/Other  negative endocrine ROS  Renal/GU negative Renal ROS     Musculoskeletal   Abdominal (+) + obese,   Peds  Hematology negative hematology ROS (+)   Anesthesia Other Findings   Reproductive/Obstetrics (+) Pregnancy                             Anesthesia Physical  Anesthesia Plan  ASA: II  Anesthesia Plan: Epidural   Post-op Pain Management:    Induction:   PONV Risk Score and Plan:   Airway Management Planned:   Additional Equipment:   Intra-op Plan:   Post-operative Plan:   Informed Consent: I have reviewed the patients History and Physical, chart, labs and discussed the procedure including the risks, benefits and alternatives for the proposed anesthesia with the patient or authorized representative who has indicated his/her understanding and acceptance.       Plan Discussed with:   Anesthesia Plan Comments:         Anesthesia Quick Evaluation

## 2020-10-29 NOTE — Progress Notes (Signed)
Patient delivered viable female infant at 84 and an IUFD female at 21. IUFD fetus was unknown in gestational age at time of demise. On assessment, fetus seems further along than initially observed on ultrasound. Patient unaware of findings and declined seeing or holding fetus  at this time. Bonding appropriately with live infant. MD notified to situation and will come discuss with patient further. Night Shift RN, Lupe Carney, notifying Chaplain at this time.

## 2020-10-29 NOTE — Anesthesia Procedure Notes (Signed)
Epidural Patient location during procedure: OB Start time: 10/29/2020 11:26 AM End time: 10/29/2020 11:40 AM  Staffing Anesthesiologist: Lowella Curb, MD Performed: anesthesiologist   Preanesthetic Checklist Completed: patient identified, IV checked, site marked, risks and benefits discussed, surgical consent, monitors and equipment checked, pre-op evaluation and timeout performed  Epidural Patient position: sitting Prep: ChloraPrep Patient monitoring: heart rate, cardiac monitor, continuous pulse ox and blood pressure Approach: midline Location: L2-L3 Injection technique: LOR saline  Needle:  Needle type: Tuohy  Needle gauge: 17 G Needle length: 9 cm Needle insertion depth: 5 cm Catheter type: closed end flexible Catheter size: 20 Guage Catheter at skin depth: 9 cm Test dose: negative  Assessment Events: blood not aspirated, injection not painful, no injection resistance, no paresthesia and negative IV test  Additional Notes Reason for block:procedure for pain

## 2020-10-29 NOTE — H&P (Addendum)
OBSTETRIC ADMISSION HISTORY AND PHYSICAL  Rachel Stuart is a 25 y.o. female [phone] with IUP at [redacted]w[redacted]d by 10wk [place] presenting for SROM. She started having contractions last night. She reports +FMs, no LOF, no VB, no blurry vision, headaches or peripheral edema, and RUQ pain.  She plans on bottle feeding. She request inpatient Nexplanon for birth control. She received her prenatal [facility] at [facility]  Dating: By 10wk [place] --->  Estimated Date of Delivery: 11/27/20  Sono:   @[redacted]w[redacted]d , CWD, normal anatomy Twin A, IUFD Twin B, Cephalic presentation, Lower fetus lie, Posterior Placenta with Dividing membrane, 2377g, 23% EFW   Prenatal History/Complications:  -Mo-Di TIUP with fetal demise of Twin B -Preterm labor in third trimester without delivery: patient received tocolysis with magnesium sulfate and procardia and completed betamethasone -Obesity -Late prenatal care  Past Medical History: Past Medical History:  Diagnosis Date   Ankle fracture    Anxiety    Depression    zoloft   Obesity    Ovarian cyst     Past Surgical History: Past Surgical History:  Procedure Laterality Date   NO PAST SURGERIES      Obstetrical History: OB History     Gravida  4   Para  2   Term  2   Preterm  0   AB  1   Living  2      SAB  1   IAB  0   Ectopic  0   Multiple  1   Live Births  2        Obstetric Comments  Pt stated she lost consciousness during labor due to intense pain. After receiving epidural birth went well.          Social History Social History   Socioeconomic History   Marital status: Single    Spouse name: Not on file   Number of children: Not on file   Years of education: Not on file   Highest education level: Not on file  Occupational History   Not on file  Tobacco Use   Smoking status: Never   Smokeless tobacco: Never  Vaping Use   Vaping Use: Never used  Substance and Sexual Activity   Alcohol use: Yes   Drug use: No   Sexual activity: Yes     Birth control/protection: None  Other Topics Concern   Not on file  Social History Narrative   Not on file   Social Determinants of Health   Financial Resource Strain: Not on file  Food Insecurity: Not on file  Transportation Needs: Not on file  Physical Activity: Not on file  Stress: Not on file  Social Connections: Not on file    Family History: Family History  Problem Relation Age of Onset   Hypertension Maternal Grandmother    Diabetes Maternal Grandmother    Stroke Maternal Grandmother    Cancer Maternal Grandmother        uterine   Depression Maternal Grandmother    Diabetes Paternal Grandfather    Depression Mother    Mental illness Mother    Hypertension Mother    Cancer Mother    Cancer Father     Allergies: Allergies  Allergen Reactions   Amoxicillin Swelling    Lips swell Has patient had a PCN reaction causing immediate rash, facial/tongue/throat swelling, SOB or lightheadedness with hypotension: yes Has patient had a PCN reaction causing severe rash involving mucus membranes or skin necrosis: no Has patient had a  PCN reaction that required hospitalization: no Has patient had a PCN reaction occurring within the last 10 years: no If all of the above answers are "NO", then may proceed with Cephalosporin use. CAN TAKE KEFLEX     Medications Prior to Admission  Medication Sig Dispense Refill Last Dose   acetaminophen (TYLENOL) 500 MG tablet Take 500 mg by mouth every 6 (six) hours as needed for mild pain or headache.    10/28/2020   ranitidine (ZANTAC) 150 MG capsule Take 150 mg by mouth 2 (two) times daily.   10/28/2020   famotidine (PEPCID) 20 MG tablet Take 20 mg by mouth daily.      metroNIDAZOLE (FLAGYL) 500 MG tablet Take 1 tablet (500 mg total) by mouth every 12 (twelve) hours. (Patient not taking: Reported on 10/17/2020) 12 tablet 0    NIFEdipine (ADALAT CC) 30 MG 24 hr tablet Take 1 tablet (30 mg total) by mouth 2 (two) times daily. (Patient not  taking: Reported on 10/04/2020) 60 tablet 1    ondansetron (ZOFRAN ODT) 4 MG disintegrating tablet Take 1 tablet (4 mg total) by mouth every 8 (eight) hours as needed for nausea or vomiting. (Patient not taking: Reported on 10/03/2020) 15 tablet 0      Review of Systems   All systems reviewed and negative except as stated in HPI  Blood pressure (!) 104/53, pulse (!) 46, temperature 97.6 F (36.4 C), temperature source Axillary, resp. rate 14, height 5\' 3"  (1.6 m), weight 80.5 kg, last menstrual period 02/12/2020, SpO2 100 %, unknown if currently breastfeeding. General appearance: alert, cooperative, and appears stated age Lungs: clear to auscultation bilaterally Heart: regular rate and rhythm Abdomen: soft, non-tender; bowel sounds normal Pelvic: 5-6/70/-2 Extremities: Homans sign is negative, no sign of DVT Presentation: cephalic by SVE Fetal monitoringBaseline: 135 bpm, Variability: Good {> 6 bpm), Accelerations: Reactive, and Decelerations: Absent Uterine activity: irregular Dilation: 5.5 Effacement (%): 70 Station: -2 Exam by:: S Nix RN   Prenatal labs: ABO, Rh: --/--/A POS (08/01 0645) Antibody: NEG (08/01 0645) Rubella: 3.01 (07/02 1650) RPR: NON REACTIVE (08/01 0636)  HBsAg: NON REACTIVE (07/02 1650)  HIV: Non Reactive (07/02 1604)  GBS: Negative/-- (07/02 0000)  1 hr Glucola : 70, 110, 87 Genetic screening : Low Risk Anatomy 06-30-1987: Twin A normal, Twin B IUFD  Prenatal Transfer Tool  Maternal Diabetes: No Genetic Screening: Declined Maternal Ultrasounds/Referrals: Other:IUFD Twin B Fetal Ultrasounds or other Referrals:  Referred to Materal Fetal Medicine  Maternal Substance Abuse:  No Significant Maternal Medications:  None Significant Maternal Lab Results: Group B Strep negative  Results for orders placed or performed during the hospital encounter of 10/29/20 (from the past 24 hour(s))  RPR   Collection Time: 10/29/20  6:36 AM  Result Value Ref Range   RPR Ser Ql  NON REACTIVE NON REACTIVE  CBC   Collection Time: 10/29/20  6:36 AM  Result Value Ref Range   WBC 7.5 4.0 - 10.5 K/uL   RBC 3.80 (L) 3.87 - 5.11 MIL/uL   Hemoglobin 11.5 (L) 12.0 - 15.0 g/dL   HCT 12/29/20 (L) 43.3 - 29.5 %   MCV 87.1 80.0 - 100.0 fL   MCH 30.3 26.0 - 34.0 pg   MCHC 34.7 30.0 - 36.0 g/dL   RDW 18.8 41.6 - 60.6 %   Platelets 253 150 - 400 K/uL   nRBC 0.0 0.0 - 0.2 %  Resp Panel by RT-PCR (Flu A&B, Covid) Nasopharyngeal Swab   Collection Time: 10/29/20  6:45 AM   Specimen: Nasopharyngeal Swab; Nasopharyngeal(NP) swabs in vial transport medium  Result Value Ref Range   SARS Coronavirus 2 by RT PCR NEGATIVE NEGATIVE   Influenza A by PCR NEGATIVE NEGATIVE   Influenza B by PCR NEGATIVE NEGATIVE  Type and screen [facility]   Collection Time: 10/29/20  6:45 AM  Result Value Ref Range   ABO/RH(D) A POS    Antibody Screen NEG    Sample Expiration      11/01/2020,2359 Performed at [facility], 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 [id number] at [redacted]w[redacted]d here for Preterm Rupture of membranes in early labor.  #Labor: SROM clear fluid, expectant management as patient is making change but will start  Pitocin as needed #Pain:  Epidural as needed #FWB:  Cat 1 Twin A, Twin B IUFD #ID:   GBS negative, collected 7/4 so within appropriate window #MOF:  Bottle, mom endorses some bloody nipple discharge so does not want to try #MOC:  IP Nexplanon #Circ:    Yes #IUFD:  IUFD twin B, prepare for stillbirth delivery after delivery of Twin A  Nelson Chimes, MD  10/29/2020, 12:34 PM  GME ATTESTATION:  I saw and evaluated the patient. I agree with the findings and the plan of care as documented in the resident's note.  Evalina Field, MD OB Fellow, Faculty [facility], [facility] 10/29/2020 3:47 PM

## 2020-10-29 NOTE — Progress Notes (Signed)
Early Ord is a 25 y.o. [id number] at [redacted]w[redacted]d by [place] who presents for SROM.   Subjective: Doing well. No concerns at this time.  Objective: BP 100/69   Pulse (!) 48   Temp (!) 97.2 F (36.2 C) (Axillary)   Resp 14   Ht 5\' 3"  (1.6 m)   Wt 80.5 kg   LMP 02/12/2020   SpO2 100%   BMI 31.44 kg/m  No intake/output data recorded. No intake/output data recorded.  FHT:  FHR: Baseline 135 bpm, variability: moderate,  accelerations:  Present,  decelerations:  Absent UC:   Every 6-7 minutes SVE:   Dilation: 5.5 Effacement (%): 70 Station: -2 Exam by:: S Nix RN  Labs: Lab Results  Component Value Date   WBC 7.5 10/29/2020   HGB 11.5 (L) 10/29/2020   HCT 33.1 (L) 10/29/2020   MCV 87.1 10/29/2020   PLT 253 10/29/2020    Assessment / Plan: Mykhia Danish is a 25 y.o. 34 at [redacted]w[redacted]d by [redacted]w[redacted]d who presents for SROM.   Labor:  Progressing though contraction pattern has spaced out. Small amount of residual membranes noted on fetal head, suspect forebag. Will start Pitcocin and consider AROM at next check once fetal head is more engaged in the pelvis. Fetal Wellbeing:  Category I Pain Control:  Epidural I/D:   GBS negative Anticipated MOD:  NSVD  [place], MD Obstetrics Fellow 10/29/2020, 3:07 PM

## 2020-10-29 NOTE — MAU Note (Signed)
Pt presents to MAU c/o ctx increasing in intensity since 0300 am.  Endorses +fm of baby A, denies LOF or vaginal bleeding.  States that she recently found out that twin B is a fetal demise, no other complications with the pregnancy per pt.

## 2020-10-30 ENCOUNTER — Inpatient Hospital Stay (HOSPITAL_COMMUNITY): Payer: Medicaid Other

## 2020-10-30 ENCOUNTER — Inpatient Hospital Stay (HOSPITAL_COMMUNITY)
Admission: AD | Admit: 2020-10-30 | Payer: Medicaid Other | Source: Home / Self Care | Admitting: Obstetrics & Gynecology

## 2020-10-30 DIAGNOSIS — Z30017 Encounter for initial prescription of implantable subdermal contraceptive: Secondary | ICD-10-CM

## 2020-10-30 MED ORDER — ETONOGESTREL 68 MG ~~LOC~~ IMPL
68.0000 mg | DRUG_IMPLANT | Freq: Once | SUBCUTANEOUS | Status: AC
  Administered 2020-10-30: 68 mg via SUBCUTANEOUS
  Filled 2020-10-30: qty 1

## 2020-10-30 MED ORDER — LIDOCAINE HCL 1 % IJ SOLN
0.0000 mL | Freq: Once | INTRAMUSCULAR | Status: AC | PRN
  Administered 2020-10-30: 20 mL via INTRADERMAL
  Filled 2020-10-30: qty 20

## 2020-10-30 NOTE — Procedures (Signed)
Post-Placental Nexplanon Insertion Procedure Note  Patient was identified. Informed consent was signed, signed copy in chart. A time-out was performed.    The insertion site was identified 8-10 cm (3-4 inches) from the medial epicondyle of the humerus and 3-5 cm (1.25-2 inches) posterior to (below) the sulcus (groove) between the biceps and triceps muscles of the patient's R arm and marked. The site was prepped and draped in the usual sterile fashion. Pt was prepped with alcohol swab and then injected with 6 cc of 1% lidocaine. The site was prepped with betadine. Nexplanon removed form packaging,  Device confirmed in needle, then inserted full length of needle and withdrawn per handbook instructions. Provider and patient verified presence of the implant in the woman's arm by palpation. Pt insertion site was covered with steristrips/adhesive bandage and pressure bandage. There was minimal blood loss. Patient tolerated procedure well.  Patient was given post procedure instructions and Nexplanon user card with expiration date. Condoms were recommended for STI prevention. Patient was asked to keep the pressure dressing on for 24 hours to minimize bruising and keep the adhesive bandage on for 3-5 days. The patient verbalized understanding of the plan of care and agrees.   Expiration Date 12/30/2024  Shirlean Mylar, MD [facility] Res[facility], PGY-3

## 2020-10-30 NOTE — Progress Notes (Signed)
POSTPARTUM PROGRESS NOTE  Post Partum Day 1  Subjective:  Ivet Guerrieri is a 25 y.o. [id number] s/p SVD viable female Twin A at [redacted]w[redacted]d and IUFD Twin B.  She reports she is doing okay. She is feeling a little depressed after seeing her other son yesterday but is taking comfort from her baby at bedside. No acute events overnight. She denies any problems with ambulating, voiding or po intake. Denies nausea or vomiting.  Pain is moderately controlled.  Lochia is mild. She is amenable to social work seeing her and appreciated the chaplain visit yesterday  Objective: Blood pressure 116/86, pulse (!) 50, temperature 98.3 F (36.8 C), temperature source Oral, resp. rate 16, height 5\' 3"  (1.6 m), weight 80.5 kg, last menstrual period 02/12/2020, SpO2 100 %, unknown if currently breastfeeding.  Physical Exam:  General: alert, cooperative and no distress Chest: no respiratory distress Heart:regular rate, distal pulses intact Abdomen: soft, nontender,  Uterine Fundus: firm, appropriately tender DVT Evaluation: No calf swelling or tenderness Extremities: no edema Skin: warm, dry  Recent Labs    10/29/20 0636  HGB 11.5*  HCT 33.1*    Assessment/Plan: Willadene Mounsey is a 25 y.o. 34 s/p SVD at [redacted]w[redacted]d   PPD#1 - Doing well  Routine postpartum care Grief - appropriate after delivery of IUFD, chaplain has seen patient and will get social work to see as well to help with resources outside the hospital Contraception: Inpatient nexplanon Feeding: bottle Dispo: Plan for discharge PPD2.   LOS: 1 day   [redacted]w[redacted]d MD  10/30/2020, 7:49 AM

## 2020-10-30 NOTE — Clinical Social Work Maternal (Addendum)
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  Address:  [address] [place] [address]    Phone number:  [phone] (home)     Additional phone number:   Household Members/Support Persons (HM/SP):   Household Member/Support Person 1, Household Member/Support Person 2, Household Member/Support Person 3, Household Member/Support Person 4   HM/SP Name Relationship DOB or Age  HM/SP -1 Rachel Stuart FOB 05-13-1989  HM/SP -2 Rachel Stuart Grandmother 78  HM/SP -3 Rachel Stuart Daughter 02-26-2010  HM/SP -4 Rachel Stuart Daughter 02-15-2015  HM/SP -5        HM/SP -6        HM/SP -7        HM/SP -8          Natural Supports (not living in the home):  Friends, Immediate Family   Professional Supports:     Employment: Unemployed   Type of Work:     Education:  9 to 11 years   Homebound arranged: No  Financial Resources:      Other Resources:      Cultural/Religious Considerations Which May Impact Care:    Strengths:  Ability to meet basic needs  , Home prepared for child  , Pediatrician chosen   Psychotropic Medications:         Pediatrician:    [place] area  Pediatrician List:   [place] [facility] ([address])  [place]    [place]    [place]    [place]    [place]      Pediatrician Fax Number:    Risk Factors/Current Problems:      Cognitive State:  Able to Concentrate  , Alert  , Insightful  , Linear Thinking     Mood/Affect:  Calm  ,  Relaxed  , Comfortable     CSW Assessment: CSW received consult for Late Prenatal Care, IUFD, and, 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  CSW met with MOB to offer support and complete assessment.    CSW met with MOB at bedside. CSW introduced role and congratulated MOB. MOB observed lying in the bed awake and holding the infant. MOB presented calm, smiling and engaged with CSW. CSW confirm MOB demographic information and household members. MOB reported she lives with FOB, her grandmother and two daughters (see chart above). CSW inquired how MOB has felt since giving birth. MOB expressed, "doing pretty good. I had a fast delivery." MOB disclosed, " I felt depressed because I lost one of the twins. I found out at my appointment and initially felt like it was my fault. I wished I could have done something differently, to prevent it from happening. I also felt anxious about the induction date and delivery process." MOB shared that her grandmother was helpful and emotionally supportive throughout her pregnancy. MOB disclosed, "My grandmother helped me to process the loss and help me to realize that God does things for a reason." MOB shared that FOB has been supportive and her sister   is in town to help care for the newborn and her older children. CSW provided active listening and validated MOB emotions and offered support. CSW educated MOB about grief counseling and provided MOB with a list of resources in the area. MOB was very appreciative of the information provided. She shared that her mother passed away last year from cancer and that she has lost other relatives from cancer and feels grief counseling will help her process their losses as well. MOB reported she was diagnosed with depression and anxiety after her father was in incarcerated in 2008. MOB reported she was in therapy in 2008 through 2012 and then restarted therapy in 2014 through 2020. MOB reported she took the medication Zoloft for  her anxiety and depression symptoms for 1.5 years, which helped her symptoms. CSW inquired if MOB experienced postpartum depression. MOB reported she experienced postpartum depression in 2016. She disclosed she experienced a poor appetite that lasted for 4 days and eventually subsided. CSW provided education regarding the baby blues period vs. perinatal mood disorders, discussed treatment and gave resources for mental health follow up. CSW also recommended MOB complete a self-evaluation during the postpartum time period using the New Mom Checklist from Postpartum Progress and encouraged MOB to contact a medical professional if symptoms are noted at any time. MOB reported since [facility] she feels comfortable reaching out to the office if concerns. MOB has also considered medication management if concerns arise. CSW inquired about MOB coping mechanisms. MOB reported she listens to music and enjoy talking with her daughter and grandmother. CSW encouraged MOB to using her coping mechanism during this time. CSW assessed MOB for safety. MOB denied thoughts of harm to self and others.   CSW inquired about MOB limited prenatal care. MOB reported she received prenatal care at [redacted]w[redacted]d because she had issues with setting up Medicaid. MOB reported a friend helped her apply for Medicaid and food stamps. CSW asked MOB if she has WIC services. MOB asked about information to apply. CSW provided MOB with WIC contact information to apply. MOB very appreciative.  CSW educated MOB about the hospital drug screen policy as it pertains to LPN and infant UDS resulting positive for THC. MOB disclosed denied using THC during the pregnancy and states she was around FOB who uses THC. CSW inquired about CPS history. MOB reported CPS history in 2016, after her daughter UDS tested positive for THC. MOB reported no current open cases. CSW made MOB CSW will made a report to CPS. MOB reported understanding and  had no questions. CSW provided review of Sudden Infant Death Syndrome (SIDS) precautions. MOB reported the infant will sleep in a bassinet. She has essential items for the infant including a car seat, diapers and wipes. MOB has chosen [facility] for infants follow up care. MOB reported she has reliable transportation to appointments. CSW assessed MOB additional needs. MOB reported no additional supports.   CSW made a report to [place] CPS.   CSW identifies no further need for intervention and no barriers to discharge at this time.     CSW Plan/Description:  Perinatal Mood and Anxiety Disorder (PMADs) Education, Hospital Drug Screen Policy Information, CSW Will Continue to Monitor Umbilical Cord Tissue Drug Screen Results and Make Report if Warranted, Sudden Infant Death Syndrome (SIDS) Education, Other Information/Referral to Community Resources, No Further Intervention Required/No Barriers to Discharge    Elizibeth Breau A Callen Vancuren, LCSW 10/30/2020, 12:03 PM 

## 2020-10-30 NOTE — Anesthesia Postprocedure Evaluation (Signed)
Anesthesia Post Note  Patient: [name]  Procedure(s) Performed: AN AD HOC LABOR EPIDURAL     Patient location during evaluation: Mother Baby Anesthesia Type: Epidural Level of consciousness: awake and alert Pain management: pain level controlled Vital Signs Assessment: post-procedure vital signs reviewed and stable Respiratory status: spontaneous breathing, nonlabored ventilation and respiratory function stable Cardiovascular status: stable Postop Assessment: no headache, no backache and epidural receding Anesthetic complications: no   No notable events documented.  Last Vitals:  Vitals:   10/29/20 2356 10/30/20 0441  BP: 129/83 116/86  Pulse: 62 (!) 50  Resp: 16 16  Temp: 36.8 C 36.8 C  SpO2: 99% 100%    Last Pain:  Vitals:   10/30/20 0715  TempSrc:   PainSc: 0-No pain   Pain Goal:                   Rachel Stuart

## 2020-10-30 NOTE — Lactation Note (Addendum)
This note was copied from a baby's chart. Lactation Consultation Note  Patient Name: Rachel Stuart [id number] Date: 10/30/2020   Age:27 hours  Mother states she wants to continue formula feeding.  She states at one time she was asked about pumping since baby was having difficulty taking a bottle of formula but since SLP has given mother a slower nipple and baby is doing better.  Mother states during pregnancy she noticed blood coming out of R nipple.  Had mother hand express and mother asked what was that coming out.  LC explained that was colostrum with no blood at this time. Suggest she tell her OB/GYN about the previous episode of blood. .  Consult Status Consult Status: Complete (mother declined follow up)    Hardie Pulley 10/30/2020, 11:51 AM

## 2020-10-31 MED ORDER — PRENATAL MULTIVITAMIN CH: 1.0000 | ORAL_TABLET | Freq: Every day | ORAL | Status: DC

## 2020-10-31 MED ORDER — IBUPROFEN 600 MG PO TABS: 600.0000 mg | ORAL_TABLET | Freq: Four times a day (QID) | ORAL | 0 refills | Status: DC

## 2020-10-31 NOTE — Social Work (Signed)
CSW received consult due to score 10 on Edinburgh Depression Screen.     CSW met with MOB on 8/2, provided education regarding Baby Blues vs PMADs and provided MOB with resources for mental health follow up. CSW encouraged MOB to evaluate her mental health throughout the postpartum period with the use of the New Mom Checklist developed by Postpartum Progress as well as the [place] Postnatal Depression Scale and notify a medical professional if symptoms arise.  MOB was receptive to resources provided and states she feels comfortable reaching out to her physician if concerns arise.   Kathrin Greathouse, MSW, LCSW [facility] Worker  [phone] 10/31/2020  8:01 AM

## 2020-11-02 ENCOUNTER — Encounter: Payer: Medicaid Other | Admitting: Obstetrics and Gynecology

## 2020-11-02 LAB — SURGICAL PATHOLOGY

## 2020-11-10 ENCOUNTER — Telehealth (HOSPITAL_COMMUNITY): Payer: Self-pay

## 2020-11-10 NOTE — Telephone Encounter (Signed)
"  I'm doing ok." Patient has no questions or concerns about her healing.  "He's good. He is eating and gaining weight well. He sleeps in a bassinet." RN reviewed ABC's of safe sleep with patient. Patient declines any questions or concerns about baby.  EPDS score is 4.   Marcelino Duster [facility] 11/10/2020,1509

## 2020-11-26 NOTE — BH Specialist Note (Signed)
Integrated Behavioral Health via Telemedicine Visit  11/26/2020 Rachel Stuart [id number]  Number of Integrated Behavioral Health visits: 1 Session Start time: 9:25  Session End time: 10:42 Total time:  17  Referring Provider: Myna Hidalgo, DO Patient/Family location: Home [facility] Provider location: [facility]  All persons participating in visit: [name] and [facility] Rachel Stuart   Types of Service: Individual psychotherapy and Video visit  I connected with [facility] and/or Rachel Stuart's  n/a  via  Telephone or Engineer, civil (consulting)  (Video is Caregility application) and verified that I am speaking with the correct person using two identifiers. Discussed confidentiality: Yes   I discussed the limitations of telemedicine and the availability of in person appointments.  Discussed there is a possibility of technology failure and discussed alternative modes of communication if that failure occurs.  I discussed that engaging in this telemedicine visit, they consent to the provision of behavioral healthcare and the services will be billed under their insurance.  Patient and/or legal guardian expressed understanding and consented to Telemedicine visit: Yes   Presenting Concerns: Patient and/or family reports the following symptoms/concerns: Pt states her primary symptom is fatigue, waking every 1-1.5 hours to feed baby, but feels manageable; family is very supportive;  no other questions or concerns at this time.  Duration of problem: Postpartum; Severity of problem: mild  Patient and/or Family's Strengths/Protective Factors: Social and Emotional competence, Concrete supports in place (healthy food, safe environments, etc.), and Sense of purpose  Goals Addressed: Patient will:  Demonstrate ability to: Increase healthy adjustment to current life circumstances and continue healthy grieving over  loss  Progress towards Goals: Ongoing  Interventions: Interventions utilized:  Supportive Counseling Standardized Assessments completed: Not Needed  Patient and/or Family Response: Pt agrees with treatment plan  Assessment: Patient currently experiencing Grief.   Patient may benefit from psychoeducation and brief therapeutic interventions regarding coping with normal grieving and adjustment .  Plan: Follow up with behavioral health clinician on : Call Rachel Stuart as needed at [phone] Behavioral recommendations:  -Consider registering for new mom and/or loss support group at www.[email or web address].net -Consider using apps (on After Visit Summary) as discussed -Continue prioritizing healthy self-care daily Referral(s): Integrated Art gallery manager (In Clinic) and [employer]:  online support groups  I discussed the assessment and treatment plan with the patient and/or parent/guardian. They were provided an opportunity to ask questions and all were answered. They agreed with the plan and demonstrated an understanding of the instructions.   They were advised to call back or seek an in-person evaluation if the symptoms worsen or if the condition fails to improve as anticipated.  Rachel Close Klint Lezcano, LCSW  Edinburgh Postnatal Depression Scale Screening Tool 11/10/2020 10/30/2020  I have been able to laugh and see the funny side of things. 0 0  I have looked forward with enjoyment to things. 0 1  I have blamed myself unnecessarily when things went wrong. 1 2  I have been anxious or worried for no good reason. 0 2  I have felt scared or panicky for no good reason. 0 1  Things have been getting on top of me. 1 1  I have been so unhappy that I have had difficulty sleeping. 0 1  I have felt sad or miserable. 1 1  I have been so unhappy that I have been crying. 1 1  The thought of harming myself has occurred to me. 0 0  [place]  Postnatal Depression Scale Total 4 10

## 2020-11-27 ENCOUNTER — Ambulatory Visit (INDEPENDENT_AMBULATORY_CARE_PROVIDER_SITE_OTHER): Payer: Medicaid Other | Admitting: Clinical

## 2020-11-27 DIAGNOSIS — F4321 Adjustment disorder with depressed mood: Secondary | ICD-10-CM

## 2020-11-27 NOTE — Patient Instructions (Signed)
[facility] at [facility] [address] [place], [place] [address] [phone] (main office) [phone] (Canio Winokur's office)  /Emotional Wellbeing Apps and Websites Here are a few free apps meant to help you to help yourself.  To find, try searching on the internet to see if the app is offered on Apple/Android devices. If your first choice doesn't come up on your device, the good news is that there are many choices! Play around with different apps to see which ones are helpful to you.    Calm This is an app meant to help increase calm feelings. Includes info, strategies, and tools for tracking your feelings.      Calm Harm  This app is meant to help with self-harm. Provides many 5-minute or 15-min coping strategies for doing instead of hurting yourself.       Healthy Minds Health Minds is a problem-solving tool to help deal with emotions and cope with stress you encounter wherever you are.      MindShift This app can help people cope with anxiety. Rather than trying to avoid anxiety, you can make an important shift and face it.      MY3  MY3 features a support system, safety plan and resources with the goal of offering a tool to use in a time of need.       My Life My Voice  This mood journal offers a simple solution for tracking your thoughts, feelings and moods. Animated emoticons can help identify your mood.       Relax Melodies Designed to help with sleep, on this app you can mix sounds and meditations for relaxation.      Smiling Mind Smiling Mind is meditation made easy: it's a simple tool that helps put a smile on your mind.        Stop, Breathe & Think  A friendly, simple guide for people through meditations for mindfulness and compassion.  Stop, Breathe and Think Kids Enter your current feelings and choose a "mission" to help you cope. Offers videos for certain moods instead of just sound recordings.       Team  Orange The goal of this tool is to help teens change how they think, act, and react. This app helps you focus on your own good feelings and experiences.      The Virtual Hope Box The Virtual Hope Box (VHB) contains simple tools to help patients with coping, relaxation, distraction, and positive thinking.     

## 2020-12-04 ENCOUNTER — Telehealth: Payer: Self-pay

## 2020-12-04 DIAGNOSIS — R519 Headache, unspecified: Secondary | ICD-10-CM

## 2020-12-04 DIAGNOSIS — M549 Dorsalgia, unspecified: Secondary | ICD-10-CM

## 2020-12-04 NOTE — Telephone Encounter (Addendum)
VM left on nurse line by [facility] RN who called to notify us that patient is complaining of back pain that radiates up neck and headache. Pain is 8/10 on pain scale. Patient is concerned this is from Nexplanon that was inserted following delivery. RN took BP at home: 120/82. States pt denied dizziness, blurry vision, no swelling on assessment.   Called pt on 12/05/20. Pt reports continued headache with back and neck pain. Reports taking 500 mg Tylenol x3 tablets with no improvement. Explained to pt that she should not take any more than 2 Tylenol 500 mg every 6 hours. Pt able to take BP at home; BP is 129/69. Reviewed with Crissie Reese, MD who gives verbal order for 600 mg ibuprofen every 6 hours and Flexeril 5 mg TID as needed. Encouraged pt to rotate Tylenol and ibuprofen every 3 hours and take Flexeril as needed. Pt states she would like Nexplanon taken out because she did not have headaches until after this was placed. Explained that this can be done at [facility] An appt later this month.

## 2020-12-05 MED ORDER — IBUPROFEN 600 MG PO TABS: 600.0000 mg | ORAL_TABLET | Freq: Four times a day (QID) | ORAL | 0 refills | Status: DC | PRN

## 2020-12-05 MED ORDER — CYCLOBENZAPRINE HCL 5 MG PO TABS
5.0000 mg | ORAL_TABLET | Freq: Three times a day (TID) | ORAL | 0 refills | Status: DC | PRN
Start: 2020-12-05 — End: 2021-01-03

## 2020-12-05 NOTE — Addendum Note (Signed)
Addended by: Maxwell Marion E on: 12/05/2020 03:08 PM   Modules accepted: Orders

## 2020-12-17 NOTE — Telephone Encounter (Signed)
Call received from [facility] RN to ask if we followed up with patient. I explained that I have followed up with patient and she has not expressed any further concerns.

## 2020-12-18 ENCOUNTER — Ambulatory Visit: Payer: Medicaid Other | Admitting: Family Medicine

## 2021-01-03 ENCOUNTER — Ambulatory Visit (INDEPENDENT_AMBULATORY_CARE_PROVIDER_SITE_OTHER): Payer: Medicaid Other | Admitting: Obstetrics & Gynecology

## 2021-01-03 ENCOUNTER — Encounter: Payer: Self-pay | Admitting: Obstetrics & Gynecology

## 2021-01-03 ENCOUNTER — Other Ambulatory Visit: Payer: Self-pay

## 2021-01-03 DIAGNOSIS — M549 Dorsalgia, unspecified: Secondary | ICD-10-CM | POA: Diagnosis not present

## 2021-01-03 MED ORDER — CYCLOBENZAPRINE HCL 10 MG PO TABS: 10.0000 mg | ORAL_TABLET | Freq: Three times a day (TID) | ORAL | 3 refills | Status: DC | PRN

## 2021-01-03 NOTE — Progress Notes (Signed)
Post Partum Visit Note  Rachel Stuart is a 25 y.o. [phone] female who presents for a postpartum visit. She is 9 weeks postpartum following a normal spontaneous vaginal delivery.  I have fully reviewed the prenatal and intrapartum course. The delivery was at [redacted]w[redacted]d gestational weeks.  Anesthesia: epidural. Postpartum course has been uncomplicated. Baby is doing well. Baby is feeding by bottle - Similac Neosure. Bleeding thin lochia. Bowel function is normal. Bladder function is normal. Patient is not sexually active. Contraception method is Nexplanon, placed postpartum. Postpartum depression screening: negative.  She reports back pain, helped a little by Flexeril and pain medications. Needs refill of Flexeril.  The pregnancy intention screening data noted above was reviewed. Potential methods of contraception were discussed. The patient elected to proceed with No data recorded.   Edinburgh Postnatal Depression Scale - 01/03/21 1600       Edinburgh Postnatal Depression Scale:  In the Past 7 Days   I have been able to laugh and see the funny side of things. 0    I have looked forward with enjoyment to things. 0    I have blamed myself unnecessarily when things went wrong. 1    I have been anxious or worried for no good reason. 0    I have felt scared or panicky for no good reason. 0    Things have been getting on top of me. 0    I have been so unhappy that I have had difficulty sleeping. 0    I have felt sad or miserable. 0    I have been so unhappy that I have been crying. 0    The thought of harming myself has occurred to me. 0    Edinburgh Postnatal Depression Scale Total 1            Health Maintenance Due  Topic Date Due   Hepatitis C Screening  Never done   COVID-19 Vaccine (3 - Booster for Moderna series) 01/20/2020   INFLUENZA VACCINE  Never done    The following portions of the patient's history were reviewed and updated as appropriate: allergies, current medications,  past family history, past medical history, past social history, past surgical history, and problem list.  Review of Systems Pertinent items noted in HPI and remainder of comprehensive ROS otherwise negative.  Objective:  BP 116/78   Pulse 86   Wt 180 lb 9.6 oz (81.9 kg)   LMP 02/12/2020   Breastfeeding No   BMI 31.99 kg/m    General:  alert and no distress   Breasts:  not indicated  Lungs: clear to auscultation bilaterally  Heart:  regular rate and rhythm  Abdomen: soft, non-tender; bowel sounds normal; no masses,  no organomegaly   GU exam:  not indicated       Assessment:   Normal postpartum exam.   Plan:   Essential components of care per ACOG recommendations:  1.  Mood and well being: Patient with negative depression screening today. Reviewed local resources for support.  - Patient tobacco use? No.   - hx of drug use? No.    2. Infant care and feeding:  -Patient currently breastmilk feeding? No.  -Social determinants of health (SDOH) reviewed in EPIC. No concerns.  3. Sexuality, contraception and birth spacing - Patient does not want a pregnancy in the next year.   - Has Nexplanon in place, will stay in place for 3 years.  4. Sleep and fatigue -Encouraged family/partner/community support of 4  hrs of uninterrupted sleep to help with mood and fatigue  5. Physical Recovery  - Discussed patients delivery and complications. She describes her labor as good. - Patient had a Vaginal, no problems at delivery. Patient had no laceration. Perineal healing reviewed. Patient expressed understanding - Patient has urinary incontinence? No. - Patient is safe to resume physical and sexual activity  6.  Health Maintenance - HM due items addressed Yes - Last pap smear  Diagnosis  Date Value Ref Range Status  09/26/2020   Final   - Negative for intraepithelial lesion or malignancy (NILM)  -Breast Cancer screening indicated? No.   7. Back pain - Continue pain medications as  needed, Flexeril refilled - May need referral to PT if continues/worsens.  Jaynie Collins, MD [facility] [employer]

## 2021-07-27 ENCOUNTER — Encounter (HOSPITAL_COMMUNITY): Payer: Self-pay | Admitting: Emergency Medicine

## 2021-07-27 ENCOUNTER — Other Ambulatory Visit: Payer: Self-pay

## 2021-07-27 ENCOUNTER — Emergency Department (HOSPITAL_COMMUNITY): Payer: Medicaid Other

## 2021-07-27 ENCOUNTER — Emergency Department (HOSPITAL_COMMUNITY)
Admission: EM | Admit: 2021-07-27 | Discharge: 2021-07-27 | Disposition: A | Discharge status: Home / Self Care | Payer: Medicaid Other | Attending: Emergency Medicine | Admitting: Emergency Medicine

## 2021-07-27 DIAGNOSIS — R1032 Left lower quadrant pain: Secondary | ICD-10-CM | POA: Insufficient documentation

## 2021-07-27 DIAGNOSIS — M549 Dorsalgia, unspecified: Secondary | ICD-10-CM | POA: Diagnosis present

## 2021-07-27 DIAGNOSIS — N12 Tubulo-interstitial nephritis, not specified as acute or chronic: Secondary | ICD-10-CM | POA: Diagnosis not present

## 2021-07-27 LAB — URINALYSIS, ROUTINE W REFLEX MICROSCOPIC
Glucose, UA: NEGATIVE mg/dL
Hgb urine dipstick: NEGATIVE
Ketones, ur: NEGATIVE mg/dL
Nitrite: POSITIVE — AB
Protein, ur: 30 mg/dL — AB
Specific Gravity, Urine: 1.033 — ABNORMAL HIGH (ref 1.005–1.030)
WBC, UA: >50 WBC/hpf — ABNORMAL HIGH (ref 0–5)
pH: 5 (ref 5.0–8.0)

## 2021-07-27 LAB — I-STAT BETA HCG BLOOD, ED (MC, WL, AP ONLY): I-stat hCG, quantitative: <5 m[IU]/mL (ref <5)

## 2021-07-27 LAB — CBC
HCT: 40.5 % (ref 36.0–46.0)
Hemoglobin: 13.5 g/dL (ref 12.0–15.0)
MCH: 28 pg (ref 26.0–34.0)
MCHC: 33.3 g/dL (ref 30.0–36.0)
MCV: 83.9 fL (ref 80.0–100.0)
Platelets: 342 10*3/uL (ref 150–400)
RBC: 4.83 MIL/uL (ref 3.87–5.11)
RDW: 13.3 % (ref 11.5–15.5)
WBC: 14.4 10*3/uL — ABNORMAL HIGH (ref 4.0–10.5)
nRBC: 0 % (ref 0.0–0.2)

## 2021-07-27 LAB — BASIC METABOLIC PANEL
Anion gap: 7 (ref 5–15)
BUN: 8 mg/dL (ref 6–20)
CO2: 23 mmol/L (ref 22–32)
Calcium: 9.2 mg/dL (ref 8.9–10.3)
Chloride: 109 mmol/L (ref 98–111)
Creatinine, Ser: 0.81 mg/dL (ref 0.44–1.00)
GFR, Estimated: >60 mL/min (ref >60)
Glucose, Bld: 126 mg/dL — ABNORMAL HIGH (ref 70–99)
Potassium: 3.1 mmol/L — ABNORMAL LOW (ref 3.5–5.1)
Sodium: 139 mmol/L (ref 135–145)

## 2021-07-27 MED ORDER — SODIUM CHLORIDE 0.9 % IV SOLN
1.0000 g | Freq: Once | INTRAVENOUS | Status: AC
  Administered 2021-07-27: 1 g via INTRAVENOUS
  Filled 2021-07-27: qty 10

## 2021-07-27 MED ORDER — ONDANSETRON HCL 4 MG/2ML IJ SOLN
4.0000 mg | Freq: Once | INTRAMUSCULAR | Status: AC
  Administered 2021-07-27: 4 mg via INTRAVENOUS
  Filled 2021-07-27: qty 2

## 2021-07-27 MED ORDER — HYDROCODONE-ACETAMINOPHEN 5-325 MG PO TABS: 1.0000 | ORAL_TABLET | ORAL | 0 refills | Status: DC | PRN

## 2021-07-27 MED ORDER — IBUPROFEN 600 MG PO TABS: 600.0000 mg | ORAL_TABLET | Freq: Four times a day (QID) | ORAL | 0 refills | Status: DC | PRN

## 2021-07-27 MED ORDER — ONDANSETRON HCL 4 MG PO TABS: 4.0000 mg | ORAL_TABLET | ORAL | 0 refills | Status: DC | PRN

## 2021-07-27 MED ORDER — POTASSIUM CHLORIDE CRYS ER 20 MEQ PO TBCR
40.0000 meq | EXTENDED_RELEASE_TABLET | Freq: Once | ORAL | Status: AC
  Administered 2021-07-27: 40 meq via ORAL
  Filled 2021-07-27: qty 2

## 2021-07-27 MED ORDER — IBUPROFEN 400 MG PO TABS
400.0000 mg | ORAL_TABLET | Freq: Once | ORAL | Status: AC | PRN
  Administered 2021-07-27: 400 mg via ORAL
  Filled 2021-07-27: qty 1

## 2021-07-27 MED ORDER — KETOROLAC TROMETHAMINE 30 MG/ML IJ SOLN
30.0000 mg | Freq: Once | INTRAMUSCULAR | Status: AC
  Administered 2021-07-27: 30 mg via INTRAVENOUS
  Filled 2021-07-27: qty 1

## 2021-07-27 MED ORDER — CEPHALEXIN 500 MG PO CAPS: 1000.0000 mg | ORAL_CAPSULE | Freq: Two times a day (BID) | ORAL | 0 refills | Status: AC

## 2021-07-27 MED ORDER — MAGNESIUM OXIDE -MG SUPPLEMENT 400 (240 MG) MG PO TABS
400.0000 mg | ORAL_TABLET | Freq: Once | ORAL | Status: AC
  Administered 2021-07-27: 400 mg via ORAL
  Filled 2021-07-27: qty 1

## 2021-07-27 MED ORDER — SODIUM CHLORIDE 0.9 % IV BOLUS
1000.0000 mL | Freq: Once | INTRAVENOUS | Status: AC
  Administered 2021-07-27: 1000 mL via INTRAVENOUS

## 2021-07-27 MED ORDER — MORPHINE SULFATE (PF) 4 MG/ML IV SOLN
4.0000 mg | Freq: Once | INTRAVENOUS | Status: AC
  Administered 2021-07-27: 4 mg via INTRAVENOUS
  Filled 2021-07-27: qty 1

## 2021-07-27 NOTE — ED Triage Notes (Signed)
Pt brought to ED triage via wheelchair by [facility] with c/o mid-back pain that radiates to both sides that began this morning and progressively worsened throughout the day. Pt states she has tried OTC pain medication and heat packs with no relief in symptoms. Denies any injury to back. Denies any recent heavy lifting.  ?

## 2021-07-27 NOTE — Discharge Instructions (Signed)
It was a pleasure caring for you today in the emergency department. ° °Please return to the emergency department for any worsening or worrisome symptoms. ° ° °

## 2021-07-27 NOTE — ED Provider Notes (Signed)
?[facility] EMERGENCY DEPARTMENT ?Provider Note ? ? ?CSN: [id number] ?Arrival date & time: 07/27/21  0058 ? ?  ? ?History ? ?Chief Complaint  ?Patient presents with  ? Back Pain  ? ? ?Rachel Stuart is a 26 y.o. female. ? ? Patient as above with significant medical history as below, including depression, anxiety, obesity who presents to the ED with complaint of left-sided flank pain.  Onset yesterday.  Pain is sharp, stabbing, radiating to her left lower quadrant.  Having nausea, no vomiting.  Reduced p.o. intake last 24 hours secondary to discomfort.  She is having mild dysuria.  No hematuria.  No abnormal vaginal bleeding or discharge.  No fevers or chills.  No trauma.  No history of kidney stones. ? ?Patient reports similar episode few months ago which resolved spontaneously.  She did not seek health care evaluation at that time. ? ? ?Past Medical History: ?No date: Ankle fracture ?No date: Anxiety ?No date: Depression ?    Comment:  zoloft ?No date: Obesity ?No date: Ovarian cyst ? ?Past Surgical History: ?No date: NO PAST SURGERIES  ? ? ?The history is provided by the patient and a relative. No language interpreter was used.  ?Back Pain ?Associated symptoms: abdominal pain and dysuria   ?Associated symptoms: no chest pain, no fever and no headaches   ? ?  ? ?Home Medications ?Prior to Admission medications   ?Medication Sig Start Date End Date Taking? Authorizing Provider  ?cephALEXin (KEFLEX) 500 MG capsule Take 2 capsules (1,000 mg total) by mouth 2 (two) times daily for 14 days. 07/27/21 08/10/21 Yes Sloan Leiter, DO  ?HYDROcodone-acetaminophen (NORCO/VICODIN) 5-325 MG tablet Take 1 tablet by mouth every 4 (four) hours as needed. 07/27/21  Yes Sloan Leiter, DO  ?ondansetron (ZOFRAN) 4 MG tablet Take 1 tablet (4 mg total) by mouth every 4 (four) hours as needed for nausea or vomiting. 07/27/21  Yes Tanda Rockers A, DO  ?acetaminophen (TYLENOL) 500 MG tablet Take 500 mg by mouth every 6 (six)  hours as needed for mild pain or headache.     [provider]  ?cyclobenzaprine (FLEXERIL) 10 MG tablet Take 1 tablet (10 mg total) by mouth 3 (three) times daily as needed for muscle spasms. 01/03/21   Anyanwu, Jethro Bastos, MD  ?ibuprofen (ADVIL) 600 MG tablet Take 1 tablet (600 mg total) by mouth every 6 (six) hours as needed. 12/05/20   Venora Maples, MD  ?Prenatal Vit-Fe Fumarate-FA (PRENATAL MULTIVITAMIN) TABS tablet Take 1 tablet by mouth daily at 12 noon. 10/31/20   Worthy Rancher, MD  ?   ? ?Allergies    ?Amoxicillin   ? ?Review of Systems   ?Review of Systems  ?Constitutional:  Negative for chills and fever.  ?HENT:  Negative for facial swelling and trouble swallowing.   ?Eyes:  Negative for photophobia and visual disturbance.  ?Respiratory:  Negative for cough and shortness of breath.   ?Cardiovascular:  Negative for chest pain and palpitations.  ?Gastrointestinal:  Positive for abdominal pain and nausea. Negative for vomiting.  ?Endocrine: Negative for polydipsia and polyuria.  ?Genitourinary:  Positive for dysuria and flank pain. Negative for difficulty urinating and hematuria.  ?Musculoskeletal:  Negative for gait problem and joint swelling.  ?Skin:  Negative for pallor and rash.  ?Neurological:  Negative for syncope and headaches.  ?Psychiatric/Behavioral:  Negative for agitation and confusion.   ? ?Physical Exam ?Updated Vital Signs ?BP 139/88   Pulse 77   Temp  98.6 ?F (37 ?C) (Oral)   Resp 17   SpO2 100%  ?Physical Exam ?Vitals and nursing note reviewed.  ?Constitutional:   ?   General: She is not in acute distress. ?   Appearance: Normal appearance. She is obese.  ?HENT:  ?   Head: Normocephalic and atraumatic.  ?   Right Ear: External ear normal.  ?   Left Ear: External ear normal.  ?   Nose: Nose normal.  ?   Mouth/Throat:  ?   Mouth: Mucous membranes are moist.  ?Eyes:  ?   General: No scleral icterus.    ?   Right eye: No discharge.     ?   Left eye: No discharge.   ?Cardiovascular:  ?   Rate and Rhythm: Normal rate and regular rhythm.  ?   Pulses: Normal pulses.  ?   Heart sounds: Normal heart sounds.  ?Pulmonary:  ?   Effort: Pulmonary effort is normal. No respiratory distress.  ?   Breath sounds: Normal breath sounds.  ?Abdominal:  ?   General: Abdomen is flat.  ?   Tenderness: There is abdominal tenderness in the left lower quadrant. There is right CVA tenderness. There is no guarding or rebound.  ? ? ?Musculoskeletal:     ?   General: Normal range of motion.  ?   Cervical back: Normal range of motion.  ?   Right lower leg: No edema.  ?   Left lower leg: No edema.  ?Skin: ?   General: Skin is warm and dry.  ?   Capillary Refill: Capillary refill takes less than 2 seconds.  ?Neurological:  ?   Mental Status: She is alert.  ?Psychiatric:     ?   Mood and Affect: Mood normal.     ?   Behavior: Behavior normal.  ? ? ?ED Results / Procedures / Treatments   ?Labs ?(all labs ordered are listed, but only abnormal results are displayed) ?Labs Reviewed  ?URINALYSIS, ROUTINE W REFLEX MICROSCOPIC - Abnormal; Notable for the following components:  ?    Result Value  ? APPearance CLOUDY (*)   ? Specific Gravity, Urine 1.033 (*)   ? Bilirubin Urine MODERATE (*)   ? Protein, ur 30 (*)   ? Nitrite POSITIVE (*)   ? Leukocytes,Ua LARGE (*)   ? WBC, UA >50 (*)   ? Bacteria, UA MANY (*)   ? All other components within normal limits  ?BASIC METABOLIC PANEL - Abnormal; Notable for the following components:  ? Potassium 3.1 (*)   ? Glucose, Bld 126 (*)   ? All other components within normal limits  ?CBC - Abnormal; Notable for the following components:  ? WBC 14.4 (*)   ? All other components within normal limits  ?URINE CULTURE  ?I-STAT BETA HCG BLOOD, ED ([facility], [facility], [facility] ONLY)  ? ? ?EKG ?None ? ?Radiology ?CT Renal Stone Study ? ?Result Date: 07/27/2021 ?CLINICAL DATA:  Flank pain, kidney stone suspected EXAM: CT ABDOMEN AND PELVIS WITHOUT CONTRAST TECHNIQUE: Multidetector CT imaging of the abdomen  and pelvis was performed following the standard protocol without IV contrast. RADIATION DOSE REDUCTION: This exam was performed according to the departmental dose-optimization program which includes automated exposure control, adjustment of the mA and/or kV according to patient size and/or use of iterative reconstruction technique. COMPARISON:  2020 FINDINGS: Lower chest: Mild bibasilar atelectasis/scarring. Hepatobiliary: No focal liver abnormality is seen. No gallstones, gallbladder wall thickening, or biliary dilatation. Pancreas: Unremarkable.  Spleen: Unremarkable. Adrenals/Urinary Tract: Adrenals are unremarkable. No renal calculi or hydronephrosis. Bladder is unremarkable for degree of distension. Minimal non dependent air is presumably due to instrumentation. Stomach/Bowel: Stomach is within normal limits. Bowel is normal in caliber. Normal appendix. Vascular/Lymphatic: No significant vascular abnormality on this noncontrast study. No enlarged nodes. Reproductive: Uterus and bilateral adnexa are unremarkable. Other: No free fluid.  Small fat containing umbilical hernia. 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 IMPRESSION: No acute abnormality. Electronically Signed   By: Guadlupe Spanish M.D.   On: 07/27/2021 10:23   ? ?Procedures ?Procedures  ? ? ?Medications Ordered in ED ?Medications  ?ibuprofen (ADVIL) tablet 400 mg (400 mg Oral Given 07/27/21 0416)  ?cefTRIAXone (ROCEPHIN) 1 g in sodium chloride 0.9 % 100 mL IVPB (0 g Intravenous Stopped 07/27/21 0945)  ?potassium chloride SA (KLOR-CON M) CR tablet 40 mEq (40 mEq Oral Given 07/27/21 0914)  ?magnesium oxide (MAG-OX) tablet 400 mg (400 mg Oral Given 07/27/21 0914)  ?ondansetron [facility]) injection 4 mg (4 mg Intravenous Given 07/27/21 0925)  ?morphine (PF) 4 MG/ML injection 4 mg (4 mg Intravenous Given 07/27/21 0927)  ?sodium chloride 0.9 % bolus 1,000 mL (0 mLs  Intravenous Stopped 07/27/21 1039)  ?ketorolac (TORADOL) 30 MG/ML injection 30 mg (30 mg Intravenous Given 07/27/21 1049)  ? ? ?ED Course/ Medical Decision Making/ A&P ?  ?                        ?Medical Decision Making ?Amount a

## 2021-07-29 LAB — URINE CULTURE: Culture: >=100000 — AB

## 2021-07-30 ENCOUNTER — Telehealth: Payer: Self-pay | Admitting: Emergency Medicine

## 2021-07-30 NOTE — Telephone Encounter (Signed)
Post ED Visit - Positive Culture Follow-up ? ?Culture report reviewed by antimicrobial stewardship pharmacist: ?[place] Team ?[]  Elenor Quinones, Pharm.D. ?[]  Heide Guile, Pharm.D., BCPS AQ-ID ?[]  Parks Neptune, Pharm.D., BCPS ?[]  Alycia Rossetti, Pharm.D., BCPS ?[]  [place], Pharm.D., BCPS, AAHIVP ?[]  Legrand Como, Pharm.D., BCPS, AAHIVP ?[]  Salome Arnt, PharmD, BCPS ?[]  Johnnette Gourd, PharmD, BCPS ?[]  Hughes Better, PharmD, BCPS ?[]  Leeroy Cha, PharmD ?[]  Laqueta Linden, PharmD, BCPS ?[]  Albertina Parr, PharmD ? ?[place] Team ?[]  Leodis Sias, PharmD ?[]  Lindell Spar, PharmD ?[]  Royetta Asal, PharmD ?[]  Graylin Shiver, Rph ?[]  Rema Fendt) Glennon Mac, PharmD ?[]  Arlyn Dunning, PharmD ?[]  Netta Cedars, PharmD ?[]  Dia Sitter, PharmD ?[]  Leone Haven, PharmD ?[]  Gretta Arab, PharmD ?[]  Theodis Shove, PharmD ?[]  Peggyann Juba, PharmD ?[]  Reuel Boom, PharmD ? ? ?Positive urine culture ?Treated with kelfex, organism sensitive to the same and no further patient follow-up is required at this time. ? ?Hazle Nordmann ?07/30/2021, 8:40 AM ?  ?

## 2021-10-18 ENCOUNTER — Ambulatory Visit (HOSPITAL_COMMUNITY)
Admission: EM | Admit: 2021-10-18 | Discharge: 2021-10-18 | Disposition: A | Discharge status: Home / Self Care | Payer: Medicaid Other

## 2021-10-18 ENCOUNTER — Ambulatory Visit: Payer: Self-pay | Admitting: *Deleted

## 2021-10-18 DIAGNOSIS — L918 Other hypertrophic disorders of the skin: Secondary | ICD-10-CM | POA: Diagnosis not present

## 2021-10-18 NOTE — Discharge Instructions (Addendum)
The area to your back is a skin tag, the bleeding is occurring as there is a small opening at the top most likely from being pulled all  You may cleanse daily with your normal hygiene and apply Neosporin or similar product until area has scabbed over to prevent infection  Once area has closed you do not need to continue any more treatment and allow area to be that will not cause any complications  If you begin to see increased swelling, increased pain, puslike drainage or begin to have fever or chills please return for evaluation for infection  The only treatment for skin tag is for it to be cut off which is typically completed by dermatology and advised some primary doctors, at any point if you decide that you want removal you will need to establish care with 1 of these physicians

## 2021-10-18 NOTE — Telephone Encounter (Signed)
  Chief Complaint: tick attached Symptoms: can not get to tick to remove- on back Frequency: at least in place since yesterday Pertinent Negatives: Patient denies   Disposition: [] ED /[x] Urgent Care (no appt availability in office) / [] Appointment(In office/virtual)/ []  [facility]/ [] Home Care/ [] Refused Recommended Disposition /[] [facility]/ []  Follow-up with PCP Additional Notes: No one in the home to remove- children only    Reason for Disposition  Can't remove live tick (after trying Care Advice)  Answer Assessment - Initial Assessment Questions 1. ATTACHED:  "Is the tick still on the skin?"  (e.g., yes, no, unsure)     yes 2. ONSET - TICK STILL ATTACHED:  "How long do you think the tick has been on your skin?" (e.g., hours, days, unsure)  Note:  Is there a recent activity (camping, hiking) where the caller may have been exposed?     Unsure- outside yesterday  3. ONSET - TICK NOT STILL ATTACHED: "If the tick has been removed, how long do you think the tick was attached before you removed it?" (e.g., 5 hours, 2 days). "When was this?"       4. LOCATION: "Where is the tick bite located?" (e.g., arm, leg)     Middle of back- R of spine 5. TYPE of TICK: "Is it a wood tick or a deer tick?" (e.g., deer tick, wood tick; unsure)     unsure 6. SIZE of TICK: "How big is the tick?" (e.g., size of poppy seed, apple seed, watermelon seed; unsure) Note: Deer ticks can be the size of a poppy seed (nymph) or an apple seed (adult).       watermelon 7. ENGORGED: "Did the tick look flat or engorged (full, swollen)?" (e.g., flat, engorged; unsure)     slightly 8. OTHER SYMPTOMS: "Do you have any other symptoms?" (e.g., fever, rash, redness at bite area, red ring around bite)     Area is red 9. PREGNANCY: "Is there any chance you are pregnant?" "When was your last menstrual period?"     No- implant  Protocols used: Tick Bite-A-AH

## 2021-10-18 NOTE — ED Provider Notes (Signed)
[facility]    CSN: [id number] Arrival date & time: 10/18/21  1627      History   Chief Complaint Chief Complaint  Patient presents with   Tick Removal    HPI Rachel Stuart is a 26 y.o. female.   Patient presents with tick to the bite.  Unable to reach site to attempt to remove self, attempted to get family member to remove but endorses that her sister would not pull it completely off. Site bleeding.   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 After receiving epidural birth went well.           Home Medications    Prior to Admission medications   Medication Sig Start Date End Date Taking? Authorizing Provider  acetaminophen (TYLENOL) 500 MG tablet Take 500 mg by mouth every 6 (six) hours as needed for mild pain or headache.     [provider]  cyclobenzaprine (FLEXERIL) 10 MG tablet Take 1 tablet (10 mg total) by mouth 3 (three) times daily as needed for muscle spasms. 01/03/21   Anyanwu, Jethro Bastos, MD  HYDROcodone-acetaminophen  (NORCO/VICODIN) 5-325 MG tablet Take 1 tablet by mouth every 4 (four) hours as needed. 07/27/21   Sloan Leiter, DO  ibuprofen (ADVIL) 600 MG tablet Take 1 tablet (600 mg total) by mouth every 6 (six) hours as needed. 12/05/20   Venora Maples, MD  ibuprofen (ADVIL) 600 MG tablet Take 1 tablet (600 mg total) by mouth every 6 (six) hours as needed. 07/27/21   Sloan Leiter, DO  ondansetron (ZOFRAN) 4 MG tablet Take 1 tablet (4 mg total) by mouth every 4 (four) hours as needed for nausea or vomiting. 07/27/21   Sloan Leiter, DO  Prenatal Vit-Fe Fumarate-FA (PRENATAL MULTIVITAMIN) TABS tablet Take 1 tablet by mouth daily at 12 noon. 10/31/20   Worthy Rancher, MD    Family History Family History  Problem Relation Age of Onset   Hypertension Maternal Grandmother    Diabetes Maternal Grandmother    Stroke Maternal Grandmother    Cancer Maternal Grandmother        uterine   Depression Maternal Grandmother    Diabetes Paternal Grandfather    Depression Mother  Mental illness Mother    Hypertension Mother    Cancer Mother    Cancer Father     Social History Social History   Tobacco Use   Smoking status: Never   Smokeless tobacco: Never  Vaping Use   Vaping Use: Never used  Substance Use Topics   Alcohol use: Yes   Drug use: No     Allergies   Amoxicillin   Review of Systems Review of Systems  Constitutional: Negative.   Respiratory: Negative.    Cardiovascular: Negative.   Skin:  Positive for wound. Negative for color change, pallor and rash.  Neurological: Negative.      Physical Exam Triage Vital Signs ED Triage Vitals [10/18/21 1721]  Enc Vitals Group     BP 136/81     Pulse Rate 88     Resp 18     Temp 98.3 F (36.8 C)     Temp Source Oral     SpO2 96 %     Weight      Height      Head Circumference      Peak Flow      Pain Score      Pain Loc      Pain Edu?      Excl. in GC?    No data found.  Updated Vital Signs BP 136/81 (BP Location:  Left Arm)   Pulse 88   Temp 98.3 F (36.8 C) (Oral)   Resp 18   SpO2 96%   Visual Acuity Right Eye Distance:   Left Eye Distance:   Bilateral Distance:    Right Eye Near:   Left Eye Near:    Bilateral Near:     Physical Exam Constitutional:      Appearance: Normal appearance.  HENT:     Head: Normocephalic.  Eyes:     Extraocular Movements: Extraocular movements intact.  Skin:    Comments: Skin tag present to the right thoracic region of back, no tick is present nor is an insect bite present, scant bleeding to the site of the skin tag due to puncture to the center  Neurological:     Mental Status: She is alert and oriented to person, place, and time. Mental status is at baseline.  Psychiatric:        Mood and Affect: Mood normal.        Behavior: Behavior normal.      UC Treatments / Results  Labs (all labs ordered are listed, but only abnormal results are displayed) Labs Reviewed - No data to display  EKG   Radiology No results found.  Procedures Procedures (including critical care time)  Medications Ordered in UC Medications - No data to display  Initial Impression / Assessment and Plan / UC Course  I have reviewed the triage vital signs and the nursing notes.  Pertinent labs & imaging results that were available during my care of the patient were reviewed by me and considered in my medical decision making (see chart for details).  Skin tag  Area in which patient believes to be an insect was actually a skin tag which is why it was difficult to remove, bleeding is present at the site as there is a puncture to the center most likely from attempted removal, discussed with patient and showed her site on her telephone, advised patient to not attempt further removal and tocleanse daily with diluted soapy water and apply Neosporin cream until scabbed to prevent infection, given  strict precautions for signs of infection to return to urgent care for  reevaluation Final Clinical Impressions(s) / UC Diagnoses   Final diagnoses:  Skin tag     Discharge Instructions      The area to your back is a skin tag, the bleeding is occurring as there is a small opening at the top most likely from being pulled all  You may cleanse daily with your normal hygiene and apply Neosporin or similar product until area has scabbed over to prevent infection  Once area has closed you do not need to continue any more treatment and allow area to be that will not cause any complications  If you begin to see increased swelling, increased pain, puslike drainage or begin to have fever or chills please return for evaluation for infection  The only treatment for skin tag is for it to be cut off which is typically completed by dermatology and advised some primary doctors, at any point if you decide that you want removal you will need to establish care with 1 of these physicians   ED Prescriptions   None    PDMP not reviewed this encounter.   Valinda Hoar, NP 10/18/21 1747

## 2021-10-18 NOTE — ED Triage Notes (Signed)
Pt reports a tick is on her lower back. She noticed the tick this morning.

## 2022-05-07 IMAGING — US US OB COMP LESS 14 WK
1 series · 15 of 28 positions shown · non-contrast
Comparison: None.

CLINICAL DATA: Abdominal cramping in first trimester of pregnancy;
LMP 02/11/2021

EXAM:
TWIN OBSTETRICAL ULTRASOUND <14 WKS

[Series 1: us ob comp less 14 wk · 15 of 49 slices shown]
[im 1/49]
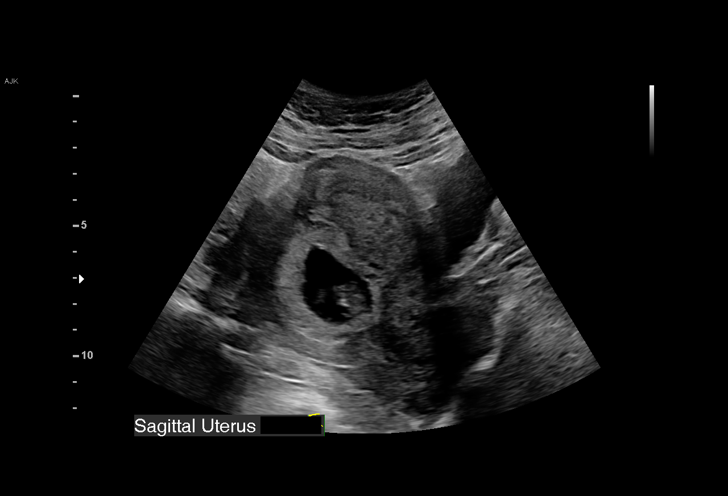
[im 4/49]
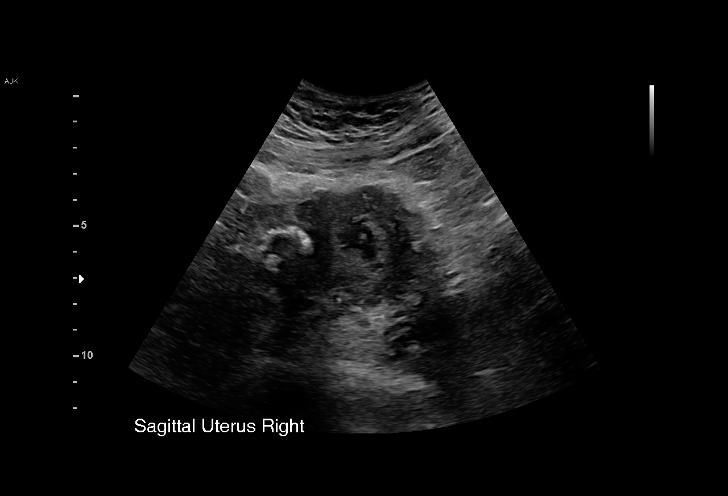
[im 8/49]
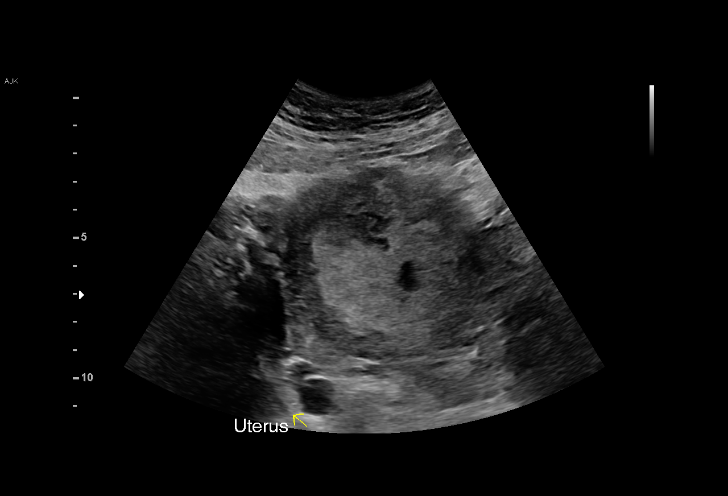
[im 11/49]
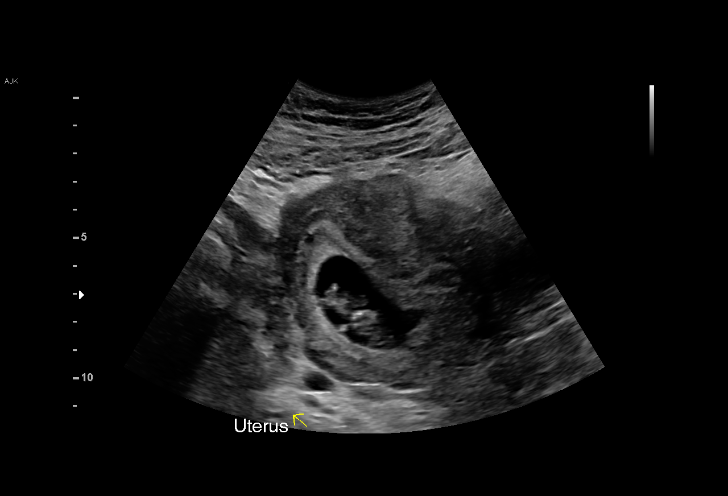
[im 15/49]
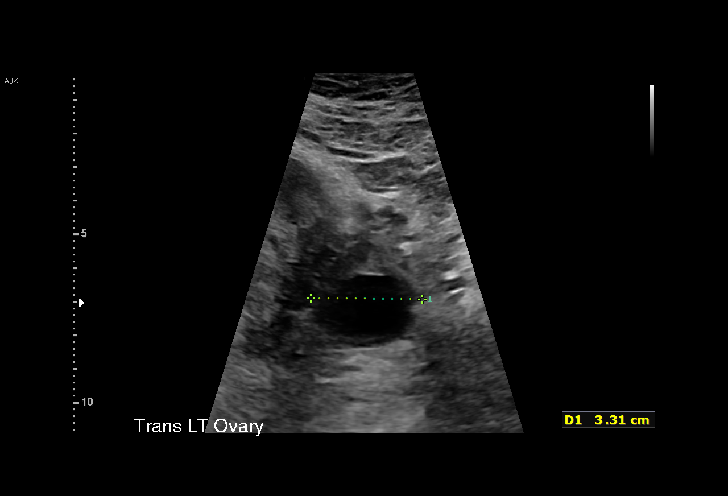
[im 18/49]
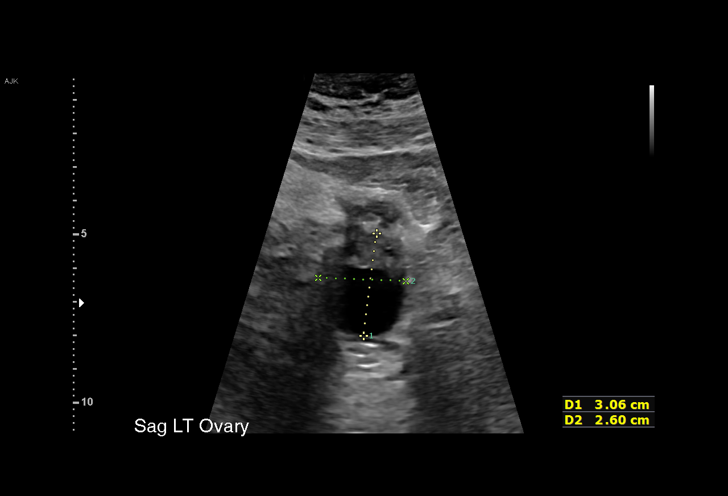
[im 22/49]
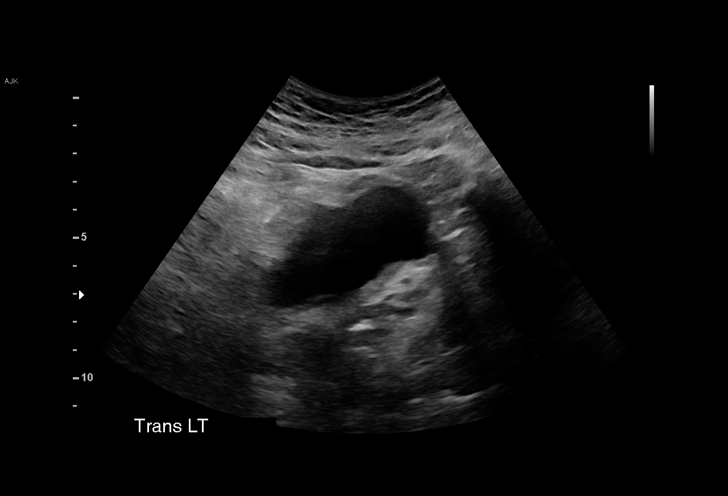
[im 25/49]
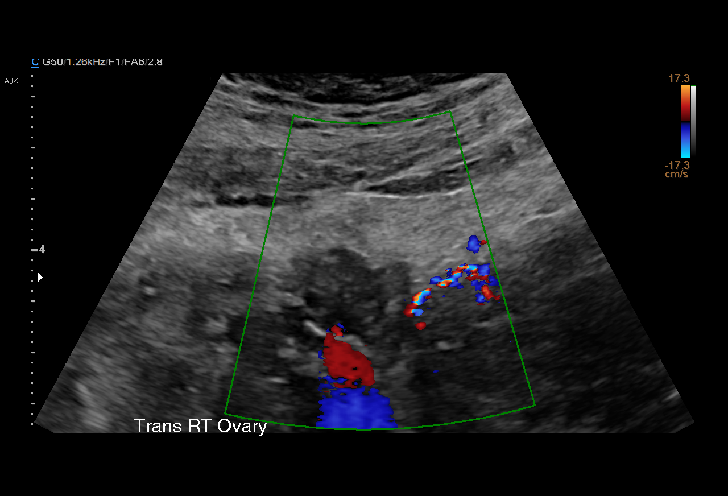
[im 27/49]
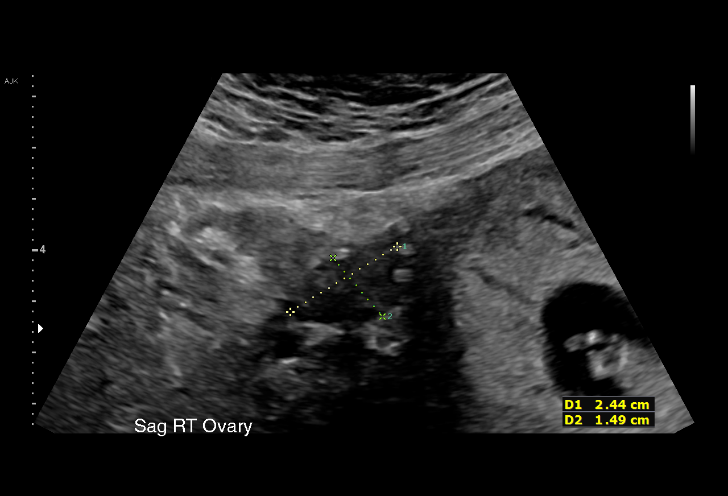
[im 31/49]
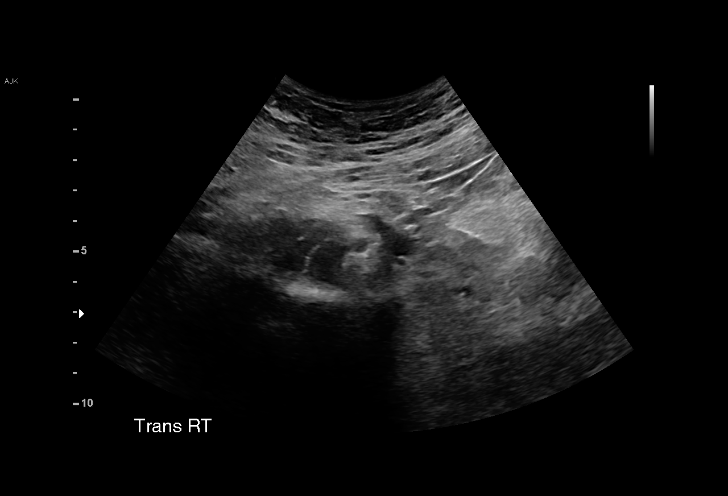
[im 34/49]
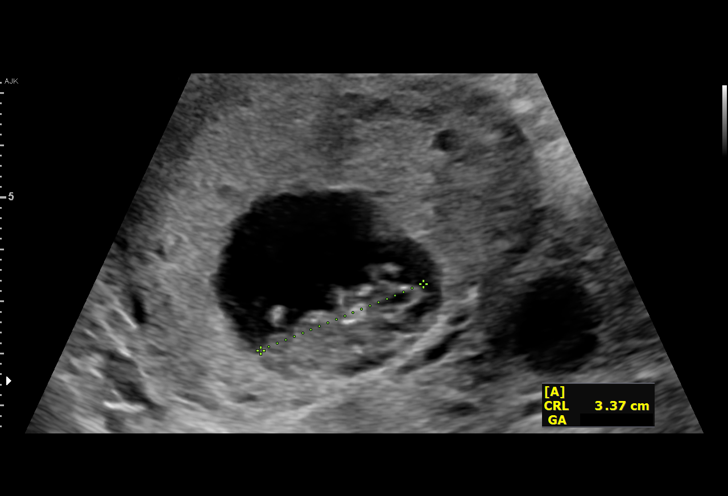
[im 38/49]
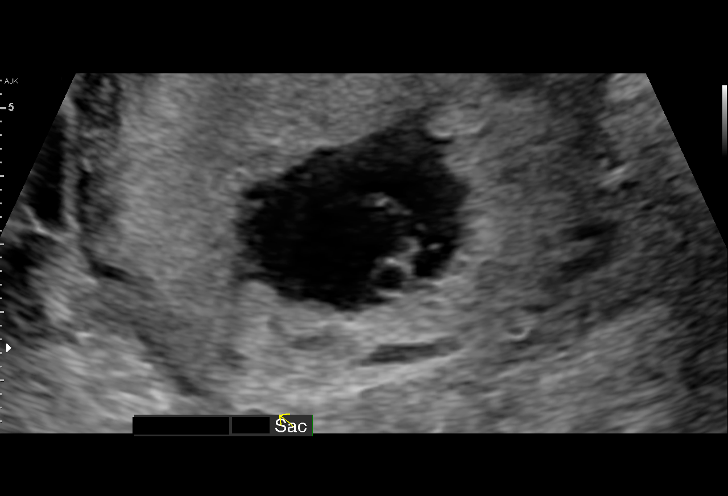
[im 41/49]
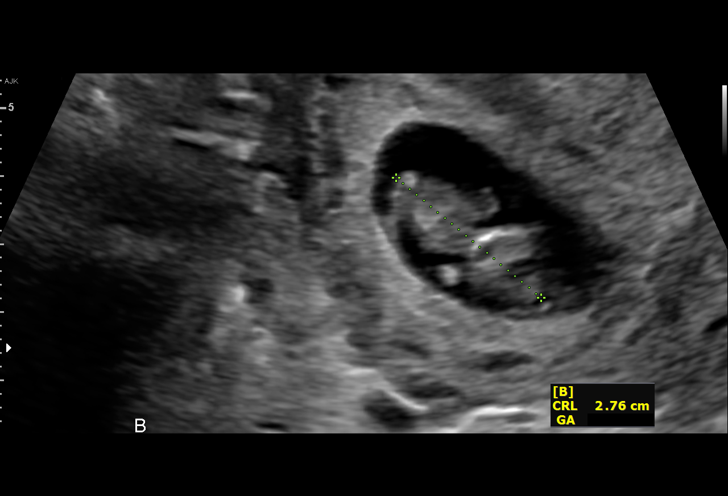
[im 45/49]
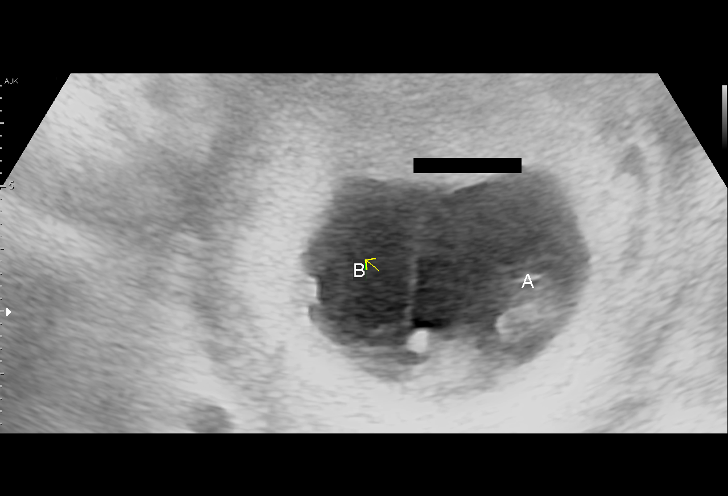
[im 49/49]
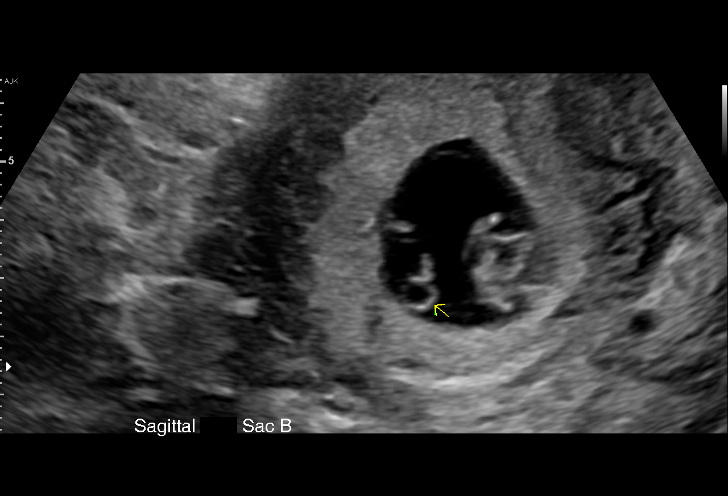

[15 of 28 positions shown; findings below may reference images not displayed]

FINDINGS: Number of IUPs:  2

Chorionicity/Amnionicity: Monochorionic-diamniotic (thin membrane,
faint, image 45)

TWIN 1

Yolk sac:  Present

Embryo:  Present

Cardiac Activity: Present

Heart Rate: 164 bpm

CRL:   33.5 mm   10 w 1 d                  US EDC: 11/27/2020

TWIN 2

Yolk sac:  Present

Embryo:  Present

Cardiac Activity: Present

Heart Rate: 162 bpm

CRL:   28.9 mm   9 w 4 d                  US EDC: 12/01/2020

Subchorionic hemorrhage:  None visualized.

Maternal uterus/adnexae:

Remainder of uterus unremarkable.

Small corpus luteal cyst within LEFT ovary, ovaries otherwise
unremarkable.

No free pelvic fluid or adnexal masses.
IMPRESSION: Live twin intrauterine gestation as above.

## 2022-07-19 ENCOUNTER — Other Ambulatory Visit: Payer: Self-pay

## 2022-07-19 ENCOUNTER — Ambulatory Visit (HOSPITAL_COMMUNITY)
Admission: EM | Admit: 2022-07-19 | Discharge: 2022-07-19 | Disposition: A | Discharge status: Home / Self Care | Payer: Medicaid Other | Attending: Physician Assistant | Admitting: Physician Assistant

## 2022-07-19 ENCOUNTER — Encounter (HOSPITAL_COMMUNITY): Payer: Self-pay | Admitting: Emergency Medicine

## 2022-07-19 ENCOUNTER — Ambulatory Visit (INDEPENDENT_AMBULATORY_CARE_PROVIDER_SITE_OTHER): Payer: Medicaid Other

## 2022-07-19 DIAGNOSIS — M5136 Other intervertebral disc degeneration, lumbar region: Secondary | ICD-10-CM | POA: Diagnosis not present

## 2022-07-19 DIAGNOSIS — M5416 Radiculopathy, lumbar region: Secondary | ICD-10-CM

## 2022-07-19 MED ORDER — PREDNISONE 10 MG (21) PO TBPK: ORAL_TABLET | ORAL | 0 refills | Status: DC

## 2022-07-19 MED ORDER — LIDOCAINE 5 % EX PTCH: 1.0000 | MEDICATED_PATCH | CUTANEOUS | 0 refills | Status: DC

## 2022-07-19 MED ORDER — METHOCARBAMOL 500 MG PO TABS: 500.0000 mg | ORAL_TABLET | Freq: Two times a day (BID) | ORAL | 0 refills | Status: DC

## 2022-07-19 NOTE — Discharge Instructions (Signed)
Your x-ray showed degenerative disc disease and arthritis but did not show any acute abnormalities which is great news.  I believe that you have injured some muscles.  Start prednisone as prescribed.  Do not take NSAIDs including aspirin, ibuprofen/Advil, naproxen/Aleve with this medication.  Use lidocaine patches on specific areas that are painful in your lower back.  Put these on for 12 hours and then remove them for 12 hours; use only 1 patch per 24 hours.  Take methocarbamol up to twice a day.  This make you sleepy so do not drive or drink alcohol with taking it.  You should space this away from your hydrocodone.  Use heat and gentle stretch for symptom relief.  If at any point you have worsening symptoms including difficulty walking, numbness or tingling in your legs, going to the bathroom yourself without noticing it you need to be seen immediately.

## 2022-07-19 NOTE — ED Triage Notes (Signed)
Reports chronic back pain.  Patient picked up a trailer to attach to the truck.  Reports trailer was full.  Patient felt pain right lower back, and now predominantly radiating down left thigh and slightly on right thigh.  This event started Thursday  Patient took gabapentin, hydrocodone x 2

## 2022-07-19 NOTE — ED Provider Notes (Signed)
[facility]    CSN: [id number] Arrival date & time: 07/19/22  1553      History   Chief Complaint Chief Complaint  Patient presents with   Back Pain    HPI Rachel Stuart is a 27 y.o. female.   Patient presents today with a several day history of worsening lower back pain.  She has a history of chronic back pain related to degenerative disc disease and is followed by orthopedics and pain management.  She reports that a few days ago she had to lift a trailer and felt a sudden severe pain in her lower back that has been persistent since that time.  Pain is currently rated 8 on a 0-10 pain scale, described as sharp, worse with certain movements, no alleviating factors identified.  She reports that pain is primarily in her midline lumbar back with radiation into both legs.  She has had previous injections but has never had surgery involving her back.  Denies any bowel/bladder incontinence or lower extremity weakness.  Denies any saddle anesthesia.  She has been taking her previously prescribed hydrocodone and gabapentin without improvement of symptoms.  She is comfort that she is not pregnant.  Denies any history of malignancy.  Denies any urinary symptoms.    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 After receiving epidural birth went well.           Home Medications    Prior to Admission medications   Medication Sig Start Date End Date Taking? Authorizing Provider  lidocaine (LIDODERM) 5 % Place 1 patch onto the skin daily. Remove & Discard patch within 12 hours or as directed by MD 07/19/22  Yes Mateusz Neilan, Noberto Retort, PA-C  methocarbamol (ROBAXIN) 500 MG tablet Take 1 tablet (500 mg total) by mouth 2 (two) times daily. 07/19/22  Yes Renato Spellman K, PA-C  predniSONE (STERAPRED UNI-PAK 21 TAB) 10 MG (21) TBPK tablet As directed 07/19/22  Yes Sybrina Laning K, PA-C  acetaminophen (TYLENOL) 500 MG tablet Take 500 mg by mouth every 6 (six) hours as needed for mild pain or headache.     [provider]  HYDROcodone-acetaminophen (NORCO/VICODIN) 5-325 MG tablet Take 1 tablet by mouth every 4 (four) hours as needed.  07/27/21   Sloan Leiter, DO  ibuprofen (ADVIL) 600 MG tablet Take 1 tablet (600 mg total) by mouth every 6 (six) hours as needed. 12/05/20   Venora Maples, MD  ibuprofen (ADVIL) 600 MG tablet Take 1 tablet (600 mg total) by mouth every 6 (six) hours as needed. 07/27/21   Sloan Leiter, DO  ondansetron (ZOFRAN) 4 MG tablet Take 1 tablet (4 mg total) by mouth every 4 (four) hours as needed for nausea or vomiting. 07/27/21   Sloan Leiter, DO  Prenatal Vit-Fe Fumarate-FA (PRENATAL MULTIVITAMIN) TABS tablet Take 1 tablet by mouth daily at 12 noon. 10/31/20   Worthy Rancher, MD    Family History Family History  Problem Relation Age of Onset   Depression Mother    Mental illness Mother    Hypertension Mother    Cancer Mother    Cancer Father    Hypertension Maternal Grandmother    Diabetes Maternal Grandmother    Stroke  Maternal Grandmother    Cancer Maternal Grandmother        uterine   Depression Maternal Grandmother    Diabetes Paternal Grandfather     Social History Social History   Tobacco Use   Smoking status: Never   Smokeless tobacco: Never  Vaping Use   Vaping Use: Never used  Substance Use Topics   Alcohol use: Not Currently   Drug use: No     Allergies   Amoxicillin   Review of Systems Review of Systems  Constitutional:  Positive for activity change. Negative for appetite change, fatigue and fever.  Musculoskeletal:  Positive for back pain. Negative for arthralgias and myalgias.  Neurological:  Negative for weakness and numbness.     Physical Exam Triage Vital Signs ED Triage Vitals  Enc Vitals Group     BP 07/19/22 1708 117/83     Pulse Rate 07/19/22 1708 71     Resp 07/19/22 1708 18     Temp 07/19/22 1708 98.3 F (36.8 C)     Temp Source 07/19/22 1708 Oral     SpO2 07/19/22 1708 98 %     Weight --      Height --      Head Circumference --      Peak Flow --      Pain Score 07/19/22 1707 6     Pain Loc --      Pain Edu? --      Excl. in GC? --    No data found.  Updated Vital Signs BP 117/83 (BP Location: Right Arm)   Pulse 71   Temp 98.3 F (36.8 C) (Oral)   Resp 18   SpO2 98%   Visual Acuity Right Eye Distance:   Left Eye Distance:   Bilateral Distance:    Right Eye Near:   Left Eye Near:    Bilateral Near:     Physical Exam Vitals reviewed.  Constitutional:      General: She is awake. She is not in acute distress.    Appearance: Normal appearance. She is well-developed. She is not ill-appearing.     Comments: Very pleasant female appears stated age in no acute distress sitting comfortably in exam room  HENT:     Head: Normocephalic and atraumatic.  Cardiovascular:     Rate and Rhythm: Normal rate and regular rhythm.     Heart sounds: Normal heart sounds, S1 normal and S2 normal. No murmur heard. Pulmonary:     Effort:  Pulmonary effort  is normal.     Breath sounds: Normal breath sounds. No wheezing, rhonchi or rales.     Comments: Clear to auscultation bilaterally Abdominal:     General: Bowel sounds are normal.     Palpations: Abdomen is soft.     Tenderness: There is no abdominal tenderness. There is no right CVA tenderness, left CVA tenderness, guarding or rebound.  Musculoskeletal:     Cervical back: No tenderness or bony tenderness.     Thoracic back: No tenderness or bony tenderness.     Lumbar back: Tenderness and bony tenderness present. Negative right straight leg raise test and negative left straight leg raise test.     Comments: Back: Pain percussion of lumbar vertebrae.  Tenderness palpation of bilateral lumbar paraspinal muscles.  Strength 5/5 bilateral lower extremities.  Psychiatric:        Behavior: Behavior is cooperative.      UC Treatments / Results  Labs (all labs ordered are listed, but only abnormal results are displayed) Labs Reviewed - No data to display  EKG   Radiology DG Lumbar Spine Complete  Result Date: 07/19/2022 CLINICAL DATA:  Back pain. EXAM: LUMBAR SPINE - COMPLETE 4+ VIEW COMPARISON:  CT abdomen pelvis dated 07/27/2021. FINDINGS: Five lumbar type vertebra. There is no acute fracture or subluxation of the lumbar spine. The vertebral body heights are maintained. 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 The soft tissues are unremarkable IMPRESSION: 1. No acute findings. 2. 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 Electronically Signed   By: Elgie Collard M.D.   On: 07/19/2022 18:32    Procedures Procedures (including critical care time)  Medications Ordered in UC Medications - No data to display  Initial Impression / Assessment and Plan / UC Course  I have reviewed the triage vital signs and the nursing notes.  Pertinent labs & imaging results that were available during my care of the patient were reviewed by me and considered in  my medical decision making (see chart for details).     Patient is well-appearing, afebrile, nontoxic, nontachycardic.  X-ray was obtained given bony tenderness and severe pain with radiculopathy which showed no acute osseous abnormality but did show degenerative findings.  Patient was started on prednisone to help manage her symptoms with instruction to take NSAIDs with this medication due to risk of GI bleeding.  She can use her hydrocodone for additional pain relief.  She was started on Robaxin to help with pain.  Discussed this can be sedating and she should not drive or drink alcohol with taking it.  She is to space this away from her other sedating medications including hydrocodone.  She can use heat, rest, stretch for symptom relief.  She was given lidocaine patches for additional symptom relief.  Discussed that she should apply these for 12 hours and then remove them for minimum 12 hours and only use 1 patch per 24 hours.  She is to follow-up closely with her orthopedic provider for further evaluation management.  Discussed that if she has any worsening or changing symptoms including increasing pain, bowel/bladder incontinence, lower extremity weakness, saddle anesthesia she is to be seen immediately.  Strict return precautions given.  Work excuse note provided.  Final Clinical Impressions(s) / UC Diagnoses   Final diagnoses:  Lumbar radiculopathy  DDD (degenerative disc disease), lumbar     Discharge Instructions      Your x-ray showed degenerative disc disease and arthritis but did  not show any acute abnormalities which is great news.  I believe that you have injured some muscles.  Start prednisone as prescribed.  Do not take NSAIDs including aspirin, ibuprofen/Advil, naproxen/Aleve with this medication.  Use lidocaine patches on specific areas that are painful in your lower back.  Put these on for 12 hours and then remove them for 12 hours; use only 1 patch per 24 hours.  Take  methocarbamol up to twice a day.  This make you sleepy so do not drive or drink alcohol with taking it.  You should space this away from your hydrocodone.  Use heat and gentle stretch for symptom relief.  If at any point you have worsening symptoms including difficulty walking, numbness or tingling in your legs, going to the bathroom yourself without noticing it you need to be seen immediately.     ED Prescriptions     Medication Sig Dispense Auth. Provider   predniSONE (STERAPRED UNI-PAK 21 TAB) 10 MG (21) TBPK tablet As directed 21 tablet Cortlyn Cannell K, PA-C   methocarbamol (ROBAXIN) 500 MG tablet Take 1 tablet (500 mg total) by mouth 2 (two) times daily. 20 tablet Lacosta Hargan K, PA-C   lidocaine (LIDODERM) 5 % Place 1 patch onto the skin daily. Remove & Discard patch within 12 hours or as directed by MD 30 patch Caedence Snowden K, PA-C      I have reviewed the PDMP during this encounter.   Jeani Hawking, PA-C 07/19/22 1844

## 2022-09-20 ENCOUNTER — Encounter (HOSPITAL_COMMUNITY): Payer: Self-pay | Admitting: *Deleted

## 2022-09-20 ENCOUNTER — Ambulatory Visit (HOSPITAL_COMMUNITY)
Admission: EM | Admit: 2022-09-20 | Discharge: 2022-09-20 | Disposition: A | Discharge status: Home / Self Care | Payer: Medicaid Other | Attending: Internal Medicine | Admitting: Internal Medicine

## 2022-09-20 ENCOUNTER — Other Ambulatory Visit: Payer: Self-pay

## 2022-09-20 DIAGNOSIS — G43809 Other migraine, not intractable, without status migrainosus: Secondary | ICD-10-CM | POA: Diagnosis not present

## 2022-09-20 MED ORDER — DEXAMETHASONE SODIUM PHOSPHATE 10 MG/ML IJ SOLN
INTRAMUSCULAR | Status: AC
  Filled 2022-09-20: qty 1

## 2022-09-20 MED ORDER — KETOROLAC TROMETHAMINE 30 MG/ML IJ SOLN
30.0000 mg | Freq: Once | INTRAMUSCULAR | Status: AC
  Administered 2022-09-20: 30 mg via INTRAMUSCULAR

## 2022-09-20 MED ORDER — DEXAMETHASONE SODIUM PHOSPHATE 10 MG/ML IJ SOLN
10.0000 mg | Freq: Once | INTRAMUSCULAR | Status: AC
  Administered 2022-09-20: 10 mg via INTRAMUSCULAR

## 2022-09-20 MED ORDER — METOCLOPRAMIDE HCL 5 MG/ML IJ SOLN
INTRAMUSCULAR | Status: AC
  Filled 2022-09-20: qty 2

## 2022-09-20 MED ORDER — METOCLOPRAMIDE HCL 5 MG/ML IJ SOLN
5.0000 mg | Freq: Once | INTRAMUSCULAR | Status: AC
  Administered 2022-09-20: 5 mg via INTRAMUSCULAR

## 2022-09-20 MED ORDER — KETOROLAC TROMETHAMINE 30 MG/ML IJ SOLN
INTRAMUSCULAR | Status: AC
  Filled 2022-09-20: qty 1

## 2022-09-20 NOTE — ED Provider Notes (Signed)
[facility]    CSN: [id number] Arrival date & time: 09/20/22  1543      History   Chief Complaint Chief Complaint  Patient presents with   Headache    HPI Rachel Stuart is a 27 y.o. female.   Patient presents to urgent care for evaluation of headache that started last night. Headache starts to the posterior right neck that radiates to the front of the right head but does not cross midline head. Pain is currently 5/10 and is worsened by bright light/loud noises. Denies recent head trauma/injuries. No dizziness, vision changes, extremity weakness, paresthesias, URI symptoms, vomiting, diarrhea, abdominal pain, or rash. No fever/chills. Reports intermittent nausea. No personal history of migraines, states older sister suffers from migraines. She took Excedrin migraine this morning without relief. Then she took tylenol without relief. Denies chance of pregnancy, has nexplanon.    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 After receiving  epidural birth went well.           Home Medications    Prior to Admission medications   Medication Sig Start Date End Date Taking? Authorizing Provider  acetaminophen (TYLENOL) 500 MG tablet Take 500 mg by mouth every 6 (six) hours as needed for mild pain or headache.     [provider]  HYDROcodone-acetaminophen (NORCO/VICODIN) 5-325 MG tablet Take 1 tablet by mouth every 4 (four) hours as needed. 07/27/21   Sloan Leiter, DO  ibuprofen (ADVIL) 600 MG tablet Take 1 tablet (600 mg total) by mouth every 6 (six) hours as needed. 12/05/20   Venora Maples, MD  ibuprofen (ADVIL) 600 MG tablet Take 1 tablet (600 mg total) by mouth every 6 (six) hours as needed. 07/27/21   Tanda Rockers A, DO  lidocaine (LIDODERM) 5 % Place 1 patch onto the skin daily. Remove & Discard patch within 12 hours or as directed by MD 07/19/22   Raspet, Noberto Retort, PA-C  methocarbamol (ROBAXIN) 500 MG tablet Take 1 tablet (500 mg total) by mouth 2 (two) times daily. 07/19/22   Raspet, Erin K, PA-C  ondansetron (ZOFRAN) 4 MG tablet Take 1 tablet (4 mg total) by mouth every 4 (four) hours as needed for nausea or vomiting.  07/27/21   Sloan Leiter, DO  predniSONE (STERAPRED UNI-PAK 21 TAB) 10 MG (21) TBPK tablet As directed 07/19/22   Raspet, Noberto Retort, PA-C  Prenatal Vit-Fe Fumarate-FA (PRENATAL MULTIVITAMIN) TABS tablet Take 1 tablet by mouth daily at 12 noon. 10/31/20   Worthy Rancher, MD    Family History Family History  Problem Relation Age of Onset   Depression Mother    Mental illness Mother    Hypertension Mother    Cancer Mother    Cancer Father    Hypertension Maternal Grandmother    Diabetes Maternal Grandmother    Stroke Maternal Grandmother    Cancer Maternal Grandmother        uterine   Depression Maternal Grandmother    Diabetes Paternal Grandfather     Social History Social History   Tobacco Use   Smoking status: Never   Smokeless tobacco: Never  Vaping Use   Vaping Use: Never used   Substance Use Topics   Alcohol use: Not Currently   Drug use: No     Allergies   Amoxicillin   Review of Systems Review of Systems  Neurological:  Positive for headaches.  Per HPI   Physical Exam Triage Vital Signs ED Triage Vitals  Enc Vitals Group     BP 09/20/22 1624 116/80     Pulse Rate 09/20/22 1624 68     Resp 09/20/22 1624 20     Temp 09/20/22 1624 99.6 F (37.6 C)     Temp src --      SpO2 09/20/22 1624 98 %     Weight --      Height --      Head Circumference --      Peak Flow --      Pain Score 09/20/22 1621 5     Pain Loc --      Pain Edu? --      Excl. in GC? --    No data found.  Updated Vital Signs BP 116/80   Pulse 68   Temp 99.6 F (37.6 C)   Resp 20   SpO2 98%   Visual Acuity Right Eye Distance:   Left Eye Distance:   Bilateral Distance:    Right Eye Near:   Left Eye Near:    Bilateral Near:     Physical Exam Vitals and nursing note reviewed.  Constitutional:      Appearance: She is ill-appearing. She is not toxic-appearing.  HENT:     Head: Normocephalic and atraumatic.     Right Ear: Hearing, tympanic membrane, ear canal and external ear normal.     Left Ear: Hearing, tympanic membrane, ear canal and external ear normal.     Nose: Nose normal.     Mouth/Throat:     Lips: Pink.     Mouth: Mucous membranes are moist. No injury.     Tongue: No lesions. Tongue does not deviate from midline.     Palate: No mass and lesions.     Pharynx: Oropharynx is clear. Uvula midline. No pharyngeal swelling, oropharyngeal exudate, posterior oropharyngeal erythema or uvula swelling.     Tonsils: No tonsillar exudate or tonsillar abscesses.  Eyes:     General: Lids are normal. Vision grossly intact. Gaze aligned appropriately.     Extraocular Movements: Extraocular movements intact.     Conjunctiva/sclera: Conjunctivae normal.  Neck:     Trachea: Trachea and phonation normal.     Meningeal: Brudzinski's sign and Kernig's sign absent.  Cardiovascular:     Rate and Rhythm: Normal rate and regular rhythm.     Heart sounds: Normal heart sounds, S1 normal and S2 normal.  Pulmonary:     Effort: Pulmonary effort is normal. No respiratory distress.     Breath sounds: Normal breath sounds and air entry.  Musculoskeletal:     Cervical back: Normal range of motion and neck supple. Pain with movement and muscular tenderness (mild muscular tenderness to the right trapezius muscle) present.  Lymphadenopathy:     Cervical: No cervical adenopathy.  Skin:    General: Skin is warm and dry.     Capillary Refill: Capillary refill takes less than 2 seconds.     Findings: No rash.  Neurological:     General: No focal deficit present.     Mental Status: She is alert and oriented to person, place, and time. Mental status is at baseline.     Cranial Nerves: Cranial nerves 2-12 are intact. No dysarthria or facial asymmetry.     Sensory: Sensation is intact.     Motor: Motor function is intact.     Coordination: Coordination is intact.     Gait: Gait is intact.     Comments: Strength and sensation intact to bilateral upper and lower extremities (5/5). Moves all 4 extremities with normal coordination voluntarily. Non-focal neuro exam.   Psychiatric:        Mood and Affect: Mood normal.        Speech: Speech normal.        Behavior: Behavior normal.        Thought Content: Thought content normal.        Judgment: Judgment normal.      UC Treatments / Results  Labs (all labs ordered are listed, but only abnormal results are displayed) Labs Reviewed - No data to display  EKG   Radiology No results found.  Procedures Procedures (including critical care time)  Medications Ordered in UC Medications  ketorolac (TORADOL) 30 MG/ML injection 30 mg (has no administration in time range)  metoCLOPramide (REGLAN) injection 5 mg (has no administration in time range)  dexamethasone (DECADRON) injection 10 mg (has no administration in  time range)    Initial Impression / Assessment and Plan / UC Course  I have reviewed the triage vital signs and the nursing notes.  Pertinent labs & imaging results that were available during my care of the patient were reviewed by me and considered in my medical decision making (see chart for details).   1. Other migraine without status migranosus, not intractable Evaluation suggests migraine type headache. Low suspicion for acute intracranial abnormality/meningitis. Neurologic exam without focal deficit, patient intact to baseline.  Nursing administered headache cocktail in clinic for headache relief. Patient placed in quiet room for 15 minutes after headache cocktail administration and this improved headache from 5/10 to 3/10. Neck pain improved significantly as well. May use OTC medications as needed for further head pain relief.  Encouraged to drink plenty of fluids to stay well hydrated. Follow-up with PCP as needed for further evaluation of persistent headaches. Work excuse note given.  Discussed red flag signs and symptoms of worsening condition,when to call the PCP office, return to urgent care, and when to seek higher level of care in the emergency department. Counseled patient regarding appropriate use of medications and potential side effects for all medications recommended or prescribed today. Patient verbalizes understanding and agreement with plan. Discharged in stable condition.     Final  Clinical Impressions(s) / UC Diagnoses   Final diagnoses:  Other migraine without status migrainosus, not intractable     Discharge Instructions      You have been evaluated today for headache.  You were given medicines for your headache in the clinic today which included a strong NSAID, so do not  take ibuprofen or other NSAIDS (Aleve, aspirin, naproxen, ibuprofen, goody powder, etc.) for the next 12 hours.  Starting tomorrow, take 600mg  ibuprofen every 6 hours or tylenol 1,000  every 6 hours as needed for pain.  Avoid areas of loud noise/harsh light and remember to drink plenty of water to stay well hydrated.  Please follow up with your primary care provider for further management of your headaches.  Please seek emergency medical care if you experience worsening or uncontrolled pain, vision changes, recurrent vomiting, difficulty with normal activities, abnormal behavior, difficulty walking, numbness, weakness, or any other concerning symptoms.   I hope you feel better!      ED Prescriptions   None    PDMP not reviewed this encounter.   Carlisle Beers, [place] 09/20/22 1740

## 2022-09-20 NOTE — Discharge Instructions (Signed)
You have been evaluated today for headache.  You were given medicines for your headache in the clinic today which included a strong NSAID, so do not  take ibuprofen or other NSAIDS (Aleve, aspirin, naproxen, ibuprofen, goody powder, etc.) for the next 12 hours.  Starting tomorrow, take 600mg ibuprofen every 6 hours or tylenol 1,000 every 6 hours as needed for pain.  Avoid areas of loud noise/harsh light and remember to drink plenty of water to stay well hydrated.  Please follow up with your primary care provider for further management of your headaches.  Please seek emergency medical care if you experience worsening or uncontrolled pain, vision changes, recurrent vomiting, difficulty with normal activities, abnormal behavior, difficulty walking, numbness, weakness, or any other concerning symptoms.   I hope you feel better!   

## 2022-09-20 NOTE — ED Triage Notes (Signed)
Pt reports HA on rt side today,nausea . Pt has tried OTC for HA  with out relief. Pt reports HA started last night .

## 2022-10-01 IMAGING — US US MFM OB COMP +14 WKS
1 series · 12 of 28 positions shown · non-contrast
Comparison: none

[Series 1: us mfm ob comp +14 wks · 54 acquisitions, 12 frames shown]
[im 2/54]
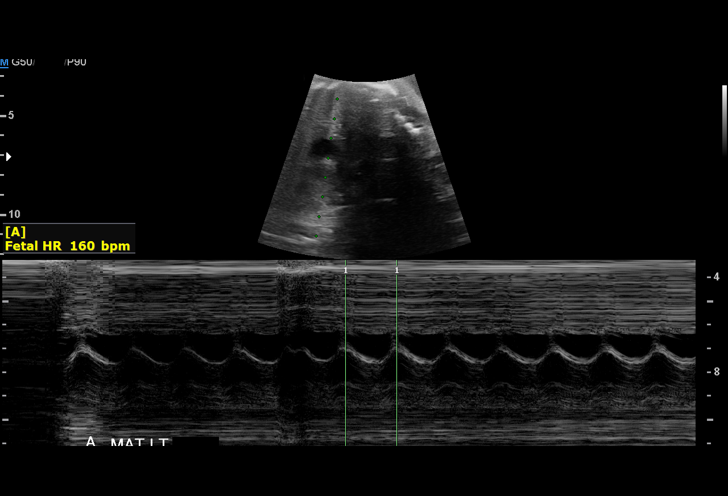
[im 6/54]
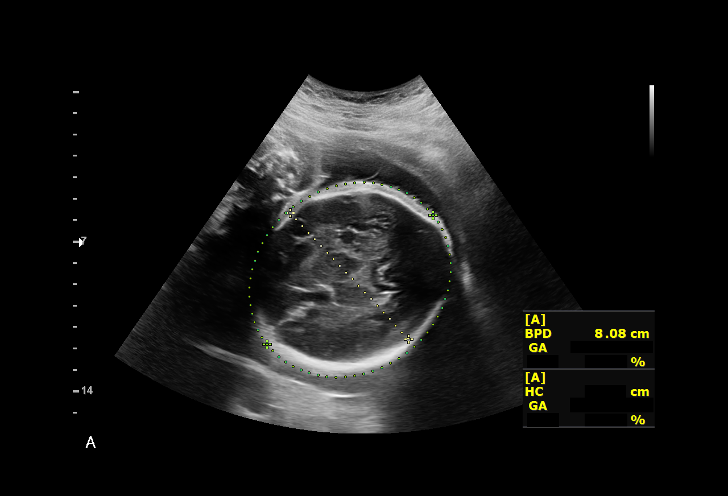
[im 10/54]
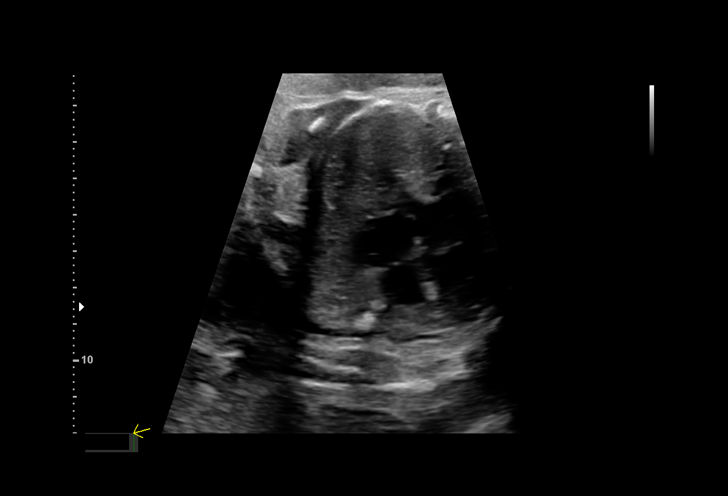
[im 16/54]
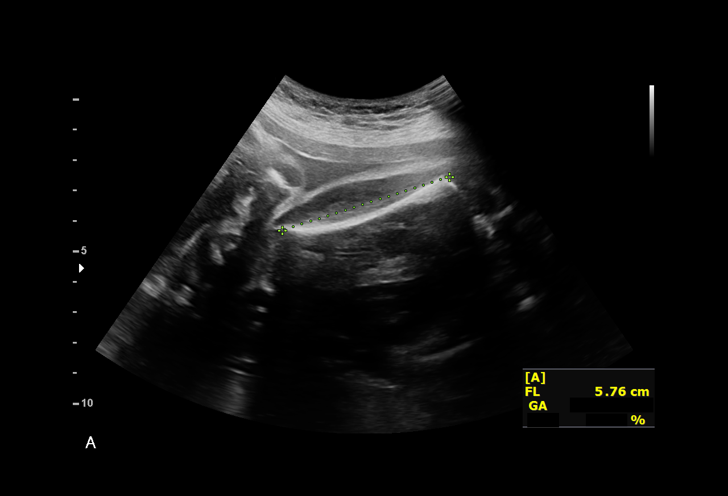
[im 20/54]
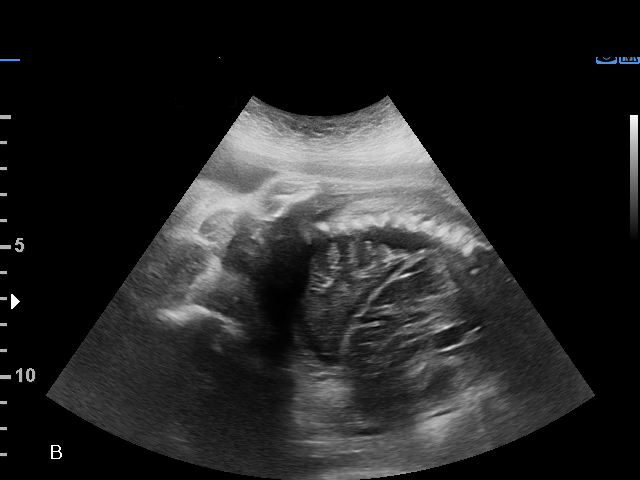
[im 24/54]
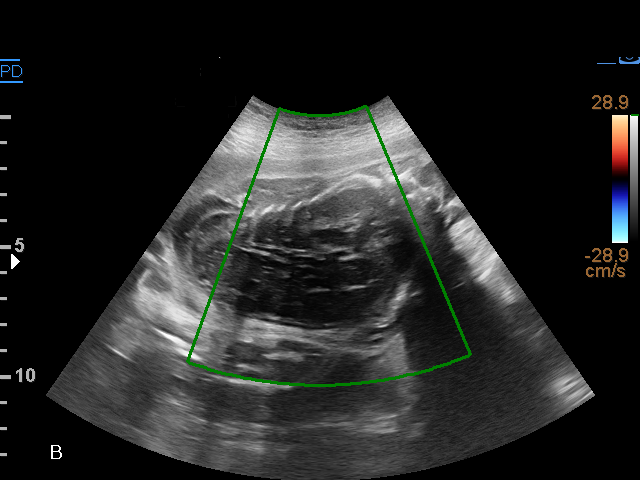
[im 30/54]
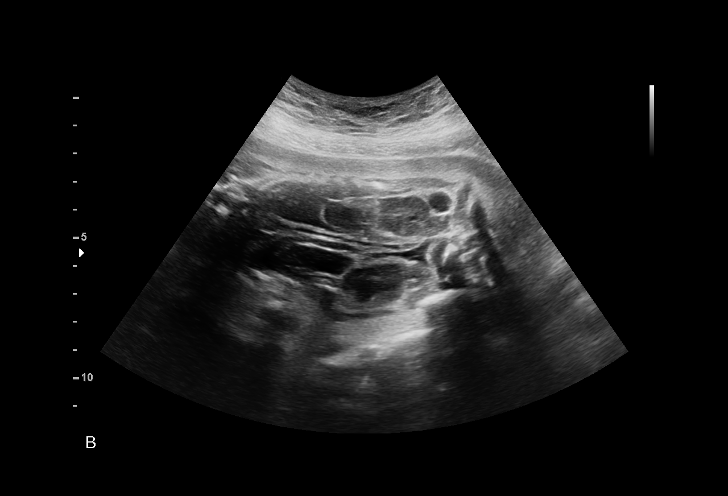
[im 34/54]
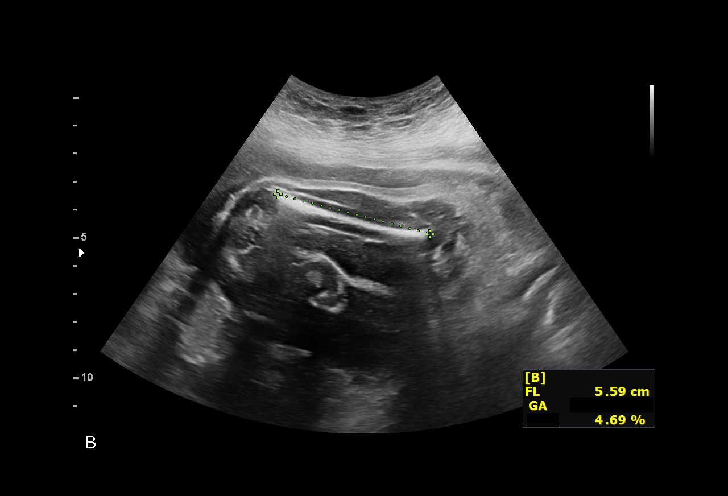
[im 38/54]
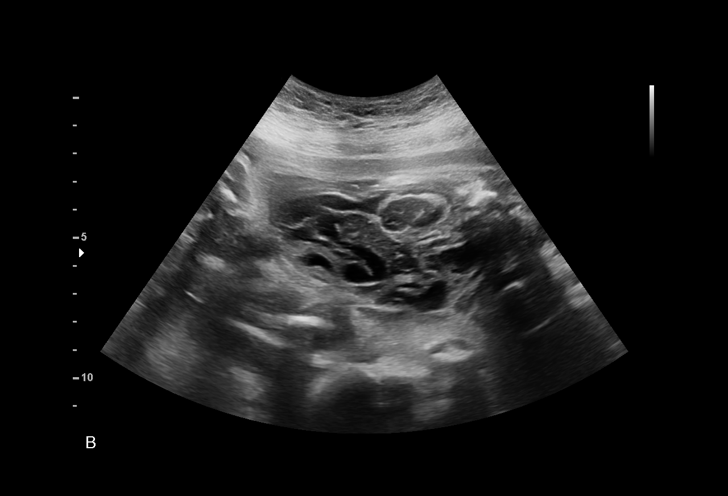
[im 44/54]
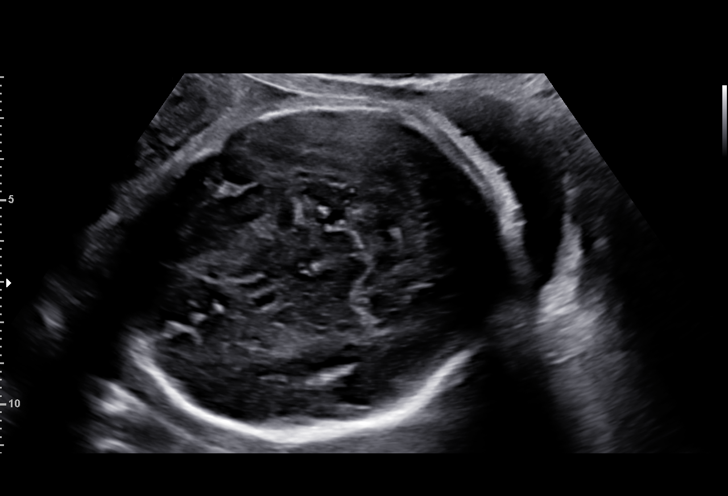
[im 48/54]
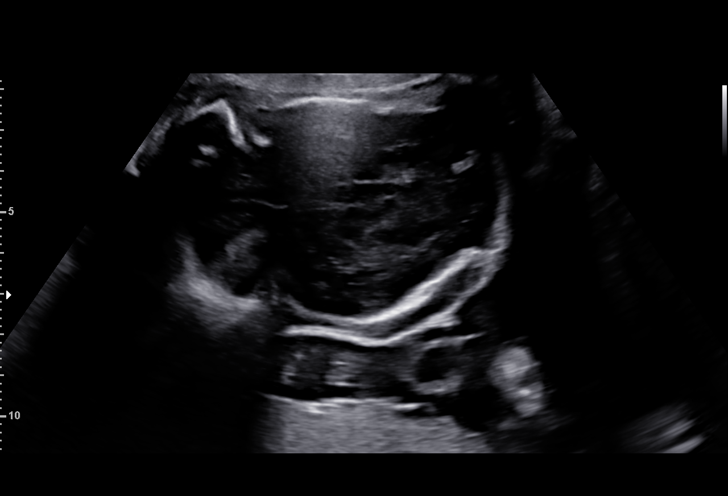
[im 52/54]
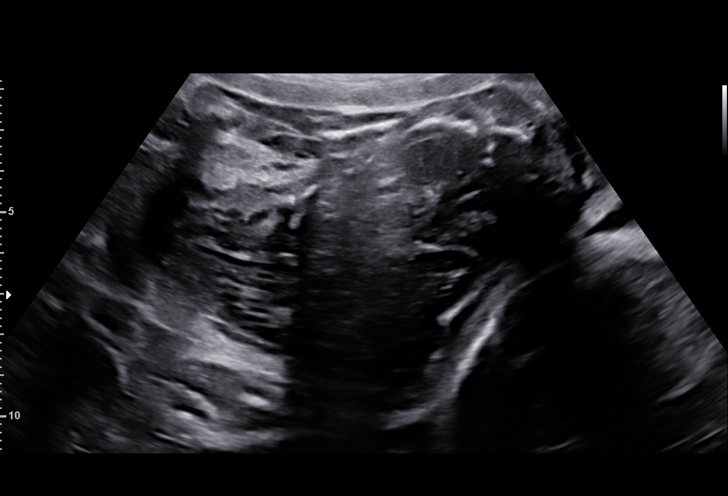

[12 of 28 positions shown; findings below may reference images not displayed]

+ 14 WK

Indications

 Twin pregnancy, Kekelia/Benny, third trimester
 31 weeks gestation of pregnancy
 Insufficient Prenatal Care
 Fetal demise greater than 22 weeks
Fetal Evaluation (Fetus A)

 Num Of Fetuses:         2
 Fetal Heart Rate(bpm):  160
 Cardiac Activity:       Observed
 Fetal Lie:              Maternal left side
 Presentation:           Cephalic
 Placenta:               Posterior
 P. Cord Insertion:      Not well visualized
 Membrane Desc:      Dividing Membrane seen - Monochorionic

 Amniotic Fluid
 AFI FV:      Within normal limits

                             Largest Pocket(cm)

Biometry (Fetus A)
 BPD:      80.5  mm     G. Age:  32w 2d         75  %    CI:        77.43   %    70 - 86
                                                         FL/HC:      19.9   %    19.3 -
 HC:      289.6  mm     G. Age:  31w 6d         33  %    HC/AC:      1.11        0.96 -
 AC:       262   mm     G. Age:  30w 2d         24  %    FL/BPD:     71.6   %    71 - 87
 FL:       57.6  mm     G. Age:  30w 1d         14  %    FL/AC:      22.0   %    20 - 24

 Est. FW:    7733  gm      3 lb 8 oz     21  %     FW Discordancy     0 \ 22 %
OB History

 Gravidity:    4         Term:   2        Prem:   2
 Living:       2
Gestational Age (Fetus A)

 LMP:           32w 3d        Date:  02/12/20                 EDD:   11/18/20
 U/S Today:     31w 1d                                        EDD:   11/27/20
 Best:          31w 1d     Det. By:  Early Ultrasound         EDD:   11/27/20
                                     (05/02/20)
Anatomy (Fetus A)

 Stomach:               Appears normal, left   Bladder:                Appears normal
                        sided
 Kidneys:               Appear normal
Doppler - Fetal Vessels (Fetus A)

 Umbilical Artery
  S/D     %tile      RI    %tile      PI    %tile            ADFV    RDFV
  2.97       63    0.66       66    1.02       69               No      No

 Middle Cerebral Artery
                                                      PSV   MoM
                                                    (cm/s)


Fetal Evaluation (Fetus B)

 Num Of Fetuses:         2
 Cardiac Activity:       Absent
 Fetal Lie:              Upper Fetus
 Presentation:           Transverse, head to maternal right
 Placenta:               Posterior
 P. Cord Insertion:      Not well visualized
 Membrane Desc:      Dividing Membrane seen - Monochorionic

 Amniotic Fluid
 AFI FV:      Within normal limits

                             Largest Pocket(cm)

Biometry (Fetus B)
 BPD:      62.8  mm     G. Age:  25w 3d        < 1  %    CI:        66.89   %    70 - 86
                                                         FL/HC:      22.7   %    19.3 -
 HC:       246   mm     G. Age:  26w 5d        < 1  %    HC/AC:      1.00        0.96 -
 AC:      246.2  mm     G. Age:  28w 6d        2.9  %    FL/BPD:     89.0   %    71 - 87
 FL:       55.9  mm     G. Age:  29w 3d        4.7  %    FL/AC:      22.7   %    20 - 24

 Est. FW:    0762  gm    2 lb 12 oz     < 1  %     FW Discordancy        22  %
Gestational Age (Fetus B)

 LMP:           32w 3d        Date:  02/12/20                 EDD:   11/18/20
 U/S Today:     27w 4d                                        EDD:   12/22/20
 Best:          31w 1d     Det. By:  Early Ultrasound         EDD:   11/27/20
                                     (05/02/20)
Cervix Uterus Adnexa

 Cervix
 Not visualized (advanced GA >92wks)

 Right Ovary
 Not visualized.

 Left Ovary
 Not visualized.
Comments

 This patient was seen due to a spontaneously conceived
 monochorionic, diamniotic twin gestation where a demise of
 twin B was noted earlier today.  Due to insurance issues, the
 patient has not had any prenatal care in her current
 pregnancy until today.  She did have two visits to the [facility] for
 various complaints earlier in her pregnancy.  She had a first
 trimester ultrasound performed in the [facility] which confirmed a
 monochorionic, diamniotic twin gestation.
 The patient has a history of two prior full-term uncomplicated
 vaginal deliveries.  Her past medical history is significant for
 depression.  The only medication she is currently taking is
 Zofran.
 As the patient has not had any prenatal care, she has not had
 any screening tests for fetal aneuploidy drawn in her current
 pregnancy.
 On today's exam, a demise of twin B was noted.  The fetal
 biometry measurements obtained from twin B (the demised
 fetus) measures about 4 weeks behind her dates.  The
 patient was advised that it is uncertain if the demise occurred
 a while ago or if the demise occurred due to selective growth
 restriction of twin B.  As there appears to be postmortem
 changes associated with twin B, I suspect that the demise
 most likely occurred a while ago.
 The fetal biometry measurements obtained for twin A (the live
 fetus) appears appropriate for her gestational age.  There
 was normal amniotic fluid noted around twin A.  Doppler
 studies of the umbilical arteries performed in twin A continues
 to show normal forward flow.  MCA Doppler studies
 performed in twin A did not show any signs of fetal anemia.
 Fetal movements were noted throughout today's exam in twin
 The views of the fetal anatomy were limited today due to her
 advanced gestational age.
 The patient was advised that due to a demise of one fetus in
 a monochorionic, diamniotic twin gestation, there is a roughly
 25% risk of cotwin loss and there may be a 25% risk of
 neurodevelopmental injury in the surviving fetus.  The patient
 was advised that her baby will have to be examined after birth
 to determine if any neurodevelopmental injury is present.
 The pediatricians should be notified at the time of delivery
 that this was a monochorionic twin pregnancy where a
 demise of one twin occurred.  A brain MRI should be
 obtained following delivery.
 Due to the risk of demise in the surviving twin, we will
 continue to follow her with weekly biophysical profiles.  Fetal
 Meliki Dejo instructions were reviewed.
 To avoid another demise, delivery may be considered at
 around 36 weeks.
 A total of 30 minutes was spent counseling and coordinating
 the care for this patient.  Greater than 50% of the time was
 spent in direct face-to-face contact.

## 2022-10-08 IMAGING — US US MFM FETAL BPP W/ NON-STRESS
1 series · 14 of 28 positions shown · non-contrast
Comparison: none

[Series 1: us mfm fetal bpp w/ non-stress · 38 acquisitions, 14 frames shown]
[im 2/38]
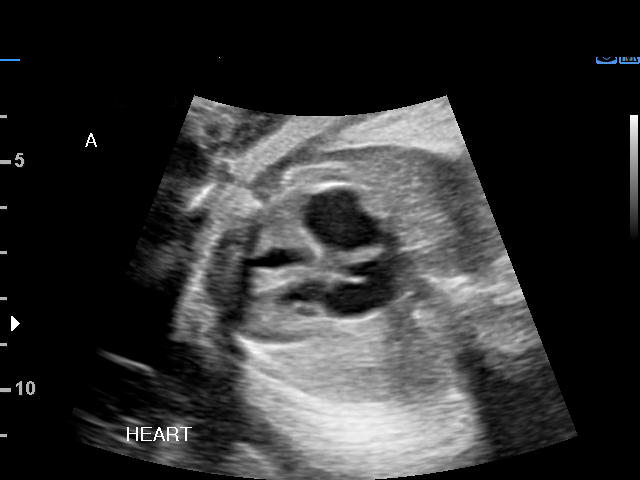
[im 5/38]
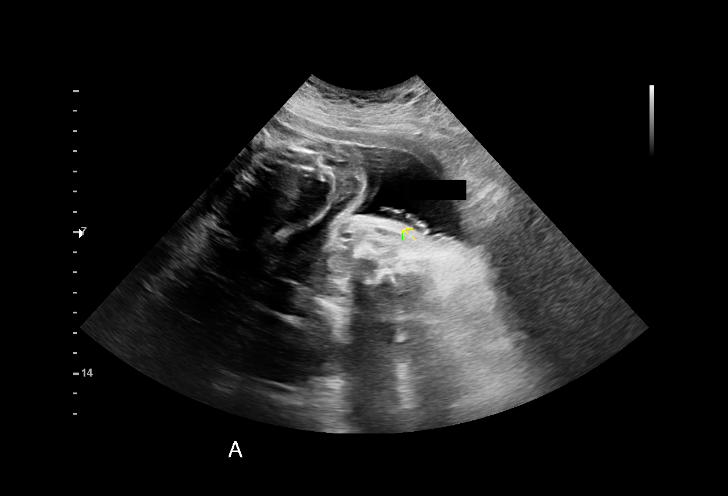
[im 7/38]
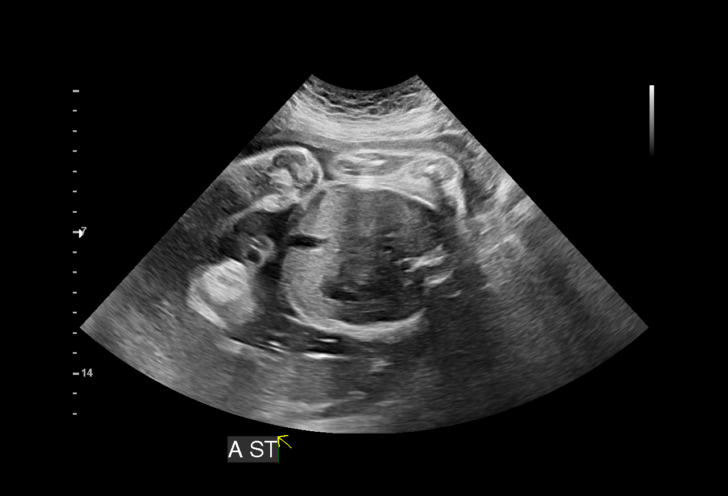
[im 10/38]
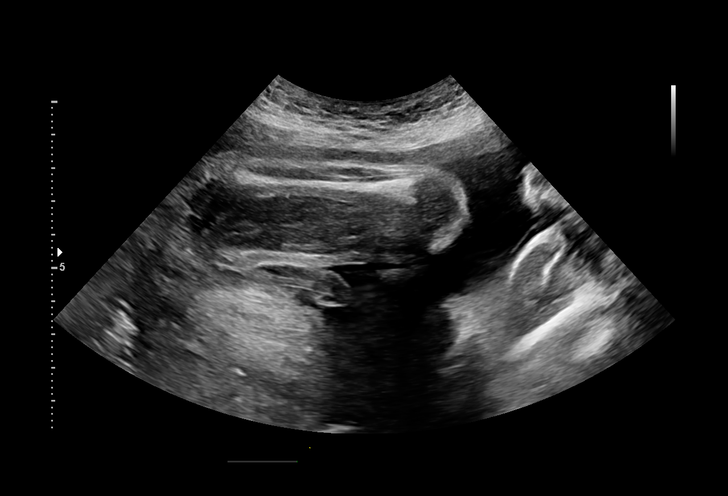
[im 13/38]
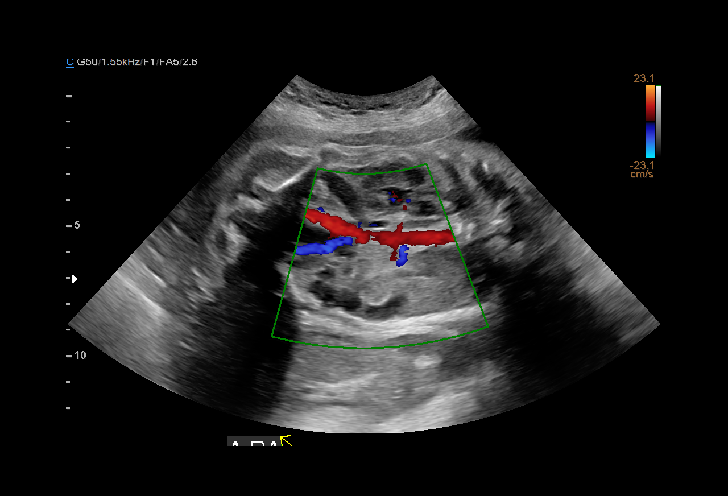
[im 16/38]
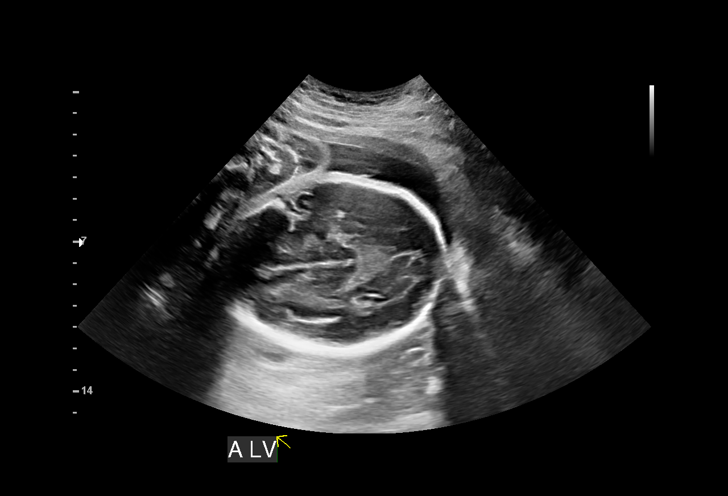
[im 18/38]
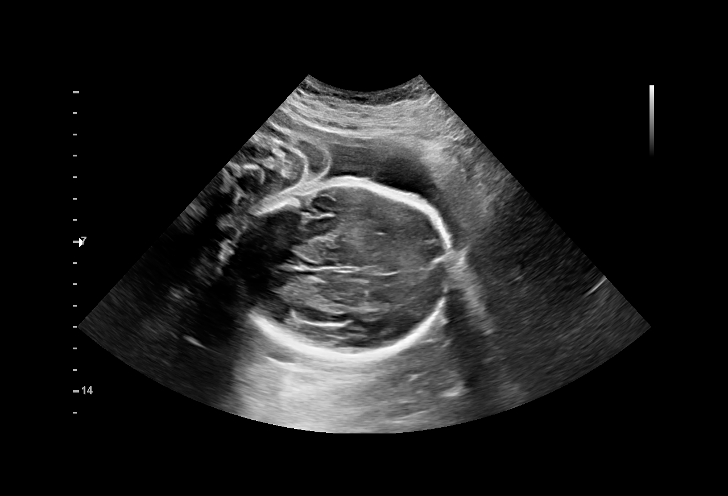
[im 21/38]
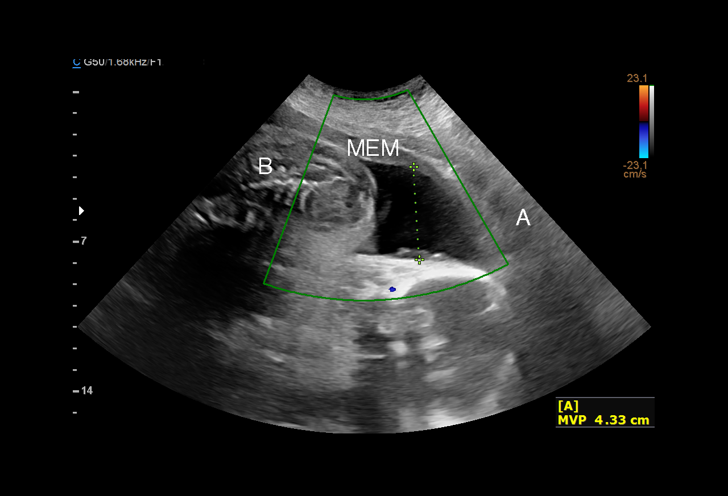
[im 24/38]
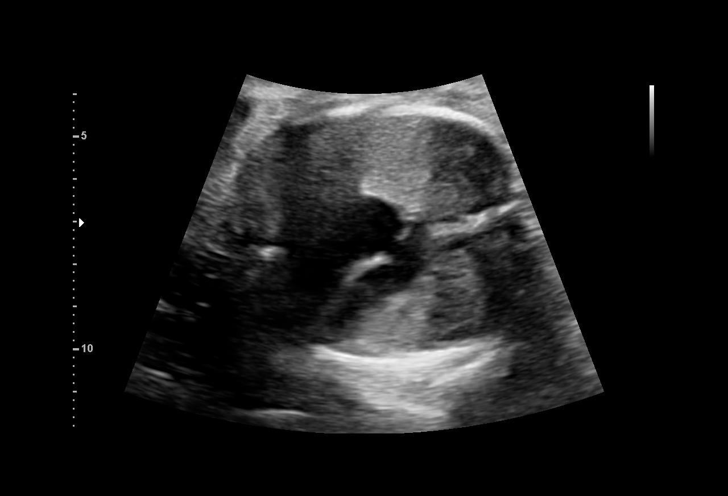
[im 27/38]
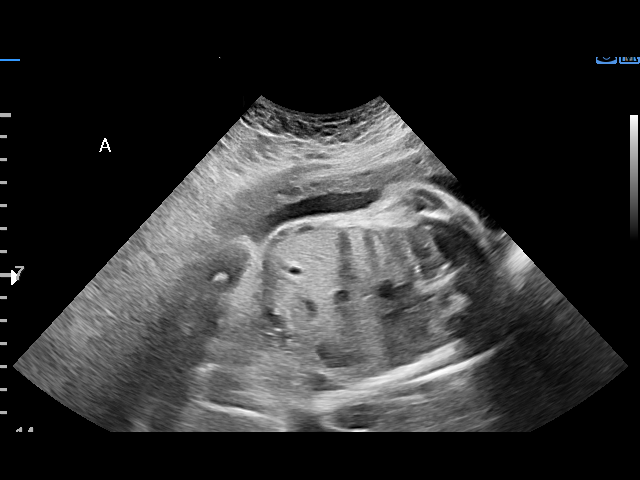
[im 29/38]
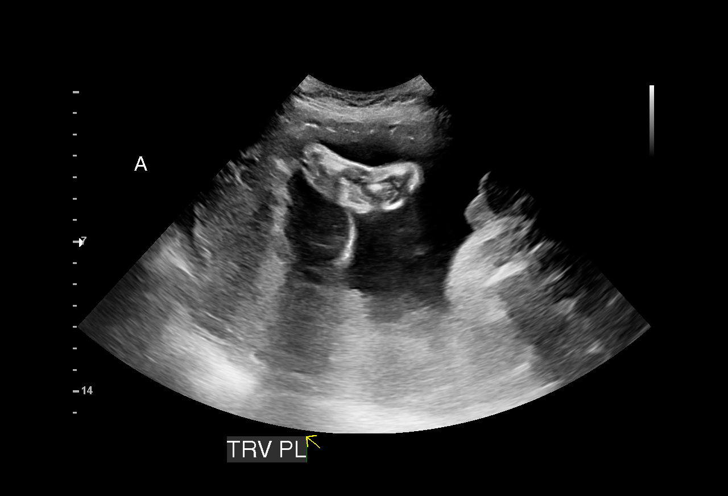
[im 32/38]
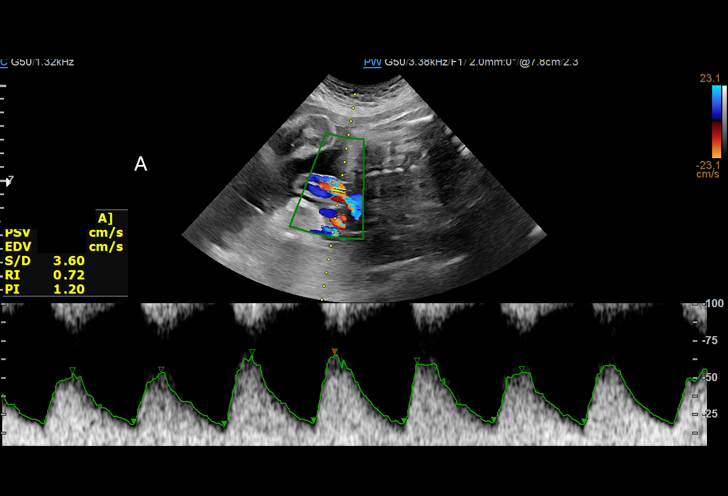
[im 35/38]
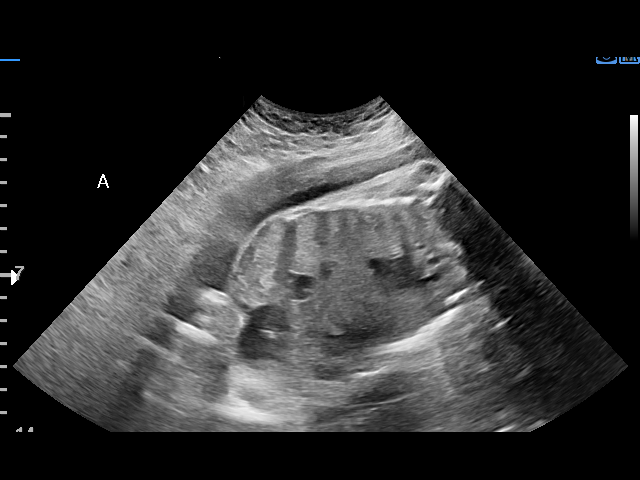
[im 38/38]
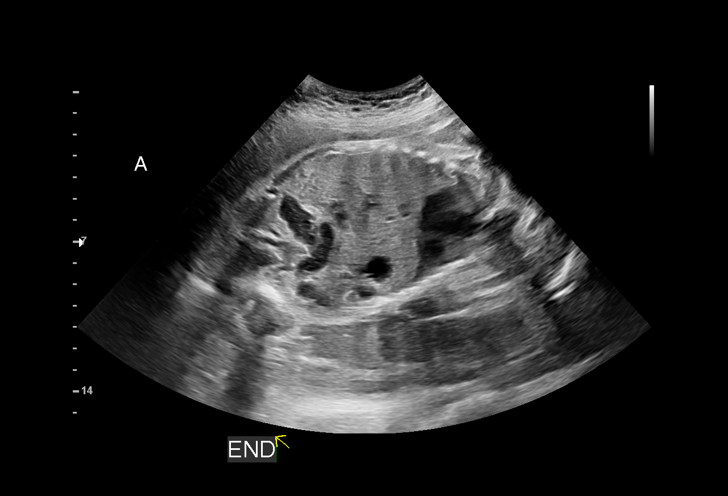

[14 of 28 positions shown; findings below may reference images not displayed]

W/NONSTRESS

Indications

 32 weeks gestation of pregnancy
 Twin pregnancy, Rudi/Serpa, third trimester
 Insufficient Prenatal Care
 Fetal demise greater than 22 weeks
Fetal Evaluation (Fetus A)

 Num Of Fetuses:         2
 Preg. Location:         Intrauterine
 Fetal Heart Rate(bpm):  130
 Cardiac Activity:       Observed
 Fetal Lie:              Maternal left side
 Presentation:           Cephalic
 Placenta:               Posterior
 Membrane Desc:      Dividing Membrane seen - Monochorionic

 Amniotic Fluid
 AFI FV:      Within normal limits

                             Largest Pocket(cm)

Biophysical Evaluation (Fetus A)

 Amniotic F.V:   Within normal limits       F. Tone:        Observed
 F. Movement:    Observed                   N.S.T:          Reactive
 F. Breathing:   Not Observed               Score:          [DATE]
Biometry (Fetus A)

 LV:        4.5  mm
OB History

 Gravidity:    4         Term:   2        Prem:   2
 Living:       2
Gestational Age (Fetus A)

 LMP:           33w 3d        Date:  02/12/20                 EDD:   11/18/20
 Best:          32w 1d     Det. By:  Early Ultrasound         EDD:   11/27/20
                                     (05/02/20)
Anatomy (Fetus A)

 Cranium:               Appears normal         Cord Vessels:           Appears normal (3
                                                                       vessel cord)
 Cavum:                 Appears normal         Kidneys:                Appear normal
 Ventricles:            Appears normal         Bladder:                Appears normal
 Heart:                 Limited Views          Upper Extremities:      Limited Views
 Diaphragm:             Appears normal         Lower Extremities:      Limited Views
 Stomach:               Appears normal, left
                        sided
Doppler - Fetal Vessels (Fetus A)

 Umbilical Artery
  S/D     %tile      RI    %tile      PI    %tile     PSV    ADFV    RDFV
                                                    (cm/s)
  3.48       87    0.71       87    0.[REDACTED]       N       N

 Middle Cerebral Artery
  S/D                RI               PI    %tile     PSV   MoM
                                                    (cm/s)
  1.05             0.82             1.[REDACTED]

 Cerebroplacental Ratio
                        MCA PI / UA PI     %tile
                                  2.05        26

Fetal Evaluation (Fetus B)

 Num Of Fetuses:         2
 Cardiac Activity:       Not visualized
Gestational Age (Fetus B)
 LMP:           33w 3d        Date:  02/12/20                 EDD:   11/18/20
 Best:          32w 1d     Det. By:  Early Ultrasound         EDD:   11/27/20
                                     (05/02/20)
Impression

 Monochorionic-diamniotic twin pregnancy with fetal demise of

 Twin A (surviving twin): Maternal left, cephalic presentation,
 posterior placenta.  Amniotic fluid is normal and good fetal
 activity seen.  Fetal breathing movements did not meet the
 criteria of BPP.  NST is reactive.  BPP [DATE].  Middle cerebral
 artery Doppler showed normal peak systolic velocity
 measurements (no evidence of fetal anemia).  Umbilical
 artery Doppler showed normal forward diastolic flow.
 Intracranial structures appear normal.

 Twin B: Fetus with no cardiac activity seen.

 I counseled the patient on the possibility of intracranial lesion
 developing in surviving twin in about 15 to 25% of cases.
 Fetal brain MRI may give additional information if performed
 in 3 to 4 weeks time.
 Patient opted not to have fetal brain MRI and prefers
 evaluation of the newborn after birth.
Recommendations

 - Continue weekly BPP and MCA Doppler studies till delivery.
                 Elrahman, Apriyadi

## 2022-10-22 IMAGING — US US MFM FETAL BPP W/O NON-STRESS
1 series · 15 of 17 positions shown · non-contrast
Comparison: none

[Series 1: us mfm fetal bpp w/o non-stress · 17 acquisitions, 15 frames shown]
[im 1/17]
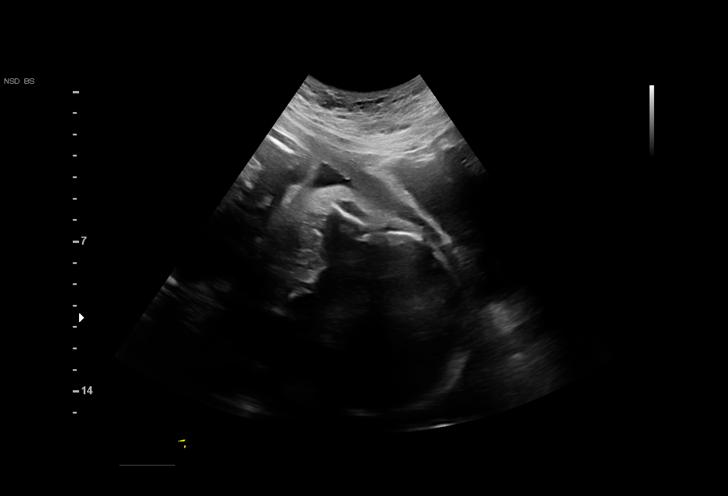
[im 2/17]
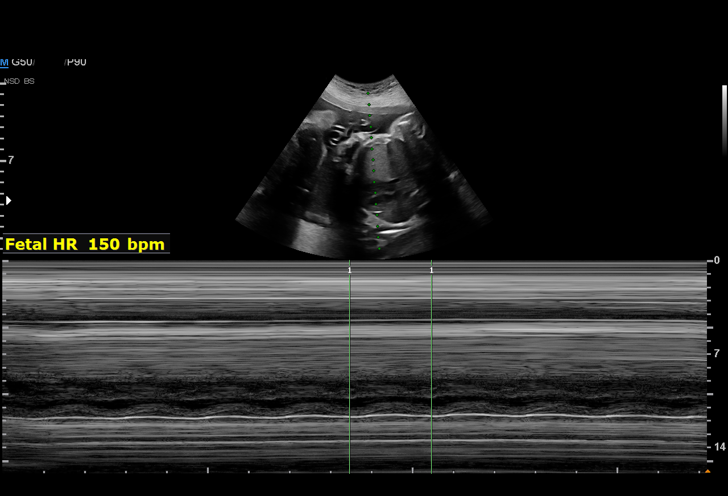
[im 3/17]
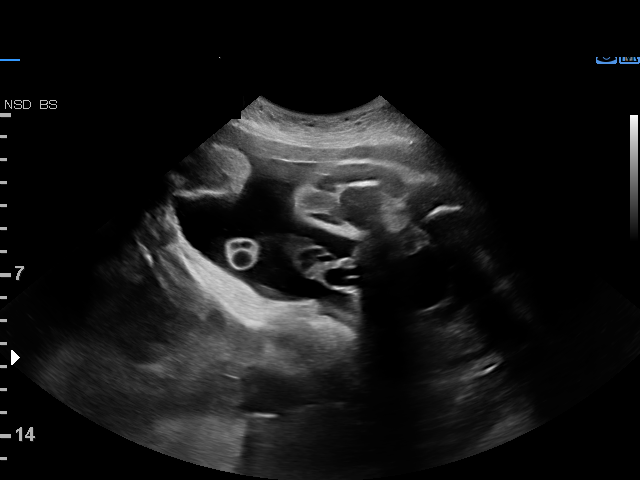
[im 4/17]
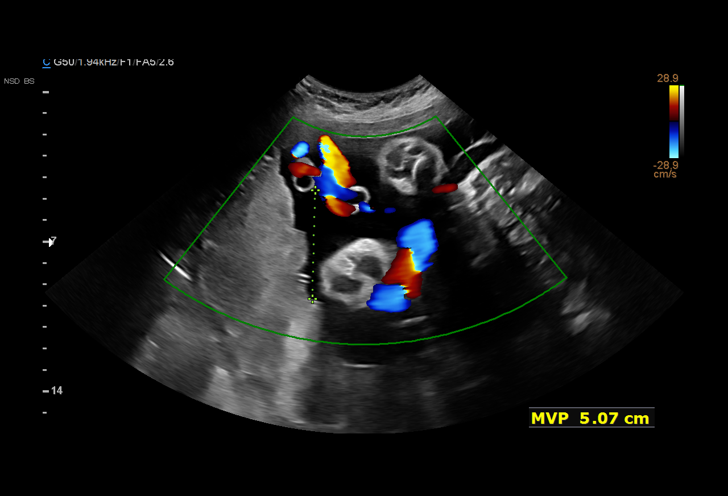
[im 6/17]
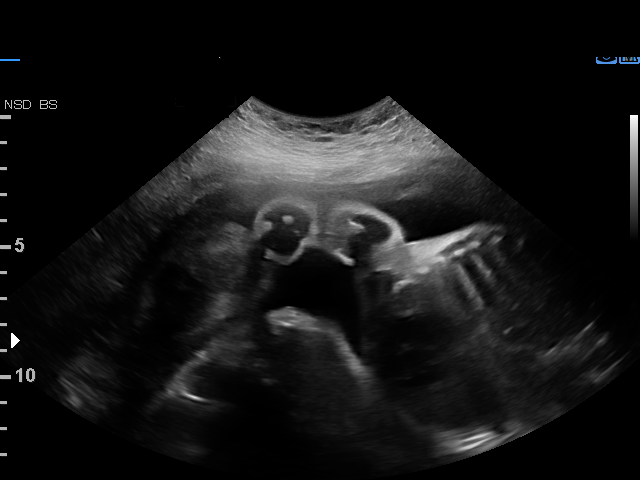
[im 7/17]
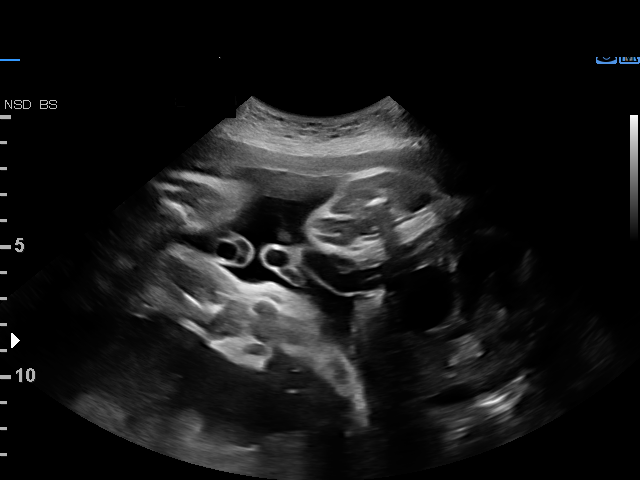
[im 8/17]
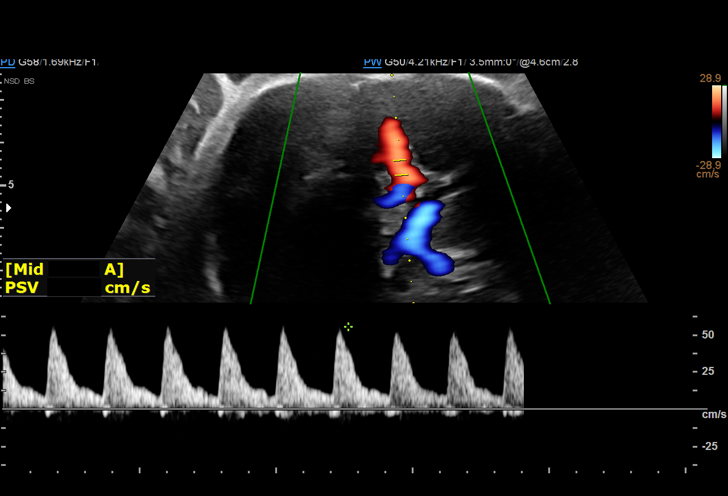
[im 9/17]
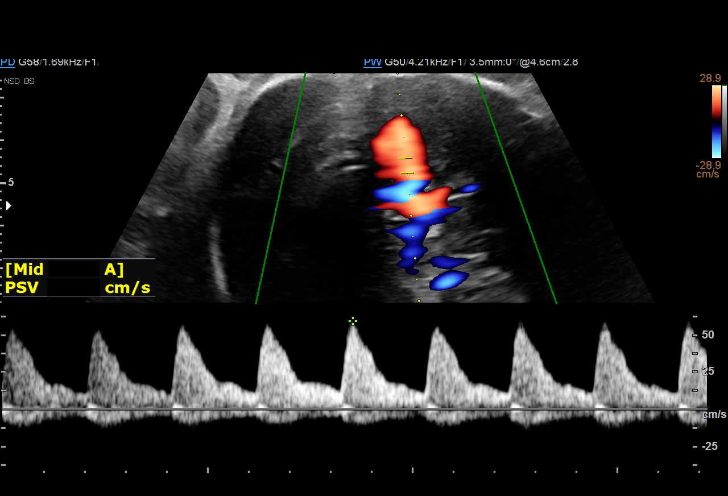
[im 10/17]
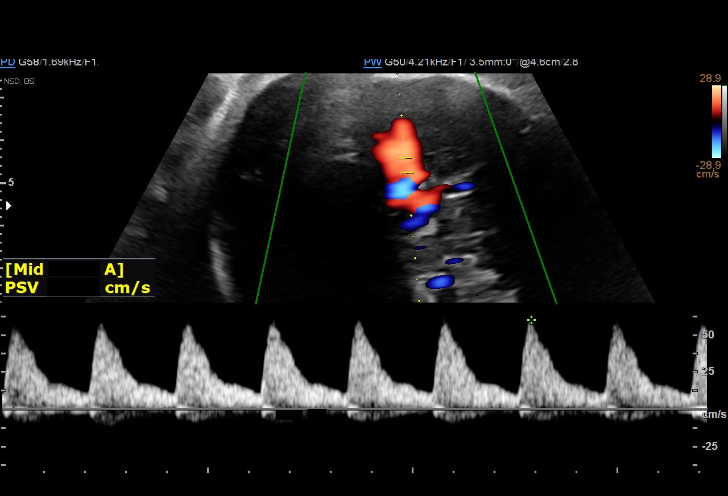
[im 11/17]
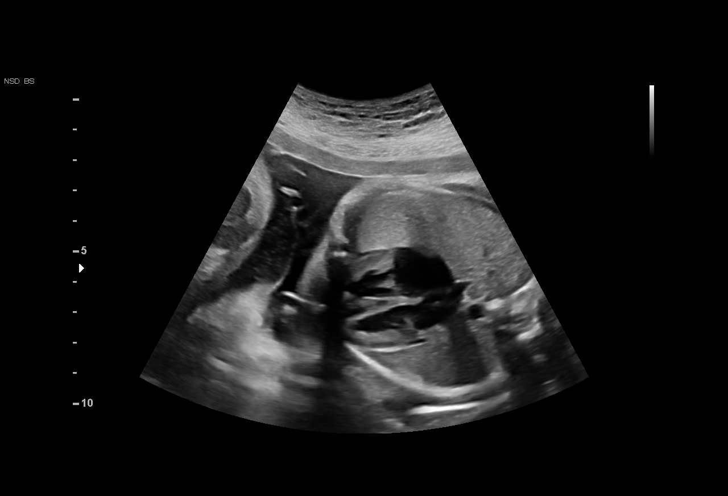
[im 12/17]
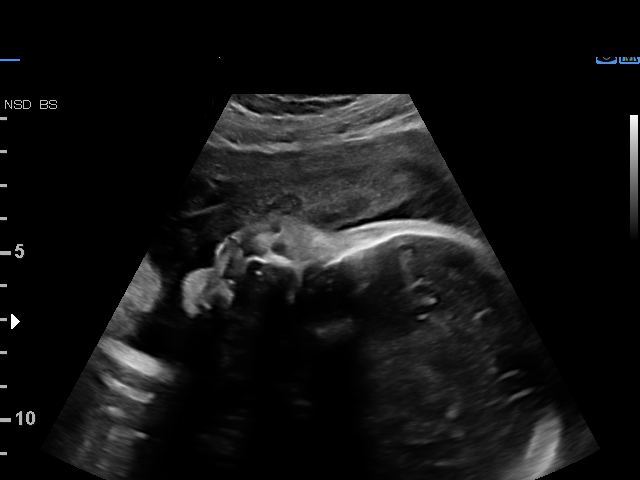
[im 14/17]
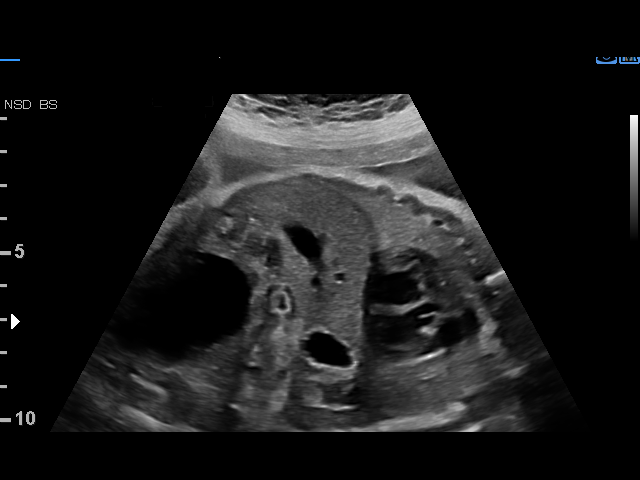
[im 15/17]
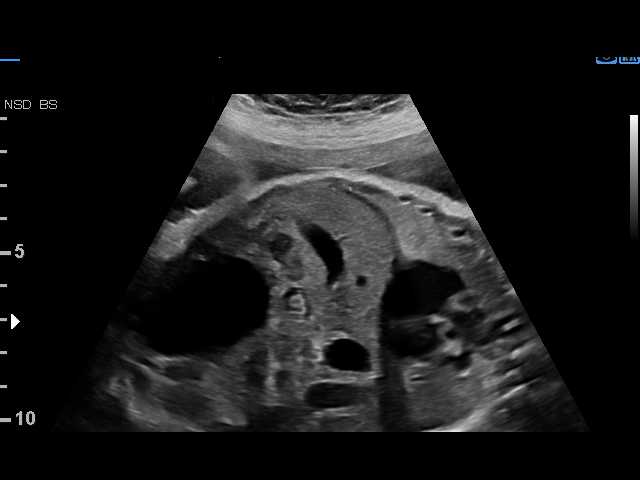
[im 16/17]
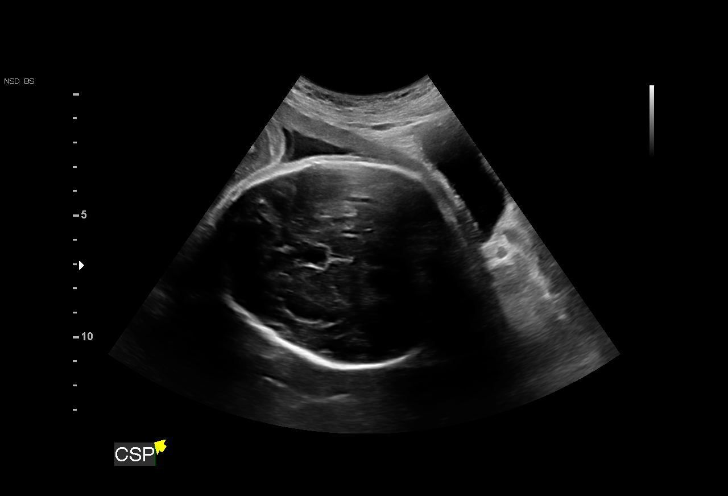
[im 17/17]
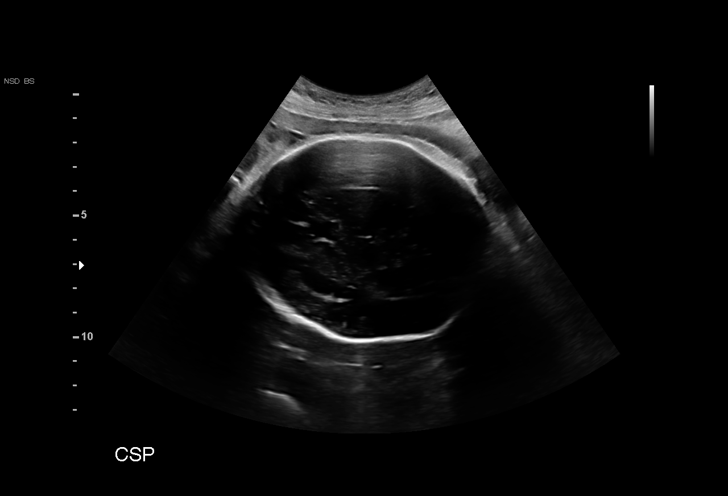

[15 of 17 positions shown; findings below may reference images not displayed]

Indications

 Fetal demise greater than 22 weeks
 34 weeks gestation of pregnancy
 Twin pregnancy, Giorgi/Tiger, third trimester
 Insufficient Prenatal Care
Fetal Evaluation (Fetus A)

 Num Of Fetuses:         2
 Fetal Heart Rate(bpm):  150
 Cardiac Activity:       Observed
 Fetal Lie:              Maternal left side
 Presentation:           Cephalic
 Placenta:               Posterior

 Amniotic Fluid
 AFI FV:      Within normal limits

                             Largest Pocket(cm)

Biophysical Evaluation (Fetus A)

 Amniotic F.V:   Within normal limits       F. Tone:        Observed
 F. Movement:    Observed                   Score:          [DATE]
 F. Breathing:   Observed
OB History

 Gravidity:    4         Term:   2        Prem:   2
 Living:       2
Gestational Age (Fetus A)

 LMP:           35w 3d        Date:  02/12/20                 EDD:   11/18/20
 Best:          34w 1d     Det. By:  Early Ultrasound         EDD:   11/27/20
                                     (05/02/20)
Doppler - Fetal Vessels (Fetus A)

 Middle Cerebral Artery
                                                      PSV   MoM
                                                    (cm/s)


Fetal Evaluation (Fetus B)

 Num Of Fetuses:         2
 Cardiac Activity:       Absent
Gestational Age (Fetus B)

 LMP:           35w 3d        Date:  02/12/20                 EDD:   11/18/20
 Best:          34w 1d     Det. By:  Early Ultrasound         EDD:   11/27/20
                                     (05/02/20)
Impression

 Monochorionic-diamniotic twin pregnancy with fetal demise of
 1 twin (twin B).  Patient had detailed [HOSPITAL] Fetal
 Care consultation.  At her last visit, she had opted not to have
 fetal brain MRI in this pregnancy.
 Amniotic fluid is normal good fetal activity seen.  Antenatal
 testing is reassuring.  BPP [DATE].  Middle cerebral artery
 Doppler showed normal peak systolic velocity measurements
 (no evidence of fetal anemia).

 I discussed timing of delivery.  Given that she had fetal
 demise of 1 twin, I recommend delivery at 36 weeks gestation.
Recommendations

 - BPP and MCA Doppler next week.
 -Delivery at 36 weeks gestation (will be sending an in basket
 message to her providers).
                 Abcede, Jhayme

## 2023-03-10 ENCOUNTER — Emergency Department (HOSPITAL_COMMUNITY): Payer: Medicaid Other

## 2023-03-10 ENCOUNTER — Encounter (HOSPITAL_COMMUNITY): Payer: Self-pay

## 2023-03-10 ENCOUNTER — Observation Stay (HOSPITAL_COMMUNITY)
Admission: EM | Admit: 2023-03-10 | Discharge: 2023-03-12 | Disposition: A | Discharge status: Home / Self Care | Payer: Medicaid Other | Attending: Surgery | Admitting: Surgery

## 2023-03-10 DIAGNOSIS — K8010 Calculus of gallbladder with chronic cholecystitis without obstruction: Principal | ICD-10-CM

## 2023-03-10 DIAGNOSIS — K8012 Calculus of gallbladder with acute and chronic cholecystitis without obstruction: Principal | ICD-10-CM | POA: Insufficient documentation

## 2023-03-10 DIAGNOSIS — K810 Acute cholecystitis: Secondary | ICD-10-CM | POA: Diagnosis present

## 2023-03-10 DIAGNOSIS — R1011 Right upper quadrant pain: Secondary | ICD-10-CM | POA: Diagnosis present

## 2023-03-10 LAB — CBC
HCT: 41.4 % (ref 36.0–46.0)
Hemoglobin: 14.1 g/dL (ref 12.0–15.0)
MCH: 29.6 pg (ref 26.0–34.0)
MCHC: 34.1 g/dL (ref 30.0–36.0)
MCV: 87 fL (ref 80.0–100.0)
Platelets: 379 10*3/uL (ref 150–400)
RBC: 4.76 MIL/uL (ref 3.87–5.11)
RDW: 12.2 % (ref 11.5–15.5)
WBC: 12.1 10*3/uL — ABNORMAL HIGH (ref 4.0–10.5)
nRBC: 0 % (ref 0.0–0.2)

## 2023-03-10 LAB — URINALYSIS, ROUTINE W REFLEX MICROSCOPIC
Bilirubin Urine: NEGATIVE
Glucose, UA: NEGATIVE mg/dL
Hgb urine dipstick: NEGATIVE
Ketones, ur: NEGATIVE mg/dL
Nitrite: POSITIVE — AB
Protein, ur: NEGATIVE mg/dL
Specific Gravity, Urine: 1.019 (ref 1.005–1.030)
pH: 6 (ref 5.0–8.0)

## 2023-03-10 LAB — COMPREHENSIVE METABOLIC PANEL
ALT: 13 U/L (ref 0–44)
AST: 15 U/L (ref 15–41)
Albumin: 4.1 g/dL (ref 3.5–5.0)
Alkaline Phosphatase: 71 U/L (ref 38–126)
Anion gap: 8 (ref 5–15)
BUN: 5 mg/dL — ABNORMAL LOW (ref 6–20)
CO2: 24 mmol/L (ref 22–32)
Calcium: 9.4 mg/dL (ref 8.9–10.3)
Chloride: 106 mmol/L (ref 98–111)
Creatinine, Ser: 0.86 mg/dL (ref 0.44–1.00)
GFR, Estimated: >60 mL/min (ref >60)
Glucose, Bld: 101 mg/dL — ABNORMAL HIGH (ref 70–99)
Potassium: 3.8 mmol/L (ref 3.5–5.1)
Sodium: 138 mmol/L (ref 135–145)
Total Bilirubin: 0.4 mg/dL (ref <1.2)
Total Protein: 7.3 g/dL (ref 6.5–8.1)

## 2023-03-10 LAB — LIPASE, BLOOD: Lipase: 39 U/L (ref 11–51)

## 2023-03-10 LAB — HCG, SERUM, QUALITATIVE: Preg, Serum: NEGATIVE

## 2023-03-10 MED ORDER — HYDROCODONE-ACETAMINOPHEN 5-325 MG PO TABS
1.0000 | ORAL_TABLET | Freq: Once | ORAL | Status: AC
  Administered 2023-03-10: 1 via ORAL
  Filled 2023-03-10: qty 1

## 2023-03-10 MED ORDER — IOHEXOL 350 MG/ML SOLN
75.0000 mL | Freq: Once | INTRAVENOUS | Status: AC | PRN
  Administered 2023-03-10: 75 mL via INTRAVENOUS

## 2023-03-10 MED ORDER — ONDANSETRON 4 MG PO TBDP
4.0000 mg | ORAL_TABLET | Freq: Once | ORAL | Status: AC
  Administered 2023-03-10: 4 mg via ORAL
  Filled 2023-03-10: qty 1

## 2023-03-10 NOTE — ED Triage Notes (Signed)
Pt is coming in for multiple complaints, one being upper lumbar pain that she states it not the usually lower lumbar pain she experiences. She also has some abd pain as well that is causing her nausea and vomiting. She mentions that she has taken all of her pain medications at home but none are working because she has vomited up the medications. She is curled up in a ball in triage moaning in pain and crying at this time.

## 2023-03-10 NOTE — ED Provider Triage Note (Signed)
Emergency Medicine Provider Triage Evaluation Note  Rachel Stuart , a 27 y.o. female  was evaluated in triage.  Pt complains of generalized abd pain that began this morning. Patient states that it started in her RUQ but then said it started around her bellybutton. Endorses emesis. Denies CP, shob. Denies dysuria, hematuria. Still has GB and appendix. Denies fevers.  Review of Systems  Positive:  Negative:   Physical Exam  BP (!) 155/108   Pulse 83   Temp 99.5 F (37.5 C) (Oral)   Resp 18   SpO2 98%  Gen:   Awake, no distress   Resp:  Normal effort  MSK:   Moves extremities without difficulty  Other:  Generalized abd tenderness without peritoneal signs  Medical Decision Making  Medically screening exam initiated at 5:24 PM.  Appropriate orders placed.  Fina Fread was informed that the remainder of the evaluation will be completed by another provider, this initial triage assessment does not replace that evaluation, and the importance of remaining in the ED until their evaluation is complete.  Workup initiated, abd labs and imaging ordered, pt stable.   Netta Corrigan, PA-C 03/10/23 1728

## 2023-03-11 ENCOUNTER — Encounter (HOSPITAL_COMMUNITY): Admission: EM | Disposition: A | Discharge status: Home / Self Care | Payer: Self-pay | Source: Home / Self Care

## 2023-03-11 ENCOUNTER — Other Ambulatory Visit: Payer: Self-pay

## 2023-03-11 ENCOUNTER — Inpatient Hospital Stay (HOSPITAL_COMMUNITY): Payer: Medicaid Other

## 2023-03-11 ENCOUNTER — Emergency Department (HOSPITAL_COMMUNITY): Payer: Medicaid Other

## 2023-03-11 ENCOUNTER — Encounter (HOSPITAL_COMMUNITY): Payer: Self-pay

## 2023-03-11 ENCOUNTER — Inpatient Hospital Stay (HOSPITAL_BASED_OUTPATIENT_CLINIC_OR_DEPARTMENT_OTHER): Payer: Medicaid Other

## 2023-03-11 DIAGNOSIS — K810 Acute cholecystitis: Secondary | ICD-10-CM

## 2023-03-11 HISTORY — PX: CHOLECYSTECTOMY: SHX55

## 2023-03-11 LAB — CBC
HCT: 36.7 % (ref 36.0–46.0)
Hemoglobin: 12.3 g/dL (ref 12.0–15.0)
MCH: 29.2 pg (ref 26.0–34.0)
MCHC: 33.5 g/dL (ref 30.0–36.0)
MCV: 87.2 fL (ref 80.0–100.0)
Platelets: 319 10*3/uL (ref 150–400)
RBC: 4.21 MIL/uL (ref 3.87–5.11)
RDW: 12.2 % (ref 11.5–15.5)
WBC: 12.8 10*3/uL — ABNORMAL HIGH (ref 4.0–10.5)
nRBC: 0 % (ref 0.0–0.2)

## 2023-03-11 LAB — BASIC METABOLIC PANEL
Anion gap: 7 (ref 5–15)
BUN: 7 mg/dL (ref 6–20)
CO2: 20 mmol/L — ABNORMAL LOW (ref 22–32)
Calcium: 8.4 mg/dL — ABNORMAL LOW (ref 8.9–10.3)
Chloride: 106 mmol/L (ref 98–111)
Creatinine, Ser: 0.83 mg/dL (ref 0.44–1.00)
GFR, Estimated: >60 mL/min (ref >60)
Glucose, Bld: 116 mg/dL — ABNORMAL HIGH (ref 70–99)
Potassium: 3.5 mmol/L (ref 3.5–5.1)
Sodium: 133 mmol/L — ABNORMAL LOW (ref 135–145)

## 2023-03-11 LAB — HEPATIC FUNCTION PANEL
ALT: 10 U/L (ref 0–44)
AST: 13 U/L — ABNORMAL LOW (ref 15–41)
Albumin: 3.4 g/dL — ABNORMAL LOW (ref 3.5–5.0)
Alkaline Phosphatase: 61 U/L (ref 38–126)
Bilirubin, Direct: 0.1 mg/dL (ref 0.0–0.2)
Indirect Bilirubin: 0.7 mg/dL (ref 0.3–0.9)
Total Bilirubin: 0.8 mg/dL (ref <1.2)
Total Protein: 6.1 g/dL — ABNORMAL LOW (ref 6.5–8.1)

## 2023-03-11 LAB — SURGICAL PCR SCREEN
MRSA, PCR: NEGATIVE
Staphylococcus aureus: POSITIVE — AB

## 2023-03-11 LAB — HIV ANTIBODY (ROUTINE TESTING W REFLEX): HIV Screen 4th Generation wRfx: NONREACTIVE

## 2023-03-11 SURGERY — LAPAROSCOPIC CHOLECYSTECTOMY WITH INTRAOPERATIVE CHOLANGIOGRAM
Anesthesia: General | Site: Abdomen

## 2023-03-11 MED ORDER — ACETAMINOPHEN 325 MG PO TABS
650.0000 mg | ORAL_TABLET | Freq: Four times a day (QID) | ORAL | Status: DC
  Administered 2023-03-11 – 2023-03-12 (×3): 650 mg via ORAL
  Filled 2023-03-11 (×3): qty 2

## 2023-03-11 MED ORDER — ONDANSETRON HCL 4 MG/2ML IJ SOLN: 4.0000 mg | Freq: Once | INTRAMUSCULAR | Status: DC | PRN

## 2023-03-11 MED ORDER — SODIUM CHLORIDE 0.9 % IV BOLUS
1000.0000 mL | Freq: Once | INTRAVENOUS | Status: AC
Start: 2023-03-11 — End: 2023-03-11
  Administered 2023-03-11: 1000 mL via INTRAVENOUS

## 2023-03-11 MED ORDER — SIMETHICONE 80 MG PO CHEW: 80.0000 mg | CHEWABLE_TABLET | Freq: Four times a day (QID) | ORAL | Status: DC | PRN

## 2023-03-11 MED ORDER — ONDANSETRON HCL 4 MG/2ML IJ SOLN
INTRAMUSCULAR | Status: DC | PRN
  Administered 2023-03-11: 4 mg via INTRAVENOUS

## 2023-03-11 MED ORDER — DEXAMETHASONE SODIUM PHOSPHATE 10 MG/ML IJ SOLN
INTRAMUSCULAR | Status: DC | PRN
  Administered 2023-03-11: 10 mg via INTRAVENOUS

## 2023-03-11 MED ORDER — HYDROMORPHONE HCL 1 MG/ML IJ SOLN
INTRAMUSCULAR | Status: AC
  Filled 2023-03-11: qty 1

## 2023-03-11 MED ORDER — ROCURONIUM BROMIDE 10 MG/ML (PF) SYRINGE
PREFILLED_SYRINGE | INTRAVENOUS | Status: DC | PRN
  Administered 2023-03-11: 80 mg via INTRAVENOUS

## 2023-03-11 MED ORDER — MIDAZOLAM HCL 2 MG/2ML IJ SOLN
INTRAMUSCULAR | Status: AC
  Filled 2023-03-11: qty 2

## 2023-03-11 MED ORDER — PROPOFOL 10 MG/ML IV BOLUS
INTRAVENOUS | Status: AC
  Filled 2023-03-11: qty 20

## 2023-03-11 MED ORDER — SODIUM CHLORIDE 0.9 % IV SOLN: INTRAVENOUS | Status: DC | PRN

## 2023-03-11 MED ORDER — FENTANYL CITRATE (PF) 250 MCG/5ML IJ SOLN
INTRAMUSCULAR | Status: AC
  Filled 2023-03-11: qty 5

## 2023-03-11 MED ORDER — ONDANSETRON HCL 4 MG/2ML IJ SOLN: 4.0000 mg | Freq: Four times a day (QID) | INTRAMUSCULAR | Status: DC | PRN

## 2023-03-11 MED ORDER — DOCUSATE SODIUM 100 MG PO CAPS
100.0000 mg | ORAL_CAPSULE | Freq: Two times a day (BID) | ORAL | Status: DC
  Administered 2023-03-11: 100 mg via ORAL
  Filled 2023-03-11: qty 1

## 2023-03-11 MED ORDER — HYDROMORPHONE HCL 1 MG/ML IJ SOLN
0.2500 mg | INTRAMUSCULAR | Status: DC | PRN
  Administered 2023-03-11 (×2): 0.5 mg via INTRAVENOUS

## 2023-03-11 MED ORDER — LIDOCAINE 2% (20 MG/ML) 5 ML SYRINGE
INTRAMUSCULAR | Status: DC | PRN
  Administered 2023-03-11: 100 mg via INTRAVENOUS

## 2023-03-11 MED ORDER — ACETAMINOPHEN 10 MG/ML IV SOLN
INTRAVENOUS | Status: DC | PRN
  Administered 2023-03-11: 1000 mg via INTRAVENOUS

## 2023-03-11 MED ORDER — CHLORHEXIDINE GLUCONATE 0.12 % MT SOLN
15.0000 mL | Freq: Once | OROMUCOSAL | Status: AC
  Administered 2023-03-11: 15 mL via OROMUCOSAL
  Filled 2023-03-11: qty 15

## 2023-03-11 MED ORDER — OXYCODONE HCL 5 MG PO TABS: 5.0000 mg | ORAL_TABLET | Freq: Once | ORAL | Status: DC | PRN

## 2023-03-11 MED ORDER — 0.9 % SODIUM CHLORIDE (POUR BTL) OPTIME
TOPICAL | Status: DC | PRN
  Administered 2023-03-11: 1000 mL

## 2023-03-11 MED ORDER — CHLORHEXIDINE GLUCONATE 0.12 % MT SOLN
OROMUCOSAL | Status: AC
  Filled 2023-03-11: qty 15

## 2023-03-11 MED ORDER — SUGAMMADEX SODIUM 200 MG/2ML IV SOLN
INTRAVENOUS | Status: DC | PRN
  Administered 2023-03-11: 200 mg via INTRAVENOUS

## 2023-03-11 MED ORDER — FENTANYL CITRATE (PF) 250 MCG/5ML IJ SOLN
INTRAMUSCULAR | Status: DC | PRN
  Administered 2023-03-11: 50 ug via INTRAVENOUS
  Administered 2023-03-11: 100 ug via INTRAVENOUS

## 2023-03-11 MED ORDER — OXYCODONE HCL 5 MG PO TABS
5.0000 mg | ORAL_TABLET | ORAL | Status: DC | PRN
  Filled 2023-03-11: qty 1

## 2023-03-11 MED ORDER — SODIUM CHLORIDE 0.9 % IR SOLN
Status: DC | PRN
  Administered 2023-03-11: 1

## 2023-03-11 MED ORDER — SODIUM CHLORIDE 0.9 % IV SOLN
2.0000 g | INTRAVENOUS | Status: DC
  Administered 2023-03-11: 2 g via INTRAVENOUS
  Filled 2023-03-11: qty 20

## 2023-03-11 MED ORDER — SODIUM CHLORIDE 0.9 % IV BOLUS
500.0000 mL | Freq: Once | INTRAVENOUS | Status: AC
  Administered 2023-03-11: 500 mL via INTRAVENOUS

## 2023-03-11 MED ORDER — MUPIROCIN 2 % EX OINT
1.0000 | TOPICAL_OINTMENT | Freq: Two times a day (BID) | CUTANEOUS | Status: DC
  Administered 2023-03-11 (×2): 1 via NASAL
  Filled 2023-03-11: qty 22

## 2023-03-11 MED ORDER — ACETAMINOPHEN 10 MG/ML IV SOLN
INTRAVENOUS | Status: AC
  Filled 2023-03-11: qty 100

## 2023-03-11 MED ORDER — CHLORHEXIDINE GLUCONATE CLOTH 2 % EX PADS
6.0000 | MEDICATED_PAD | Freq: Every day | CUTANEOUS | Status: DC
  Administered 2023-03-11 – 2023-03-12 (×2): 6 via TOPICAL

## 2023-03-11 MED ORDER — ONDANSETRON HCL 4 MG/2ML IJ SOLN
INTRAMUSCULAR | Status: AC
  Filled 2023-03-11: qty 2

## 2023-03-11 MED ORDER — METHOCARBAMOL 1000 MG/10ML IJ SOLN: 500.0000 mg | Freq: Four times a day (QID) | INTRAMUSCULAR | Status: DC | PRN

## 2023-03-11 MED ORDER — ORAL CARE MOUTH RINSE: 15.0000 mL | Freq: Once | OROMUCOSAL | Status: AC

## 2023-03-11 MED ORDER — BUPIVACAINE-EPINEPHRINE 0.25% -1:200000 IJ SOLN
INTRAMUSCULAR | Status: DC | PRN
  Administered 2023-03-11: 10 mL

## 2023-03-11 MED ORDER — ENOXAPARIN SODIUM 40 MG/0.4ML IJ SOSY: 40.0000 mg | PREFILLED_SYRINGE | INTRAMUSCULAR | Status: DC

## 2023-03-11 MED ORDER — DEXAMETHASONE SODIUM PHOSPHATE 10 MG/ML IJ SOLN
INTRAMUSCULAR | Status: AC
  Filled 2023-03-11: qty 1

## 2023-03-11 MED ORDER — HYDROMORPHONE HCL 1 MG/ML IJ SOLN: 0.5000 mg | INTRAMUSCULAR | Status: DC | PRN

## 2023-03-11 MED ORDER — SCOPOLAMINE 1 MG/3DAYS TD PT72
1.0000 | MEDICATED_PATCH | TRANSDERMAL | Status: DC
  Administered 2023-03-11: 1.5 mg via TRANSDERMAL
  Filled 2023-03-11: qty 1

## 2023-03-11 MED ORDER — KETOROLAC TROMETHAMINE 15 MG/ML IJ SOLN
15.0000 mg | Freq: Three times a day (TID) | INTRAMUSCULAR | Status: DC
  Administered 2023-03-11 – 2023-03-12 (×3): 15 mg via INTRAVENOUS
  Filled 2023-03-11 (×3): qty 1

## 2023-03-11 MED ORDER — PROCHLORPERAZINE EDISYLATE 10 MG/2ML IJ SOLN: 10.0000 mg | INTRAMUSCULAR | Status: DC | PRN

## 2023-03-11 MED ORDER — KETAMINE HCL 10 MG/ML IJ SOLN
INTRAMUSCULAR | Status: DC | PRN
  Administered 2023-03-11: 35 mg via INTRAVENOUS

## 2023-03-11 MED ORDER — MIDAZOLAM HCL 2 MG/2ML IJ SOLN
INTRAMUSCULAR | Status: DC | PRN
  Administered 2023-03-11: 2 mg via INTRAVENOUS

## 2023-03-11 MED ORDER — LIDOCAINE 2% (20 MG/ML) 5 ML SYRINGE
INTRAMUSCULAR | Status: AC
  Filled 2023-03-11: qty 5

## 2023-03-11 MED ORDER — SODIUM CHLORIDE 0.9 % IV SOLN: 1.0000 g | Freq: Once | INTRAVENOUS | Status: DC

## 2023-03-11 MED ORDER — PROPOFOL 10 MG/ML IV BOLUS
INTRAVENOUS | Status: DC | PRN
  Administered 2023-03-11: 200 mg via INTRAVENOUS

## 2023-03-11 MED ORDER — DROPERIDOL 2.5 MG/ML IJ SOLN: 0.6250 mg | Freq: Once | INTRAMUSCULAR | Status: DC | PRN

## 2023-03-11 MED ORDER — KETAMINE HCL 50 MG/5ML IJ SOSY
PREFILLED_SYRINGE | INTRAMUSCULAR | Status: AC
  Filled 2023-03-11: qty 5

## 2023-03-11 MED ORDER — PHENYLEPHRINE 80 MCG/ML (10ML) SYRINGE FOR IV PUSH (FOR BLOOD PRESSURE SUPPORT)
PREFILLED_SYRINGE | INTRAVENOUS | Status: DC | PRN
  Administered 2023-03-11 (×2): 80 ug via INTRAVENOUS

## 2023-03-11 MED ORDER — ROCURONIUM BROMIDE 10 MG/ML (PF) SYRINGE
PREFILLED_SYRINGE | INTRAVENOUS | Status: AC
  Filled 2023-03-11: qty 10

## 2023-03-11 MED ORDER — OXYCODONE HCL 5 MG PO TABS
10.0000 mg | ORAL_TABLET | ORAL | Status: DC | PRN
  Administered 2023-03-11: 10 mg via ORAL
  Filled 2023-03-11: qty 2

## 2023-03-11 MED ORDER — LACTATED RINGERS IV SOLN: INTRAVENOUS | Status: DC

## 2023-03-11 MED ORDER — SODIUM CHLORIDE 0.9 % IV SOLN
2.0000 g | INTRAVENOUS | Status: AC
  Administered 2023-03-12: 2 g via INTRAVENOUS
  Filled 2023-03-11: qty 20

## 2023-03-11 MED ORDER — GABAPENTIN 300 MG PO CAPS
300.0000 mg | ORAL_CAPSULE | Freq: Three times a day (TID) | ORAL | Status: DC
  Administered 2023-03-11: 300 mg via ORAL
  Filled 2023-03-11: qty 1

## 2023-03-11 MED ORDER — BUPIVACAINE-EPINEPHRINE (PF) 0.25% -1:200000 IJ SOLN
INTRAMUSCULAR | Status: AC
  Filled 2023-03-11: qty 30

## 2023-03-11 MED ORDER — OXYCODONE HCL 5 MG/5ML PO SOLN: 5.0000 mg | Freq: Once | ORAL | Status: DC | PRN

## 2023-03-11 SURGICAL SUPPLY — 41 items
APPLIER CLIP ROT 10 11.4 M/L (STAPLE) ×1 IMPLANT
BAG COUNTER SPONGE SURGICOUNT (BAG) ×1 IMPLANT
BENZOIN TINCTURE PRP APPL 2/3 (GAUZE/BANDAGES/DRESSINGS) ×1 IMPLANT
BLADE CLIPPER SURG (BLADE) IMPLANT
CANISTER SUCT 3000ML PPV (MISCELLANEOUS) ×1 IMPLANT
CHLORAPREP W/TINT 26 (MISCELLANEOUS) ×1 IMPLANT
CLIP APPLIE ROT 10 11.4 M/L (STAPLE) ×1 IMPLANT
COVER MAYO STAND STRL (DRAPES) ×1 IMPLANT
COVER SURGICAL LIGHT HANDLE (MISCELLANEOUS) ×1 IMPLANT
DRAPE C-ARM 42X120 X-RAY (DRAPES) ×1 IMPLANT
DRSG TEGADERM 2-3/8X2-3/4 SM (GAUZE/BANDAGES/DRESSINGS) ×3 IMPLANT
DRSG TEGADERM 4X4.75 (GAUZE/BANDAGES/DRESSINGS) ×1 IMPLANT
ELECT REM PT RETURN 9FT ADLT (ELECTROSURGICAL) ×1 IMPLANT
ELECTRODE REM PT RTRN 9FT ADLT (ELECTROSURGICAL) ×1 IMPLANT
GAUZE SPONGE 2X2 8PLY STRL LF (GAUZE/BANDAGES/DRESSINGS) ×1 IMPLANT
GAUZE SPONGE 2X2 STRL 8-PLY (GAUZE/BANDAGES/DRESSINGS) IMPLANT
GLOVE BIO SURGEON STRL SZ7 (GLOVE) ×1 IMPLANT
GLOVE BIOGEL PI IND STRL 7.5 (GLOVE) ×1 IMPLANT
GOWN STRL REUS W/ TWL LRG LVL3 (GOWN DISPOSABLE) ×3 IMPLANT
IRRIG SUCT STRYKERFLOW 2 WTIP (MISCELLANEOUS) ×1 IMPLANT
IRRIGATION SUCT STRKRFLW 2 WTP (MISCELLANEOUS) ×1 IMPLANT
KIT BASIN OR (CUSTOM PROCEDURE TRAY) ×1 IMPLANT
KIT TURNOVER KIT B (KITS) ×1 IMPLANT
NS IRRIG 1000ML POUR BTL (IV SOLUTION) ×1 IMPLANT
PAD ARMBOARD 7.5X6 YLW CONV (MISCELLANEOUS) ×1 IMPLANT
POUCH RETRIEVAL ECOSAC 10 (ENDOMECHANICALS) IMPLANT
SCISSORS LAP 5X35 DISP (ENDOMECHANICALS) ×1 IMPLANT
SET CHOLANGIOGRAPH 5 50 .035 (SET/KITS/TRAYS/PACK) ×1 IMPLANT
SET TUBE SMOKE EVAC HIGH FLOW (TUBING) ×1 IMPLANT
SLEEVE Z-THREAD 5X100MM (TROCAR) ×1 IMPLANT
SPECIMEN JAR SMALL (MISCELLANEOUS) ×1 IMPLANT
STRIP CLOSURE SKIN 1/2X4 (GAUZE/BANDAGES/DRESSINGS) ×1 IMPLANT
SUT MNCRL AB 4-0 PS2 18 (SUTURE) ×1 IMPLANT
TOWEL GREEN STERILE (TOWEL DISPOSABLE) ×1 IMPLANT
TOWEL GREEN STERILE FF (TOWEL DISPOSABLE) ×1 IMPLANT
TRAY LAPAROSCOPIC MC (CUSTOM PROCEDURE TRAY) ×1 IMPLANT
TROCAR 11X100 Z THREAD (TROCAR) ×1 IMPLANT
TROCAR BALLN 12MMX100 BLUNT (TROCAR) ×1 IMPLANT
TROCAR Z-THREAD OPTICAL 5X100M (TROCAR) ×1 IMPLANT
WARMER LAPAROSCOPE (MISCELLANEOUS) ×1 IMPLANT
WATER STERILE IRR 1000ML POUR (IV SOLUTION) ×1 IMPLANT

## 2023-03-11 NOTE — Transfer of Care (Signed)
Immediate Anesthesia Transfer of Care Note  Patient: Rachel Stuart  Procedure(s) Performed: LAPAROSCOPIC CHOLECYSTECTOMY (Abdomen)  Patient Location: PACU  Anesthesia Type:General  Level of Consciousness: awake and patient cooperative  Airway & Oxygen Therapy: Patient Spontanous Breathing and Patient connected to face mask oxygen  Post-op Assessment: Report given to RN and Post -op Vital signs reviewed and stable  Post vital signs: Reviewed and stable  Last Vitals:  Vitals Value Taken Time  BP 140/84 03/11/23 1631  Temp 37.5 C 03/11/23 1555  Pulse 69 03/11/23 1636  Resp 15 03/11/23 1636  SpO2 96 % 03/11/23 1636  Vitals shown include unfiled device data.  Last Pain:  Vitals:   03/11/23 1630  TempSrc:   PainSc: 7       Patients Stated Pain Goal: 0 (03/11/23 1207)  Complications: No notable events documented.

## 2023-03-11 NOTE — Anesthesia Preprocedure Evaluation (Addendum)
Anesthesia Evaluation  Patient identified by MRN, date of birth, ID band Patient awake    Reviewed: Allergy & Precautions, NPO status , Patient's Chart, lab work & pertinent test results  Airway Mallampati: II  TM Distance: >3 FB     Dental  (+) Dental Advisory Given, Poor Dentition, Chipped, Missing   Pulmonary neg pulmonary ROS   Pulmonary exam normal breath sounds clear to auscultation       Cardiovascular negative cardio ROS Normal cardiovascular exam Rhythm:Regular Rate:Normal     Neuro/Psych  PSYCHIATRIC DISORDERS Anxiety Depression    negative neurological ROS     GI/Hepatic Neg liver ROS,,,Cholelithiasis with acute cholecystitis   Endo/Other  Obesity  Renal/GU negative Renal ROS  negative genitourinary   Musculoskeletal negative musculoskeletal ROS (+)    Abdominal  (+) + obese Abdomen: tender.   Peds  Hematology negative hematology ROS (+)   Anesthesia Other Findings   Reproductive/Obstetrics                             Anesthesia Physical Anesthesia Plan  ASA: 2  Anesthesia Plan: General   Post-op Pain Management: Minimal or no pain anticipated   Induction: Intravenous and Cricoid pressure planned  PONV Risk Score and Plan: 4 or greater and Treatment may vary due to age or medical condition, Ondansetron, Dexamethasone, Scopolamine patch - Pre-op and Midazolam  Airway Management Planned: Oral ETT  Additional Equipment: None  Intra-op Plan:   Post-operative Plan: Extubation in OR  Informed Consent: I have reviewed the patients History and Physical, chart, labs and discussed the procedure including the risks, benefits and alternatives for the proposed anesthesia with the patient or authorized representative who has indicated his/her understanding and acceptance.     Dental advisory given  Plan Discussed with: CRNA and Anesthesiologist  Anesthesia Plan Comments:          Anesthesia Quick Evaluation

## 2023-03-11 NOTE — Progress Notes (Addendum)
[facility] Pre-op Review 1.Surgical orders: Consent orders:   Consent signed:  Consent completed Patient alert and oriented:  A/O Antibiotic: Rocephin 2 GRAMS [id number] Pre-meds:  2.  Pre-procedure checklist completed: Request floor RN to complete  3.  NPO:  Waiting Floor RN chat response  4.  CHG Bath completed:  Request Floor RN to complete      Belongings removed and placed in clean gown:  5.  Labs Performed: CBC    Abnormal 03/11/23 CMP/BMP   Abnormal 03/11/23 PT/INR HA1C Type and Screen Surgical PCR:  Abnormal 03/11/23 Staph + Pregnancy:  Ordered urgent EKG:  Chest x-ray:   6.  Recent H&P or progress note if inpatient: 03/10/23  7.  Language Barrier:    8.  Vital Signs:  Abnormal 03/11/23 Oxygen:  RA Tele:  Can the patient travel without Tele  9.  Medications: Cardiac Drips:  NA Pain Medications: Toradol 15mg  IV 0500 Beta Blocker:   NA Anticoagulants:  Lovenox held the morning GLP1:   NA  10. IV access:  20 Left AC  11. Diabetic:     NA

## 2023-03-11 NOTE — ED Notes (Signed)
Pt transported to US

## 2023-03-11 NOTE — Plan of Care (Signed)
  Problem: Education: Goal: Knowledge of General Education information will improve Description Including pain rating scale, medication(s)/side effects and non-pharmacologic comfort measures Outcome: Progressing   Problem: Education: Goal: Knowledge of General Education information will improve Description Including pain rating scale, medication(s)/side effects and non-pharmacologic comfort measures Outcome: Progressing   

## 2023-03-11 NOTE — Discharge Instructions (Signed)

## 2023-03-11 NOTE — ED Notes (Signed)
ED TO INPATIENT HANDOFF REPORT  ED Nurse Name and Phone #: Gillis Ends RN   S Name/Age/Gender Rachel Stuart 27 y.o. female Room/Bed: 032C/032C  Code Status   Code Status: Full Code  Home/SNF/Other Home Patient oriented to: self, place, time, and situation Is this baseline? Yes   Triage Complete: Triage complete  Chief Complaint Acute cholecystitis [K81.0]  Triage Note Pt is coming in for multiple complaints, one being upper lumbar pain that she states it not the usually lower lumbar pain she experiences. She also has some abd pain as well that is causing her nausea and vomiting. She mentions that she has taken all of her pain medications at home but none are working because she has vomited up the medications. She is curled up in a ball in triage moaning in pain and crying at this time.    Allergies Allergies  Allergen Reactions   Amoxicillin Swelling    Lips swell Has patient had a PCN reaction causing immediate rash, facial/tongue/throat swelling, SOB or lightheadedness with hypotension: yes Has patient had a PCN reaction causing severe rash involving mucus membranes or skin necrosis: no Has patient had a PCN reaction that required hospitalization: no Has patient had a PCN reaction occurring within the last 10 years: no If all of the above answers are "NO", then may proceed with Cephalosporin use. CAN TAKE KEFLEX     Level of Care/Admitting Diagnosis ED Disposition     ED Disposition  Admit   Condition  --   Comment  Hospital Area: [facility] [place] [[id number]]  Level of Care: Med-Surg [16]  May admit patient to [facility] or [facility] if equivalent level of care is available:: No  Covid Evaluation: Asymptomatic - no recent exposure (last 10 days) testing not required  Diagnosis: Acute cholecystitis [575.0.ICD-9-CM]  Admitting Physician: CCS, MD [[id number]]  Attending Physician: CCS, MD [[id number]]  Certification:: I certify this patient will need inpatient  services for at least 2 midnights  Expected Medical Readiness: 03/13/2023          B Medical/Surgery History Past Medical History:  Diagnosis Date   Ankle fracture    Anxiety    Depression    zoloft   Obesity    Ovarian cyst    Past Surgical History:  Procedure Laterality Date   NO PAST SURGERIES       A IV Location/Drains/Wounds Patient Lines/Drains/Airways Status     Active Line/Drains/Airways     Name Placement date Placement time Site Days   Peripheral IV 03/10/23 20 G Left Antecubital 03/10/23  1751  Antecubital  1   Epidural Catheter 10/29/20 10/29/20  1126  -- 863            Intake/Output Last 24 hours No intake or output data in the 24 hours ending 03/11/23 0404  Labs/Imaging Results for orders placed or performed during the hospital encounter of 03/10/23 (from the past 48 hour(s))  Urinalysis, Routine w reflex microscopic -Urine, Clean Catch     Status: Abnormal   Collection Time: 03/10/23  5:16 PM  Result Value Ref Range   Color, Urine YELLOW YELLOW   APPearance HAZY (A) CLEAR   Specific Gravity, Urine 1.019 1.005 - 1.030   pH 6.0 5.0 - 8.0   Glucose, UA NEGATIVE NEGATIVE mg/dL   Hgb urine dipstick NEGATIVE NEGATIVE   Bilirubin Urine NEGATIVE NEGATIVE   Ketones, ur NEGATIVE NEGATIVE mg/dL   Protein, ur NEGATIVE NEGATIVE mg/dL   Nitrite POSITIVE (A) NEGATIVE  Leukocytes,Ua SMALL (A) NEGATIVE   RBC / HPF 0-5 0 - 5 RBC/hpf   WBC, UA 11-20 0 - 5 WBC/hpf   Bacteria, UA MANY (A) NONE SEEN   Squamous Epithelial / HPF 0-5 0 - 5 /HPF   Mucus PRESENT     Comment: Performed at [facility], [address], [place], [place] [address]  Lipase, blood     Status: None   Collection Time: 03/10/23  5:35 PM  Result Value Ref Range   Lipase 39 11 - 51 U/L    Comment: Performed at [facility], [address], [place], [place] [address]  Comprehensive metabolic panel     Status: Abnormal   Collection Time: 03/10/23  5:35 PM  Result Value Ref  Range   Sodium 138 135 - 145 mmol/L   Potassium 3.8 3.5 - 5.1 mmol/L   Chloride 106 98 - 111 mmol/L   CO2 24 22 - 32 mmol/L   Glucose, Bld 101 (H) 70 - 99 mg/dL    Comment: Glucose reference range applies only to samples taken after fasting for at least 8 hours.   BUN 5 (L) 6 - 20 mg/dL   Creatinine, Ser 6.96 0.44 - 1.00 mg/dL   Calcium 9.4 8.9 - 29.5 mg/dL   Total Protein 7.3 6.5 - 8.1 g/dL   Albumin 4.1 3.5 - 5.0 g/dL   AST 15 15 - 41 U/L   ALT 13 0 - 44 U/L   Alkaline Phosphatase 71 38 - 126 U/L   Total Bilirubin 0.4 <1.2 mg/dL   GFR, Estimated >28 >41 mL/min    Comment: (NOTE) Calculated using the CKD-EPI Creatinine Equation (2021)    Anion gap 8 5 - 15    Comment: Performed at [facility], [address] [place], [place] [address]  CBC     Status: Abnormal   Collection Time: 03/10/23  5:35 PM  Result Value Ref Range   WBC 12.1 (H) 4.0 - 10.5 K/uL   RBC 4.76 3.87 - 5.11 MIL/uL   Hemoglobin 14.1 12.0 - 15.0 g/dL   HCT 10.2 72.5 - 36.6 %   MCV 87.0 80.0 - 100.0 fL   MCH 29.6 26.0 - 34.0 pg   MCHC 34.1 30.0 - 36.0 g/dL   RDW 44.0 34.7 - 42.5 %   Platelets 379 150 - 400 K/uL   nRBC 0.0 0.0 - 0.2 %    Comment: Performed at [facility], [address] [place], [place] [address]  hCG, serum, qualitative     Status: None   Collection Time: 03/10/23  5:35 PM  Result Value Ref Range   Preg, Serum NEGATIVE NEGATIVE    Comment:        THE SENSITIVITY OF THIS METHODOLOGY IS >10 mIU/mL. Performed at [facility], [address] [place], [place] [address]    US Abdomen Limited RUQ (LIVER/GB)  Result Date: 03/11/2023 CLINICAL DATA:  Right upper quadrant pain EXAM: ULTRASOUND ABDOMEN LIMITED RIGHT UPPER QUADRANT COMPARISON:  CT 03/10/2023 FINDINGS: Gallbladder: 1.3 cm gallstone in the neck of the gallbladder, non mobile. Gallbladder wall thickening at 5 mm. Negative sonographic Murphy's sign. Common bile duct: Diameter: Normal caliber, 3 mm Liver: No focal  lesion identified. Within normal limits in parenchymal echogenicity. Portal vein is patent on color Doppler imaging with normal direction of blood flow towards the liver. Other: None. IMPRESSION: 1.3 cm gallstone lodged in the gallbladder neck. Mild gallbladder wall  thickening without sonographic Murphy sign. Electronically Signed   By: Charlett Nose M.D.   On: 03/11/2023 02:38   CT ABDOMEN PELVIS W CONTRAST  Result Date: 03/10/2023 CLINICAL DATA:  Abdominal pain. EXAM: CT ABDOMEN AND PELVIS WITH CONTRAST TECHNIQUE: Multidetector CT imaging of the abdomen and pelvis was performed using the standard protocol following bolus administration of intravenous contrast. RADIATION DOSE REDUCTION: This exam was performed according to the departmental dose-optimization program which includes automated exposure control, adjustment of the mA and/or kV according to patient size and/or use of iterative reconstruction technique. CONTRAST:  75mL OMNIPAQUE IOHEXOL 350 MG/ML SOLN COMPARISON:  July 27, 2021 FINDINGS: Lower chest: No acute abnormality. Hepatobiliary: No focal liver abnormality is seen. The gallbladder is moderately distended, without evidence of gallstones, gallbladder wall thickening, or biliary dilatation. Pancreas: Unremarkable. No pancreatic ductal dilatation or surrounding inflammatory changes. Spleen: Normal in size without focal abnormality. Adrenals/Urinary Tract: Adrenal glands are unremarkable. Kidneys are normal, without renal calculi, focal lesion, or hydronephrosis. Bladder is unremarkable. Stomach/Bowel: Stomach is within normal limits. Appendix appears normal. No evidence of bowel wall thickening, distention, or inflammatory changes. Vascular/Lymphatic: No significant vascular findings are present. Numerous subcentimeter mesenteric lymph nodes are seen scattered throughout the abdomen. This is most notable within the mid and lower right abdomen. Reproductive: Uterus and bilateral adnexa are  unremarkable. Other: A 2.8 cm x 2.2 cm x 2.2 cm fat containing umbilical hernia is seen. No abdominopelvic ascites. Musculoskeletal: A 2.9 cm x 3.9 cm x 3.1 cm thin walled fluid collection (approximately 19.88 Hounsfield units) is seen along the posterior aspect of the labia on the left. There is stable grade 1 anterolisthesis of the L5 vertebral body on S1. IMPRESSION: 1. 2.9 cm x 3.9 cm x 3.1 cm fluid collection along the posterior aspect of the labia on the left, which may represent a Bartholin's gland cyst. Correlation with physical examination is recommended. 2. Numerous subcentimeter mesenteric lymph nodes scattered throughout the abdomen, which may represent mesenteric adenitis. 3. Fat-containing umbilical hernia. 4. Stable grade 1 anterolisthesis of the L5 vertebral body on S1. Electronically Signed   By: Aram Candela M.D.   On: 03/10/2023 21:10    Pending Labs Unresulted Labs (From admission, onward)     Start     Ordered   03/18/23 0500  Creatinine, serum  (enoxaparin (LOVENOX)    CrCl >/= 30 ml/min)  Weekly,   R     Comments: while on enoxaparin therapy    03/11/23 0310   03/11/23 0500  Basic metabolic panel  Daily,   R      03/11/23 0310   03/11/23 0500  CBC  Daily,   R      03/11/23 0310   03/11/23 0500  Hepatic function panel  Daily,   R      03/11/23 0310   03/11/23 0309  CBC  (enoxaparin (LOVENOX)    CrCl >/= 30 ml/min)  Once,   R       Comments: Baseline for enoxaparin therapy IF NOT ALREADY DRAWN.  Notify MD if PLT < 100 K.    03/11/23 0310   03/11/23 0308  HIV Antibody (routine testing w rflx)  (HIV Antibody (Routine testing w reflex) panel)  Once,   R        03/11/23 0310   03/11/23 0159  Urine Culture  Add-on,   AD       Question:  Indication  Answer:  Urgency/frequency   03/11/23 0158  Vitals/Pain Today's Vitals   03/10/23 1714 03/10/23 1738 03/11/23 0102 03/11/23 0329  BP:   116/68   Pulse:   (!) 59   Resp:   18   Temp:   99.3 F (37.4 C)    TempSrc:   Oral   SpO2:   98%   PainSc: 10-Worst pain ever 10-Worst pain ever  0-No pain    Isolation Precautions No active isolations  Medications Medications  enoxaparin (LOVENOX) injection 40 mg (has no administration in time range)  lactated ringers infusion ( Intravenous New Bag/Given 03/11/23 0404)  cefTRIAXone (ROCEPHIN) 2 g in sodium chloride 0.9 % 100 mL IVPB (0 g Intravenous Stopped 03/11/23 0404)  acetaminophen (TYLENOL) tablet 650 mg (has no administration in time range)  gabapentin (NEURONTIN) capsule 300 mg (has no administration in time range)  ketorolac (TORADOL) 15 MG/ML injection 15 mg (has no administration in time range)  oxyCODONE (Oxy IR/ROXICODONE) immediate release tablet 5 mg (has no administration in time range)  oxyCODONE (Oxy IR/ROXICODONE) immediate release tablet 10 mg (has no administration in time range)  HYDROmorphone (DILAUDID) injection 0.5 mg (has no administration in time range)  methocarbamol (ROBAXIN) injection 500 mg (has no administration in time range)  ondansetron (ZOFRAN) injection 4 mg (has no administration in time range)  prochlorperazine (COMPAZINE) injection 10 mg (has no administration in time range)  simethicone (MYLICON) chewable tablet 80 mg (has no administration in time range)  docusate sodium (COLACE) capsule 100 mg (has no administration in time range)  ondansetron (ZOFRAN-ODT) disintegrating tablet 4 mg (4 mg Oral Given 03/10/23 1741)  HYDROcodone-acetaminophen (NORCO/VICODIN) 5-325 MG per tablet 1 tablet (1 tablet Oral Given 03/10/23 1739)  iohexol (OMNIPAQUE) 350 MG/ML injection 75 mL (75 mLs Intravenous Contrast Given 03/10/23 2035)    Mobility walks     Focused Assessments Abd PAin   R Recommendations: See Admitting Provider Note  Report given to:   Additional Notes: n/a

## 2023-03-11 NOTE — Progress Notes (Signed)
   03/11/23 0452  Assess: MEWS Score  Temp 98.3 F (36.8 C)  BP (!) 80/40  MAP (mmHg) (!) 53  Pulse Rate (!) 56  Resp 18  SpO2 97 %  O2 Device Room Air  Assess: MEWS Score  MEWS Temp 0  MEWS Systolic 2  MEWS Pulse 0  MEWS RR 0  MEWS LOC 0  MEWS Score 2  MEWS Score Color Yellow  Assess: if the MEWS score is Yellow or Red  Were vital signs accurate and taken at a resting state? Yes  Does the patient meet 2 or more of the SIRS criteria? No  MEWS guidelines implemented  Yes, yellow  Treat  MEWS Interventions Considered administering scheduled or prn medications/treatments as ordered  Take Vital Signs  Increase Vital Sign Frequency  Yellow: Q2hr x1, continue Q4hrs until patient remains green for 12hrs  Escalate  MEWS: Escalate Yellow: Discuss with charge nurse and consider notifying provider and/or RRT  Notify: Charge Nurse/RN  Name of Charge Nurse/RN Notified Development worker, international aid  Provider Notification  Provider Name/Title Dr. Ivar Drape  Date Provider Notified 03/11/23  Time Provider Notified 0510  Method of Notification Page  Notification Reason Change in status  Provider response See new orders (1Liter of bolus  Normal Saline)  Date of Provider Response 03/11/23  Time of Provider Response 0512  Assess: SIRS CRITERIA  SIRS Temperature  0  SIRS Pulse 0  SIRS Respirations  0  SIRS WBC 0  SIRS Score Sum  0

## 2023-03-11 NOTE — ED Provider Notes (Signed)
[place] EMERGENCY DEPARTMENT AT [facility] Provider Note   CSN: [id number] Arrival date & time: 03/10/23  1630     History Chief Complaint  Patient presents with   Abdominal Pain    Rachel Stuart is a 27 y.o. female with history of degenerative disc disease presents emerged from today for evaluation of abdominal pain.  Patient reports that it starts in her right upper quadrant and does radiate some to her back.  She was having nausea and vomiting with started yesterday but has been gradually improving today.  She reports that she had 6 episodes of nonbloody, nonbilious emesis and 1 episode today.  She denies any vaginal symptoms of discharge or bleeding.  Denies any dysuria or hematuria reports that she is been having some urgency and frequency for the past week.  She denies any fever or any flulike symptoms.  Denies any urinary fecal incontinence.  She denies any chest pain or any shortness of breath.  She denies any diarrhea, constipation, hematochezia, or melena.  Denies any saddle anesthesia, or history of IV drug use.  No medications taken prior to arrival.  She denies any surgeries to her belly.  She is currently on gabapentin, hydrocodone, and tizanidine.  Allergic to amoxicillin.  She vapes daily.  Denies any EtOH or illicit drug use.   Abdominal Pain Associated symptoms: no chest pain, no chills, no dysuria, no fever, no hematuria, no shortness of breath, no vaginal bleeding and no vaginal discharge        Home Medications Prior to Admission medications   Medication Sig Start Date End Date Taking? Authorizing Provider  acetaminophen (TYLENOL) 500 MG tablet Take 500 mg by mouth every 6 (six) hours as needed for mild pain or headache.     [provider]  HYDROcodone-acetaminophen (NORCO/VICODIN) 5-325 MG tablet Take 1 tablet by mouth every 4 (four) hours as needed. 07/27/21   Sloan Leiter, DO  ibuprofen (ADVIL) 600 MG tablet Take 1 tablet (600 mg total) by  mouth every 6 (six) hours as needed. 12/05/20   Venora Maples, MD  ibuprofen (ADVIL) 600 MG tablet Take 1 tablet (600 mg total) by mouth every 6 (six) hours as needed. 07/27/21   Tanda Rockers A, DO  lidocaine (LIDODERM) 5 % Place 1 patch onto the skin daily. Remove & Discard patch within 12 hours or as directed by MD 07/19/22   Raspet, Noberto Retort, PA-C  methocarbamol (ROBAXIN) 500 MG tablet Take 1 tablet (500 mg total) by mouth 2 (two) times daily. 07/19/22   Raspet, Noberto Retort, PA-C  ondansetron (ZOFRAN) 4 MG tablet Take 1 tablet (4 mg total) by mouth every 4 (four) hours as needed for nausea or vomiting. 07/27/21   Sloan Leiter, DO  predniSONE (STERAPRED UNI-PAK 21 TAB) 10 MG (21) TBPK tablet As directed 07/19/22   Raspet, Noberto Retort, PA-C  Prenatal Vit-Fe Fumarate-FA (PRENATAL MULTIVITAMIN) TABS tablet Take 1 tablet by mouth daily at 12 noon. 10/31/20   Worthy Rancher, MD      Allergies    Amoxicillin    Review of Systems   Review of Systems  Constitutional:  Negative for chills and fever.  Respiratory:  Negative for shortness of breath.   Cardiovascular:  Negative for chest pain.  Gastrointestinal:  Positive for abdominal pain.  Genitourinary:  Positive for frequency and urgency. Negative for dysuria, hematuria, vaginal bleeding, vaginal discharge and vaginal pain.    Physical Exam Updated Vital Signs BP 116/68 (BP  Location: Right Arm)   Pulse (!) 59   Temp 99.3 F (37.4 C) (Oral)   Resp 18   SpO2 98%  Physical Exam Vitals and nursing note reviewed.  Constitutional:      General: She is not in acute distress.    Appearance: She is not ill-appearing or toxic-appearing.     Comments: Sleeping, but awakens to touch  Cardiovascular:     Rate and Rhythm: Normal rate.  Pulmonary:     Effort: Pulmonary effort is normal. No respiratory distress.  Abdominal:     Palpations: Abdomen is soft.     Tenderness: There is abdominal tenderness in the right upper quadrant. There is no right CVA  tenderness, left CVA tenderness, guarding or rebound. Positive signs include Murphy's sign.  Musculoskeletal:     Comments: No midline or paraspinal cervical, thoracic, or lumbar tenderness palpation.  Skin:    General: Skin is warm and dry.  Neurological:     Mental Status: She is alert.     ED Results / Procedures / Treatments   Labs (all labs ordered are listed, but only abnormal results are displayed) Labs Reviewed  COMPREHENSIVE METABOLIC PANEL - Abnormal; Notable for the following components:      Result Value   Glucose, Bld 101 (*)    BUN 5 (*)    All other components within normal limits  CBC - Abnormal; Notable for the following components:   WBC 12.1 (*)    All other components within normal limits  URINALYSIS, ROUTINE W REFLEX MICROSCOPIC - Abnormal; Notable for the following components:   APPearance HAZY (*)    Nitrite POSITIVE (*)    Leukocytes,Ua SMALL (*)    Bacteria, UA MANY (*)    All other components within normal limits  LIPASE, BLOOD  HCG, SERUM, QUALITATIVE    EKG None  Radiology US Abdomen Limited RUQ (LIVER/GB)  Result Date: 03/11/2023 CLINICAL DATA:  Right upper quadrant pain EXAM: ULTRASOUND ABDOMEN LIMITED RIGHT UPPER QUADRANT COMPARISON:  CT 03/10/2023 FINDINGS: Gallbladder: 1.3 cm gallstone in the neck of the gallbladder, non mobile. Gallbladder wall thickening at 5 mm. Negative sonographic Murphy's sign. Common bile duct: Diameter: Normal caliber, 3 mm Liver: No focal lesion identified. Within normal limits in parenchymal echogenicity. Portal vein is patent on color Doppler imaging with normal direction of blood flow towards the liver. Other: None. IMPRESSION: 1.3 cm gallstone lodged in the gallbladder neck. Mild gallbladder wall thickening without sonographic Murphy sign. Electronically Signed   By: Charlett Nose M.D.   On: 03/11/2023 02:38   CT ABDOMEN PELVIS W CONTRAST  Result Date: 03/10/2023 CLINICAL DATA:  Abdominal pain. EXAM: CT ABDOMEN  AND PELVIS WITH CONTRAST TECHNIQUE: Multidetector CT imaging of the abdomen and pelvis was performed using the standard protocol following bolus administration of intravenous contrast. RADIATION DOSE REDUCTION: This exam was performed according to the departmental dose-optimization program which includes automated exposure control, adjustment of the mA and/or kV according to patient size and/or use of iterative reconstruction technique. CONTRAST:  75mL OMNIPAQUE IOHEXOL 350 MG/ML SOLN COMPARISON:  July 27, 2021 FINDINGS: Lower chest: No acute abnormality. Hepatobiliary: No focal liver abnormality is seen. The gallbladder is moderately distended, without evidence of gallstones, gallbladder wall thickening, or biliary dilatation. Pancreas: Unremarkable. No pancreatic ductal dilatation or surrounding inflammatory changes. Spleen: Normal in size without focal abnormality. Adrenals/Urinary Tract: Adrenal glands are unremarkable. Kidneys are normal, without renal calculi, focal lesion, or hydronephrosis. Bladder is unremarkable. Stomach/Bowel: Stomach is within normal  limits. Appendix appears normal. No evidence of bowel wall thickening, distention, or inflammatory changes. Vascular/Lymphatic: No significant vascular findings are present. Numerous subcentimeter mesenteric lymph nodes are seen scattered throughout the abdomen. This is most notable within the mid and lower right abdomen. Reproductive: Uterus and bilateral adnexa are unremarkable. Other: A 2.8 cm x 2.2 cm x 2.2 cm fat containing umbilical hernia is seen. No abdominopelvic ascites. Musculoskeletal: A 2.9 cm x 3.9 cm x 3.1 cm thin walled fluid collection (approximately 19.88 Hounsfield units) is seen along the posterior aspect of the labia on the left. There is stable grade 1 anterolisthesis of the L5 vertebral body on S1. IMPRESSION: 1. 2.9 cm x 3.9 cm x 3.1 cm fluid collection along the posterior aspect of the labia on the left, which may represent a  Bartholin's gland cyst. Correlation with physical examination is recommended. 2. Numerous subcentimeter mesenteric lymph nodes scattered throughout the abdomen, which may represent mesenteric adenitis. 3. Fat-containing umbilical hernia. 4. Stable grade 1 anterolisthesis of the L5 vertebral body on S1. Electronically Signed   By: Aram Candela M.D.   On: 03/10/2023 21:10    Procedures Procedures   Medications Ordered in ED Medications  cefTRIAXone (ROCEPHIN) 1 g in sodium chloride 0.9 % 100 mL IVPB (has no administration in time range)  ondansetron (ZOFRAN-ODT) disintegrating tablet 4 mg (4 mg Oral Given 03/10/23 1741)  HYDROcodone-acetaminophen (NORCO/VICODIN) 5-325 MG per tablet 1 tablet (1 tablet Oral Given 03/10/23 1739)  iohexol (OMNIPAQUE) 350 MG/ML injection 75 mL (75 mLs Intravenous Contrast Given 03/10/23 2035)    ED Course/ Medical Decision Making/ A&P                               Medical Decision Making Amount and/or Complexity of Data Reviewed Labs: ordered. Radiology: ordered.  Risk Decision regarding hospitalization.   27 y.o. female presents to the ER for evaluation of RUQ abdominal pain. Differential diagnosis includes but is not limited to PUD, GERD, gastritis, pancreatitis, gastroparesis, malignancy, biliary disease, ACS, pericarditis, pneumonia, intestinal ischemia, esophageal rupture, hepatitis, pregnancy. Vital signs unremarkable. Physical exam as noted above.   Labs and imaging ordered in triage.   I independently reviewed and interpreted the patient's labs.  Pregnancy test is negative.  CBC shows mild cytosis of 12.1.  No anemia.  CMP shows mildly elevated Leukos of 101.  BUN at 5.  Otherwise, no electrolyte or LFT abnormality.  Lipase within normal limits.  Urinalysis shows hazy urine with positive nitrates, small decides, 1120 white blood cells and many bacteria.  Consistent with a UTI.  Will order urine culture.  CT imaging shows 1. 2.9 cm x 3.9 cm x 3.1  cm fluid collection along the posterior aspect of the labia on the left, which may represent a Bartholin's gland cyst. Correlation with physical examination is recommended. 2. Numerous subcentimeter mesenteric lymph nodes scattered throughout the abdomen, which may represent mesenteric adenitis. 3. Fat-containing umbilical hernia. 4. Stable grade 1 anterolisthesis of the L5 vertebral body on S1.   On my examination, the patient is having some right upper quadrant tenderness palpation.  Soft.  No guarding or rebound present.  No CVA tenderness.  She is not having any paraspinal or midline tenderness to the back.  She appears comfortable and in no acute distress.  Given the CT findings, I did discuss with her about the potential Bartholin cyst.  She denies any pain or growth to her vagina.  She does have some right upper quadrant tenderness.  No chest pain or shortness of breath.  CT imaging mentions that she did have some mild gallbladder distention.  Given this in the setting of right upper quadrant pain as well as nausea and vomiting, will order right upper quadrant ultrasound for further clarification.  The patient does not appear in any acute distress.  She was given Zofran and Norco prior to being placed in an ER room. Urine shows signs of infection, I have ordered her rocephin. It appears that she has tolerated cephalosporins before in the past.   RUQ US shows 1.3 cm gallstone lodged in the gallbladder neck. Mild gallbladder wall thickening without sonographic Murphy sign.   Consulted general surgery and spoke with Dr. Dossie Der. He is ok with the rocephin and will admit her into his service for likely cholecystectomy. No other recommendations.   I have dicussed this with the patient and she is amenable to admission.    Portions of this report may have been transcribed using voice recognition software. Every effort was made to ensure accuracy; however, inadvertent computerized transcription  errors may be present.    Final Clinical Impression(s) / ED Diagnoses Final diagnoses:  Gallbladder stone with nonacute cholecystitis    Rx / DC Orders ED Discharge Orders     None         Achille Rich, PA-C 03/11/23 [id number]    Gilda Crease, MD 03/11/23 [phone]

## 2023-03-11 NOTE — H&P (Signed)
Admitting Physician: Hyman Hopes Delfino Friesen  Service: General Surgery  CC: Abdominal pain  Subjective   HPI: Rachel Stuart is an 27 y.o. female who is here for abdominal pain.  The pain started in her back yesterday.  She there was not anything in particular she ate beforehand that caused the pain.  She tried to take her medications and eat an egg sandwich keep her medication down yesterday afternoon and she threw that all up.  The pain started in her back and wrapped around her right side into her epigastric area and stayed in her epigastric area throughout the day until she presented to the emergency department.  She still has some pain though it has improved some.  Past Medical History:  Diagnosis Date   Ankle fracture    Anxiety    Depression    zoloft   Obesity    Ovarian cyst     Past Surgical History:  Procedure Laterality Date   NO PAST SURGERIES      Family History  Problem Relation Age of Onset   Depression Mother    Mental illness Mother    Hypertension Mother    Cancer Mother    Cancer Father    Hypertension Maternal Grandmother    Diabetes Maternal Grandmother    Stroke Maternal Grandmother    Cancer Maternal Grandmother        uterine   Depression Maternal Grandmother    Diabetes Paternal Grandfather     Social:  reports that she has never smoked. She has never used smokeless tobacco. She reports that she does not currently use alcohol. She reports that she does not use drugs.  Allergies:  Allergies  Allergen Reactions   Amoxicillin Swelling    Lips swell Has patient had a PCN reaction causing immediate rash, facial/tongue/throat swelling, SOB or lightheadedness with hypotension: yes Has patient had a PCN reaction causing severe rash involving mucus membranes or skin necrosis: no Has patient had a PCN reaction that required hospitalization: no Has patient had a PCN reaction occurring within the last 10 years: no If all of the above answers are "NO",  then may proceed with Cephalosporin use. CAN TAKE KEFLEX     Medications: Current Outpatient Medications  Medication Instructions   acetaminophen (TYLENOL) 500 mg, Oral, Every 6 hours PRN   HYDROcodone-acetaminophen (NORCO/VICODIN) 5-325 MG tablet 1 tablet, Oral, Every 4 hours PRN   ibuprofen (ADVIL) 600 mg, Oral, Every 6 hours PRN   ibuprofen (ADVIL) 600 mg, Oral, Every 6 hours PRN   lidocaine (LIDODERM) 5 % 1 patch, Transdermal, Every 24 hours, Remove & Discard patch within 12 hours or as directed by MD   methocarbamol (ROBAXIN) 500 mg, Oral, 2 times daily   ondansetron (ZOFRAN) 4 mg, Oral, Every 4 hours PRN   predniSONE (STERAPRED UNI-PAK 21 TAB) 10 MG (21) TBPK tablet As directed   Prenatal Vit-Fe Fumarate-FA (PRENATAL MULTIVITAMIN) TABS tablet 1 tablet, Oral, Daily    ROS - all of the below systems have been reviewed with the patient and positives are indicated with bold text General: chills, fever or night sweats Eyes: blurry vision or double vision ENT: epistaxis or sore throat Allergy/Immunology: itchy/watery eyes or nasal congestion Hematologic/Lymphatic: bleeding problems, blood clots or swollen lymph nodes Endocrine: temperature intolerance or unexpected weight changes Breast: new or changing breast lumps or nipple discharge Resp: cough, shortness of breath, or wheezing CV: chest pain or dyspnea on exertion GI: as per HPI GU: dysuria, trouble voiding, or  hematuria MSK: joint pain or joint stiffness Neuro: TIA or stroke symptoms Derm: pruritus and skin lesion changes Psych: anxiety and depression  Objective   PE Blood pressure 116/68, pulse (!) 59, temperature 99.3 F (37.4 C), temperature source Oral, resp. rate 18, SpO2 98%. Constitutional: NAD; conversant; no deformities Eyes: Moist conjunctiva; no lid lag; anicteric; PERRL Neck: Trachea midline; no thyromegaly Lungs: Normal respiratory effort; no tactile fremitus CV: RRR; no palpable thrills; no pitting  edema GI: Abd Soft, tender RUQ; no palpable hepatosplenomegaly MSK: Normal range of motion of extremities; no clubbing/cyanosis Psychiatric: Appropriate affect; alert and oriented x3 Lymphatic: No palpable cervical or axillary lymphadenopathy  Results for orders placed or performed during the hospital encounter of 03/10/23 (from the past 24 hour(s))  Urinalysis, Routine w reflex microscopic -Urine, Clean Catch     Status: Abnormal   Collection Time: 03/10/23  5:16 PM  Result Value Ref Range   Color, Urine YELLOW YELLOW   APPearance HAZY (A) CLEAR   Specific Gravity, Urine 1.019 1.005 - 1.030   pH 6.0 5.0 - 8.0   Glucose, UA NEGATIVE NEGATIVE mg/dL   Hgb urine dipstick NEGATIVE NEGATIVE   Bilirubin Urine NEGATIVE NEGATIVE   Ketones, ur NEGATIVE NEGATIVE mg/dL   Protein, ur NEGATIVE NEGATIVE mg/dL   Nitrite POSITIVE (A) NEGATIVE   Leukocytes,Ua SMALL (A) NEGATIVE   RBC / HPF 0-5 0 - 5 RBC/hpf   WBC, UA 11-20 0 - 5 WBC/hpf   Bacteria, UA MANY (A) NONE SEEN   Squamous Epithelial / HPF 0-5 0 - 5 /HPF   Mucus PRESENT   Lipase, blood     Status: None   Collection Time: 03/10/23  5:35 PM  Result Value Ref Range   Lipase 39 11 - 51 U/L  Comprehensive metabolic panel     Status: Abnormal   Collection Time: 03/10/23  5:35 PM  Result Value Ref Range   Sodium 138 135 - 145 mmol/L   Potassium 3.8 3.5 - 5.1 mmol/L   Chloride 106 98 - 111 mmol/L   CO2 24 22 - 32 mmol/L   Glucose, Bld 101 (H) 70 - 99 mg/dL   BUN 5 (L) 6 - 20 mg/dL   Creatinine, Ser 2.84 0.44 - 1.00 mg/dL   Calcium 9.4 8.9 - 13.2 mg/dL   Total Protein 7.3 6.5 - 8.1 g/dL   Albumin 4.1 3.5 - 5.0 g/dL   AST 15 15 - 41 U/L   ALT 13 0 - 44 U/L   Alkaline Phosphatase 71 38 - 126 U/L   Total Bilirubin 0.4 <1.2 mg/dL   GFR, Estimated >44 >01 mL/min   Anion gap 8 5 - 15  CBC     Status: Abnormal   Collection Time: 03/10/23  5:35 PM  Result Value Ref Range   WBC 12.1 (H) 4.0 - 10.5 K/uL   RBC 4.76 3.87 - 5.11 MIL/uL    Hemoglobin 14.1 12.0 - 15.0 g/dL   HCT 02.7 25.3 - 66.4 %   MCV 87.0 80.0 - 100.0 fL   MCH 29.6 26.0 - 34.0 pg   MCHC 34.1 30.0 - 36.0 g/dL   RDW 40.3 47.4 - 25.9 %   Platelets 379 150 - 400 K/uL   nRBC 0.0 0.0 - 0.2 %  hCG, serum, qualitative     Status: None   Collection Time: 03/10/23  5:35 PM  Result Value Ref Range   Preg, Serum NEGATIVE NEGATIVE     Imaging Orders  CT ABDOMEN PELVIS W CONTRAST         US Abdomen Limited RUQ (LIVER/GB)      Assessment and Plan   Gracieann Convey is an 27 y.o. female with abdominal pain found to have acute cholecystitis.  I recommended laparoscopic cholecystectomy.  We discussed the procedure, its risks, benefits and alternatives.  After a full discussion the patient granted consent to proceed.  I will discuss with Dr. Corliss Skains who takes over the general surgery service in the morning and work to find OR time for cholecystectomy.      ICD-10-CM   1. Gallbladder stone with nonacute cholecystitis  K80.10        Quentin Ore, MD  [facility] Use [email or web address] to contact on call provider  New Patient Billing: [id number] - High MDM

## 2023-03-11 NOTE — Anesthesia Procedure Notes (Signed)
Procedure Name: Intubation Date/Time: 03/11/2023 2:19 PM  Performed by: Darlina Guys, CRNAPre-anesthesia Checklist: Patient identified, Emergency Drugs available, Suction available and Patient being monitored Patient Re-evaluated:Patient Re-evaluated prior to induction Oxygen Delivery Method: Circle System Utilized Preoxygenation: Pre-oxygenation with 100% oxygen Induction Type: IV induction Ventilation: Mask ventilation without difficulty Laryngoscope Size: Mac and 3 Grade View: Grade I Tube type: Oral Tube size: 7.0 mm Number of attempts: 1 Airway Equipment and Method: Stylet and Oral airway Placement Confirmation: ETT inserted through vocal cords under direct vision, positive ETCO2 and breath sounds checked- equal and bilateral Secured at: 22 cm Tube secured with: Tape Dental Injury: Teeth and Oropharynx as per pre-operative assessment  Comments: Atraumatic induction and intubation. Dentition and oral mucosa as per preop

## 2023-03-11 NOTE — Op Note (Signed)
Laparoscopic Cholecystectomy Procedure Note  Indications: This patient presents with acute cholecystitis and will undergo laparoscopic cholecystectomy.  US revealed gallstones with wall thickening.  LFT's WNL.  Pre-operative Diagnosis: Calculus of gallbladder with acute cholecystitis, without mention of obstruction  Post-operative Diagnosis: Same  Surgeon: Wynona Luna   Assistants: Carlena Bjornstad, PA-C  Anesthesia: General endotracheal anesthesia  ASA Class: 2  Procedure Details  The patient was seen again in the Holding Room. The risks, benefits, complications, treatment options, and expected outcomes were discussed with the patient. The possibilities of reaction to medication, pulmonary aspiration, perforation of viscus, bleeding, recurrent infection, finding a normal gallbladder, the need for additional procedures, failure to diagnose a condition, the possible need to convert to an open procedure, and creating a complication requiring transfusion or operation were discussed with the patient. The likelihood of improving the patient's symptoms with return to their baseline status is good.  The patient and/or family concurred with the proposed plan, giving informed consent. The site of surgery properly noted. The patient was taken to Operating Room, identified as Rachel Stuart and the procedure verified as Laparoscopic Cholecystectomy with Intraoperative Cholangiogram. A Time Out was held and the above information confirmed.  Prior to the induction of general anesthesia, antibiotic prophylaxis was administered. General endotracheal anesthesia was then administered and tolerated well. After the induction, the abdomen was prepped with Chloraprep and draped in sterile fashion. The patient was positioned in the supine position.  Local anesthetic agent was injected into the skin above the umbilicus and an incision made. We dissected down to the abdominal fascia with blunt dissection.  The patient  has an umbilical hernia containing some fat.  We dissected the hernia sac away from the umbilicus and opened the sac.  This contained only fat, which was amputated.  The patient has a 7 mm hernia defect.  We grasped the edges of the fascial defect with Kocher clamps.  The fascia was incised vertically up to 12 mm and we entered the peritoneal cavity bluntly.  A pursestring suture of 0-Vicryl was placed around the fascial opening.  The Hasson cannula was inserted and secured with the stay suture.  Pneumoperitoneum was then created with CO2 and tolerated well without any adverse changes in the patient's vital signs. An 11-mm port was placed in the subxiphoid position.  Two 5-mm ports were placed in the right upper quadrant. All skin incisions were infiltrated with a local anesthetic agent before making the incision and placing the trocars.   We positioned the patient in reverse Trendelenburg, tilted slightly to the patient's left.  The gallbladder was identified, the fundus grasped and retracted cephalad. The gallbladder is quite thickened and inflamed.  There are chronic omental adhesions to the gallbladder.  Adhesions were lysed bluntly and with the electrocautery where indicated, taking care not to injure any adjacent organs or viscus. The infundibulum was grasped and retracted laterally, exposing the peritoneum overlying the triangle of Calot. This was then divided and exposed in a blunt fashion. The cystic duct was clearly identified and bluntly dissected circumferentially. A critical view of the cystic duct and cystic artery was obtained.  We considered an intraoperative cholangiogram, but the cystic duct is very short and I could visualize the common bile duct.  I made the decision not to pursue cholangiogram.  The cystic duct was then ligated with clips and divided. The cystic artery was, dissected free, ligated with clips and divided as well.   The gallbladder was dissected from the liver  bed in  retrograde fashion with the electrocautery. The gallbladder was removed and placed in an Eco sac. The liver bed was irrigated and inspected. Hemostasis was achieved with the electrocautery. Copious irrigation was utilized and was repeatedly aspirated until clear.  The gallbladder and Eco sac were then removed through the umbilical port site.  The pursestring suture was used to close the umbilical fascia.    We again inspected the right upper quadrant for hemostasis.  Pneumoperitoneum was released as we removed the trocars.  4-0 Monocryl was used to close the skin.   Benzoin, steri-strips, and clean dressings were applied. The patient was then extubated and brought to the recovery room in stable condition. Instrument, sponge, and needle counts were correct at closure and at the conclusion of the case.   Findings: Acute cholecystitis with Cholelithiasis  Estimated Blood Loss: less than 50 mL         Drains: none         Specimens: Gallbladder           Complications: None; patient tolerated the procedure well.         Disposition: PACU - hemodynamically stable.         Condition: stable  Rachel Stuart. Rachel Skains, MD, [facility] Surgery  General Surgery   03/11/2023 3:14 PM

## 2023-03-11 NOTE — Progress Notes (Signed)
Subjective/Chief Complaint: No further abdominal pain or nausea   Objective: Vital signs in last 24 hours: Temp:  [97.9 F (36.6 C)-99.5 F (37.5 C)] 97.9 F (36.6 C) (12/11 0623) Pulse Rate:  [56-83] 56 (12/11 0623) Resp:  [18] 18 (12/11 0623) BP: (80-155)/(40-108) 87/49 (12/11 0623) SpO2:  [97 %-98 %] 97 % (12/11 0623) Last BM Date : 03/10/23  Intake/Output from previous day: 12/10 0701 - 12/11 0700 In: 1001 [I.V.:206.3; IV Piggyback:794.7] Out: -  Intake/Output this shift: No intake/output data recorded.  Abd - soft, minimal RUQ tenderness Small umbilical hernia - partially reducible  Lab Results:  Recent Labs    03/10/23 1735 03/11/23 0403  WBC 12.1* 12.8*  HGB 14.1 12.3  HCT 41.4 36.7  PLT 379 319   BMET Recent Labs    03/10/23 1735 03/11/23 0403  NA 138 133*  K 3.8 3.5  CL 106 106  CO2 24 20*  GLUCOSE 101* 116*  BUN 5* 7  CREATININE 0.86 0.83  CALCIUM 9.4 8.4*   PT/INR No results for input(s): "LABPROT", "INR" in the last 72 hours. ABG No results for input(s): "PHART", "HCO3" in the last 72 hours.  Invalid input(s): "PCO2", "PO2"  Studies/Results: US Abdomen Limited RUQ (LIVER/GB)  Result Date: 03/11/2023 CLINICAL DATA:  Right upper quadrant pain EXAM: ULTRASOUND ABDOMEN LIMITED RIGHT UPPER QUADRANT COMPARISON:  CT 03/10/2023 FINDINGS: Gallbladder: 1.3 cm gallstone in the neck of the gallbladder, non mobile. Gallbladder wall thickening at 5 mm. Negative sonographic Murphy's sign. Common bile duct: Diameter: Normal caliber, 3 mm Liver: No focal lesion identified. Within normal limits in parenchymal echogenicity. Portal vein is patent on color Doppler imaging with normal direction of blood flow towards the liver. Other: None. IMPRESSION: 1.3 cm gallstone lodged in the gallbladder neck. Mild gallbladder wall thickening without sonographic Murphy sign. Electronically Signed   By: Charlett Nose M.D.   On: 03/11/2023 02:38   CT ABDOMEN PELVIS W  CONTRAST  Result Date: 03/10/2023 CLINICAL DATA:  Abdominal pain. EXAM: CT ABDOMEN AND PELVIS WITH CONTRAST TECHNIQUE: Multidetector CT imaging of the abdomen and pelvis was performed using the standard protocol following bolus administration of intravenous contrast. RADIATION DOSE REDUCTION: This exam was performed according to the departmental dose-optimization program which includes automated exposure control, adjustment of the mA and/or kV according to patient size and/or use of iterative reconstruction technique. CONTRAST:  75mL OMNIPAQUE IOHEXOL 350 MG/ML SOLN COMPARISON:  July 27, 2021 FINDINGS: Lower chest: No acute abnormality. Hepatobiliary: No focal liver abnormality is seen. The gallbladder is moderately distended, without evidence of gallstones, gallbladder wall thickening, or biliary dilatation. Pancreas: Unremarkable. No pancreatic ductal dilatation or surrounding inflammatory changes. Spleen: Normal in size without focal abnormality. Adrenals/Urinary Tract: Adrenal glands are unremarkable. Kidneys are normal, without renal calculi, focal lesion, or hydronephrosis. Bladder is unremarkable. Stomach/Bowel: Stomach is within normal limits. Appendix appears normal. No evidence of bowel wall thickening, distention, or inflammatory changes. Vascular/Lymphatic: No significant vascular findings are present. Numerous subcentimeter mesenteric lymph nodes are seen scattered throughout the abdomen. This is most notable within the mid and lower right abdomen. Reproductive: Uterus and bilateral adnexa are unremarkable. Other: A 2.8 cm x 2.2 cm x 2.2 cm fat containing umbilical hernia is seen. No abdominopelvic ascites. Musculoskeletal: A 2.9 cm x 3.9 cm x 3.1 cm thin walled fluid collection (approximately 19.88 Hounsfield units) is seen along the posterior aspect of the labia on the left. There is stable grade 1 anterolisthesis of the L5 vertebral body on  S1. IMPRESSION: 1. 2.9 cm x 3.9 cm x 3.1 cm fluid  collection along the posterior aspect of the labia on the left, which may represent a Bartholin's gland cyst. Correlation with physical examination is recommended. 2. Numerous subcentimeter mesenteric lymph nodes scattered throughout the abdomen, which may represent mesenteric adenitis. 3. Fat-containing umbilical hernia. 4. Stable grade 1 anterolisthesis of the L5 vertebral body on S1. Electronically Signed   By: Aram Candela M.D.   On: 03/10/2023 21:10    Anti-infectives: Anti-infectives (From admission, onward)    Start     Dose/Rate Route Frequency Ordered Stop   03/11/23 0315  cefTRIAXone (ROCEPHIN) 2 g in sodium chloride 0.9 % 100 mL IVPB        2 g 200 mL/hr over 30 Minutes Intravenous Every 24 hours 03/11/23 0310 03/18/23 0314   03/11/23 0145  cefTRIAXone (ROCEPHIN) 1 g in sodium chloride 0.9 % 100 mL IVPB  Status:  Discontinued        1 g 200 mL/hr over 30 Minutes Intravenous  Once 03/11/23 0141 03/11/23 0318       Assessment/Plan: Acute calculus cholecystitis Umbilical hernia  Recommend proceeding with laparoscopic cholecystectomy with intraoperative cholangiogram today.  The surgical procedure has been discussed with the patient.  Potential risks, benefits, alternative treatments, and expected outcomes have been explained.  All of the patient's questions at this time have been answered.  The likelihood of reaching the patient's treatment goal is good.  The patient understand the proposed surgical procedure and wishes to proceed. We will repair the umbilical hernia as part of the procedure.  LOS: 0 days    Wynona Luna 03/11/2023

## 2023-03-12 ENCOUNTER — Encounter (HOSPITAL_COMMUNITY): Payer: Self-pay | Admitting: Surgery

## 2023-03-12 ENCOUNTER — Other Ambulatory Visit (HOSPITAL_COMMUNITY): Payer: Self-pay

## 2023-03-12 MED ORDER — DOCUSATE SODIUM 100 MG PO CAPS
100.0000 mg | ORAL_CAPSULE | Freq: Two times a day (BID) | ORAL | 0 refills | Status: DC
  Filled 2023-03-12: qty 10, 5d supply, fill #0

## 2023-03-12 MED ORDER — ACETAMINOPHEN 500 MG PO TABS
1000.0000 mg | ORAL_TABLET | Freq: Four times a day (QID) | ORAL | 0 refills | Status: AC | PRN
  Filled 2023-03-12: qty 80, 10d supply, fill #0

## 2023-03-12 MED ORDER — OXYCODONE HCL 5 MG PO TABS
5.0000 mg | ORAL_TABLET | Freq: Four times a day (QID) | ORAL | 0 refills | Status: DC | PRN
  Filled 2023-03-12: qty 15, 4d supply, fill #0

## 2023-03-12 NOTE — Anesthesia Postprocedure Evaluation (Signed)
Anesthesia Post Note  Patient: [name]  Procedure(s) Performed: LAPAROSCOPIC CHOLECYSTECTOMY (Abdomen)     Patient location during evaluation: PACU Anesthesia Type: General Level of consciousness: awake and alert Pain management: pain level controlled Vital Signs Assessment: post-procedure vital signs reviewed and stable Respiratory status: spontaneous breathing, nonlabored ventilation, respiratory function stable and patient connected to nasal cannula oxygen Cardiovascular status: blood pressure returned to baseline and stable Postop Assessment: no apparent nausea or vomiting Anesthetic complications: no   No notable events documented.  Last Vitals:  Vitals:   03/12/23 0521 03/12/23 0813  BP: (!) 111/58 (!) 94/47  Pulse: (!) 42 (!) 52  Resp: 20 18  Temp: 36.8 C 36.7 C  SpO2: 97% 95%    Last Pain:  Vitals:   03/12/23 0845  TempSrc:   PainSc: 3                  [place]

## 2023-03-12 NOTE — Progress Notes (Signed)
Discharge instructions given to pt. Pt verbalized understanding of all teaching. Pt made aware to pick up medications from pharmacy on 2nd floor when leaving.

## 2023-03-12 NOTE — Plan of Care (Signed)
  Problem: Education: Goal: Knowledge of General Education information will improve Description: Including pain rating scale, medication(s)/side effects and non-pharmacologic comfort measures Outcome: Progressing   Problem: Activity: Goal: Risk for activity intolerance will decrease Outcome: Progressing   Problem: Pain Management: Goal: General experience of comfort will improve Outcome: Progressing   Problem: Safety: Goal: Ability to remain free from injury will improve Outcome: Progressing

## 2023-03-12 NOTE — Discharge Summary (Signed)
[facility] Discharge Summary   Patient ID: Rachel Stuart MRN: [id number] DOB/AGE: 1995-11-08 27 y.o.  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 Gallbladder wall thickening at 5 mm. Negative sonographic Murphy's sign. Common bile duct: Diameter: Normal caliber, 3 mm Liver: No focal lesion identified. Within normal limits in parenchymal echogenicity. Portal vein is patent on color Doppler imaging with normal direction of blood flow towards the liver. Other: None. IMPRESSION: 1.3 cm gallstone lodged in the gallbladder neck. Mild gallbladder wall thickening without sonographic Murphy sign. Electronically Signed   By: Charlett Nose M.D.   On: 03/11/2023 02:38   CT ABDOMEN PELVIS W CONTRAST Result Date: 03/10/2023 CLINICAL DATA:  Abdominal pain. EXAM: CT ABDOMEN AND PELVIS WITH CONTRAST TECHNIQUE: Multidetector CT imaging of the abdomen and pelvis was performed using the standard protocol following bolus administration of intravenous contrast. RADIATION DOSE REDUCTION: This exam was performed  according to the departmental dose-optimization program which includes automated exposure control, adjustment of the mA and/or kV according to patient size and/or use of iterative reconstruction technique. CONTRAST:  75mL OMNIPAQUE IOHEXOL 350 MG/ML SOLN COMPARISON:  July 27, 2021 FINDINGS: Lower chest: No acute abnormality. Hepatobiliary: No focal liver abnormality is seen. The gallbladder is moderately distended, without evidence of gallstones, gallbladder wall thickening, or biliary dilatation. Pancreas: Unremarkable. No pancreatic ductal dilatation or surrounding inflammatory changes. Spleen: Normal in size without focal abnormality. Adrenals/Urinary Tract: Adrenal glands are unremarkable. Kidneys are normal, without renal calculi, focal lesion, or hydronephrosis. Bladder is unremarkable. Stomach/Bowel: Stomach is within normal limits. Appendix appears normal. No evidence of bowel wall thickening, distention, or inflammatory changes. Vascular/Lymphatic: No significant vascular findings are present. Numerous subcentimeter mesenteric lymph nodes are seen scattered throughout the abdomen. This is most notable within the mid and lower right abdomen. Reproductive: Uterus and bilateral adnexa are unremarkable. Other: A 2.8 cm x 2.2 cm x 2.2 cm fat containing umbilical hernia is seen. No abdominopelvic ascites. Musculoskeletal: A 2.9 cm x 3.9 cm x 3.1 cm thin walled fluid collection (approximately 19.88 Hounsfield units) is seen along the posterior aspect of the labia on the left. There is stable grade 1 anterolisthesis of the L5 vertebral body on S1. IMPRESSION: 1. 2.9 cm x 3.9 cm x 3.1 cm fluid collection along the posterior aspect of the labia on the left, which may represent a Bartholin's gland cyst. Correlation with physical examination is recommended. 2. Numerous subcentimeter mesenteric lymph nodes scattered throughout the abdomen, which may represent mesenteric adenitis. 3. Fat-containing umbilical hernia. 4.  Stable grade 1 anterolisthesis of the L5 vertebral body on S1. Electronically Signed   By: Aram Candela M.D.   On: 03/10/2023 21:10  Procedures Dr. Manus Rudd 03/11/2023 - Laparoscopic Cholecystectomy   Hospital Course:  27 y/o F who presented to [facility] with abdominal pain.  Workup showed cholecystitis.  Patient was admitted and underwent procedure listed above.  Tolerated procedure well and was transferred to the floor.  Diet was advanced as tolerated.  On POD#1, the patient was voiding well, tolerating diet, ambulating well, pain well controlled, vital signs stable, incisions c/d/i and felt stable for discharge home.  Patient will follow up in our office as below and knows to call with questions or concerns.    I have personally reviewed the patients medication history on the [place] controlled substance database.   Physical Exam: General:  Alert, NAD, pleasant, comfortable Abd:  Soft, ND, mild tenderness, incisions C/D/I  Allergies as of 03/12/2023       Reactions   Amoxicillin Swelling   Lips swell Has patient had a PCN reaction causing immediate rash, facial/tongue/throat swelling, SOB or lightheadedness with hypotension: yes Has patient had a PCN reaction causing severe rash involving mucus membranes or skin necrosis: no Has patient had a PCN reaction that required hospitalization: no Has patient had a PCN reaction occurring within the last 10 years: no If all of the above answers are "NO", then may proceed with Cephalosporin use. CAN TAKE KEFLEX        Medication List     STOP taking these medications    aspirin-acetaminophen-caffeine 250-250-65 MG tablet Commonly known as: EXCEDRIN MIGRAINE   HYDROcodone-acetaminophen 10-325 MG tablet Commonly known as: NORCO   ibuprofen 600 MG tablet Commonly known as: ADVIL   predniSONE 10 MG (21) Tbpk tablet Commonly known as: STERAPRED UNI-PAK 21 TAB       TAKE these medications    acetaminophen 500 MG  tablet Commonly known as: TYLENOL Take 2 tablets (1,000 mg total) by mouth every 6 (six) hours as needed for up to 10 days. What changed:  how much to take reasons to take this   cetirizine 10 MG tablet Commonly known as: ZYRTEC Take 10 mg by mouth daily.   docusate sodium 100 MG capsule Commonly known as: COLACE Take 1 capsule (100 mg total) by mouth 2 (two) times daily.   escitalopram 10 MG tablet Commonly known as: LEXAPRO Take 10 mg by mouth daily.   gabapentin 300 MG capsule Commonly known as: NEURONTIN Take 300 mg by mouth 3 (three) times daily.   naloxone 4 MG/0.1ML Liqd nasal spray kit Commonly known as: NARCAN Place 0.4 mg into the nose once.   ondansetron 4 MG tablet Commonly known as: ZOFRAN Take 1 tablet (4 mg total) by mouth every 4 (four) hours as needed for nausea or vomiting.   oxyCODONE 5 MG immediate release tablet Commonly known as: Oxy IR/ROXICODONE Take 1 tablet (5 mg total) by mouth every 6 (six) hours as needed for breakthrough pain (pain not releived by tylenol or advil).   SUMAtriptan 50 MG tablet Commonly known as: IMITREX Take 50 mg by mouth every 2 (two) hours as needed for migraine.   tiZANidine 4 MG tablet Commonly known as: ZANAFLEX Take 4 mg by mouth 3 (three) times daily as needed.          Follow-up Information     Manus Rudd, MD. Go on 04/10/2023.   Specialty: General Surgery Why: Your appointment is 1/10 at 10:50am Arrive early to check in, fill out paperwork, Bring photo ID and insurance information Contact information: [address] [place]  [address] [phone]                 Signed: Hosie Spangle, [facility] 03/12/2023, 8:14 AM

## 2023-03-13 LAB — SURGICAL PATHOLOGY

## 2023-04-10 NOTE — Progress Notes (Signed)
   PROVIDER:  DONNICE DEWAYNE LIMA, MD  MRN: [id number] DOB: 04-23-1995 DATE OF ENCOUNTER: 04/10/2023 Interval History:   Hospital follow up  This is a 28 year old female who presented to the emergency department on 03/11/2023 with abdominal pain.  She was diagnosed with acute cholecystitis.  Later that day, we performed a laparoscopic cholecystectomy.  She also had a small umbilical hernia.  The gallbladder showed some thickening and inflammation with some chronic adhesions.  Appetite is improving.  Initially she had some diarrhea but this has resolved.  Physical Examination:   Physical Exam   Well-developed well-nourished no apparent distress Skin shows no jaundice Abdomen soft and nontender Her incisions are well-healed with no sign of infection   Assessment and Plan:   Rachel Stuart is a 28 y.o. female who underwent laparoscopic cholecystectomy on 03/11/2023.  Diagnoses and all orders for this visit:  Acute cholecystitis    Resume regular diet full activity  Return if symptoms worsen or fail to improve.   The plan was discussed in detail with the patient today, who expressed understanding.  The patient has my contact information, and understands to call me with any additional questions or concerns in the interval.  I would be happy to see the patient back sooner if the need arises.   MATTHEW KAI TSUEI, MD

## 2023-10-22 ENCOUNTER — Other Ambulatory Visit: Payer: Self-pay

## 2023-10-22 ENCOUNTER — Emergency Department (HOSPITAL_COMMUNITY)
Admission: EM | Admit: 2023-10-22 | Discharge: 2023-10-22 | Disposition: A | Discharge status: Home / Self Care | Attending: Emergency Medicine | Admitting: Emergency Medicine

## 2023-10-22 ENCOUNTER — Emergency Department (HOSPITAL_COMMUNITY)

## 2023-10-22 ENCOUNTER — Encounter (HOSPITAL_COMMUNITY): Payer: Self-pay

## 2023-10-22 DIAGNOSIS — R079 Chest pain, unspecified: Secondary | ICD-10-CM | POA: Insufficient documentation

## 2023-10-22 DIAGNOSIS — R8281 Pyuria: Secondary | ICD-10-CM | POA: Diagnosis not present

## 2023-10-22 DIAGNOSIS — R8271 Bacteriuria: Secondary | ICD-10-CM | POA: Insufficient documentation

## 2023-10-22 DIAGNOSIS — R1033 Periumbilical pain: Secondary | ICD-10-CM | POA: Insufficient documentation

## 2023-10-22 DIAGNOSIS — R109 Unspecified abdominal pain: Secondary | ICD-10-CM

## 2023-10-22 LAB — URINALYSIS, ROUTINE W REFLEX MICROSCOPIC
Bilirubin Urine: NEGATIVE
Glucose, UA: NEGATIVE mg/dL
Hgb urine dipstick: NEGATIVE
Ketones, ur: NEGATIVE mg/dL
Nitrite: NEGATIVE
Protein, ur: 30 mg/dL — AB
Specific Gravity, Urine: 1.013 (ref 1.005–1.030)
WBC, UA: >50 WBC/hpf (ref 0–5)
pH: 8 (ref 5.0–8.0)

## 2023-10-22 LAB — COMPREHENSIVE METABOLIC PANEL WITH GFR
ALT: 19 U/L (ref 0–44)
AST: 17 U/L (ref 15–41)
Albumin: 3.6 g/dL (ref 3.5–5.0)
Alkaline Phosphatase: 72 U/L (ref 38–126)
Anion gap: 11 (ref 5–15)
BUN: 8 mg/dL (ref 6–20)
CO2: 16 mmol/L — ABNORMAL LOW (ref 22–32)
Calcium: 9.2 mg/dL (ref 8.9–10.3)
Chloride: 109 mmol/L (ref 98–111)
Creatinine, Ser: 0.87 mg/dL (ref 0.44–1.00)
GFR, Estimated: >60 mL/min (ref >60)
Glucose, Bld: 99 mg/dL (ref 70–99)
Potassium: 4 mmol/L (ref 3.5–5.1)
Sodium: 136 mmol/L (ref 135–145)
Total Bilirubin: 1.1 mg/dL (ref 0.0–1.2)
Total Protein: 6.9 g/dL (ref 6.5–8.1)

## 2023-10-22 LAB — CBC
HCT: 42.5 % (ref 36.0–46.0)
Hemoglobin: 14.3 g/dL (ref 12.0–15.0)
MCH: 29.9 pg (ref 26.0–34.0)
MCHC: 33.6 g/dL (ref 30.0–36.0)
MCV: 88.7 fL (ref 80.0–100.0)
Platelets: 394 K/uL (ref 150–400)
RBC: 4.79 MIL/uL (ref 3.87–5.11)
RDW: 12.3 % (ref 11.5–15.5)
WBC: 7.1 K/uL (ref 4.0–10.5)
nRBC: 0 % (ref 0.0–0.2)

## 2023-10-22 LAB — TROPONIN I (HIGH SENSITIVITY)
Troponin I (High Sensitivity): 39 ng/L — ABNORMAL HIGH (ref <18)
Troponin I (High Sensitivity): 23 ng/L — ABNORMAL HIGH (ref <18)

## 2023-10-22 LAB — LIPASE, BLOOD: Lipase: 28 U/L (ref 11–51)

## 2023-10-22 LAB — D-DIMER, QUANTITATIVE: D-Dimer, Quant: <0.27 ug{FEU}/mL (ref 0.00–0.50)

## 2023-10-22 LAB — HCG, SERUM, QUALITATIVE: Preg, Serum: NEGATIVE

## 2023-10-22 MED ORDER — IOHEXOL 350 MG/ML SOLN
75.0000 mL | Freq: Once | INTRAVENOUS | Status: AC | PRN
  Administered 2023-10-22: 75 mL via INTRAVENOUS

## 2023-10-22 MED ORDER — DIPHENHYDRAMINE HCL 50 MG/ML IJ SOLN: 50.0000 mg | Freq: Once | INTRAMUSCULAR | Status: DC

## 2023-10-22 MED ORDER — ONDANSETRON HCL 4 MG/2ML IJ SOLN
4.0000 mg | Freq: Once | INTRAMUSCULAR | Status: AC
  Administered 2023-10-22: 4 mg via INTRAVENOUS
  Filled 2023-10-22: qty 2

## 2023-10-22 MED ORDER — MORPHINE SULFATE (PF) 4 MG/ML IV SOLN
4.0000 mg | Freq: Once | INTRAVENOUS | Status: AC
Start: 2023-10-22 — End: 2023-10-22
  Administered 2023-10-22: 4 mg via INTRAVENOUS
  Filled 2023-10-22: qty 1

## 2023-10-22 MED ORDER — MORPHINE SULFATE (PF) 2 MG/ML IV SOLN
2.0000 mg | Freq: Once | INTRAVENOUS | Status: AC
  Administered 2023-10-22: 2 mg via INTRAVENOUS
  Filled 2023-10-22: qty 1

## 2023-10-22 MED ORDER — CEPHALEXIN 500 MG PO CAPS: 500.0000 mg | ORAL_CAPSULE | Freq: Two times a day (BID) | ORAL | 0 refills | Status: AC

## 2023-10-22 MED ORDER — SODIUM CHLORIDE 0.9 % IV BOLUS
1000.0000 mL | Freq: Once | INTRAVENOUS | Status: AC
  Administered 2023-10-22: 1000 mL via INTRAVENOUS

## 2023-10-22 NOTE — ED Provider Notes (Addendum)
 [place] EMERGENCY DEPARTMENT AT [facility] Provider Note   CSN: [id number] Arrival date & time: 10/22/23  1049     Patient presents with: Abdominal Pain and Diarrhea  HPI Rachel Stuart is a 28 y.o. female presenting for abdominal pain. S/p cholecystectomy. She states been going on for 2 weeks.  Reports that recent history of umbilical hernia.  She locates the pain in the umbilical region and states it is nonradiating otherwise.  Endorses some nausea vomiting.  Had a normal bowel movement yesterday.  Also reports that she may have felt some chest pain earlier this morning that radiated to her left arm with intermittent shortness of breath.  The symptoms have since resolved.  Does endorse OCP use but denies recent long trips, h/o blood clots, or immobilization.   Past Medical History:  Diagnosis Date   Ankle fracture    Anxiety    Depression    zoloft    Obesity    Ovarian cyst        Abdominal Pain Associated symptoms: diarrhea   Diarrhea Associated symptoms: abdominal pain        Prior to Admission medications   Medication Sig Start Date End Date Taking? Authorizing Provider  cetirizine (ZYRTEC) 10 MG tablet Take 10 mg by mouth daily. 10/29/22   [provider]  docusate sodium  (COLACE) 100 MG capsule Take 1 capsule (100 mg total) by mouth 2 (two) times daily. 03/12/23   Augustus Almarie RAMAN, PA-C  escitalopram (LEXAPRO) 10 MG tablet Take 10 mg by mouth daily. 02/21/23   [provider]  gabapentin  (NEURONTIN ) 300 MG capsule Take 300 mg by mouth 3 (three) times daily.    [provider]  naloxone [facility]) nasal spray 4 mg/0.1 mL Place 0.4 mg into the nose once.    [provider]  ondansetron  (ZOFRAN ) 4 MG tablet Take 1 tablet (4 mg total) by mouth every 4 (four) hours as needed for nausea or vomiting. 07/27/21   Elnor Jayson LABOR, DO  oxyCODONE  (OXY IR/ROXICODONE ) 5 MG immediate release tablet Take 1 tablet (5 mg total) by mouth  every 6 (six) hours as needed for breakthrough pain (pain not releived by tylenol  or advil ). 03/12/23   Augustus Almarie RAMAN, PA-C  SUMAtriptan (IMITREX) 50 MG tablet Take 50 mg by mouth every 2 (two) hours as needed for migraine. 02/28/23   [provider]  tiZANidine (ZANAFLEX) 4 MG tablet Take 4 mg by mouth 3 (three) times daily as needed.    [provider]    Allergies: Amoxicillin    Review of Systems  Gastrointestinal:  Positive for abdominal pain and diarrhea.    Updated Vital Signs BP 112/63   Pulse (!) 47   Temp 98.9 F (37.2 C) (Oral)   Resp (!) 21   LMP 08/19/2023 (Approximate)   SpO2 100%   Physical Exam Vitals and nursing note reviewed.  HENT:     Head: Normocephalic and atraumatic.     Mouth/Throat:     Mouth: Mucous membranes are moist.  Eyes:     General:        Right eye: No discharge.        Left eye: No discharge.     Conjunctiva/sclera: Conjunctivae normal.  Cardiovascular:     Rate and Rhythm: Normal rate and regular rhythm.     Pulses: Normal pulses.     Heart sounds: Normal heart sounds.  Pulmonary:     Effort: Pulmonary effort is normal.  Breath sounds: Normal breath sounds.  Abdominal:     General: Abdomen is flat.     Palpations: Abdomen is soft.     Tenderness: There is abdominal tenderness in the periumbilical area.  Skin:    General: Skin is warm and dry.  Neurological:     General: No focal deficit present.  Psychiatric:        Mood and Affect: Mood normal.     (all labs ordered are listed, but only abnormal results are displayed) Labs Reviewed  COMPREHENSIVE METABOLIC PANEL WITH GFR - Abnormal; Notable for the following components:      Result Value   CO2 16 (*)    All other components within normal limits  URINALYSIS, ROUTINE W REFLEX MICROSCOPIC - Abnormal; Notable for the following components:   Color, Urine AMBER (*)    APPearance CLOUDY (*)    Protein, ur 30 (*)    Leukocytes,Ua LARGE (*)     Bacteria, UA MANY (*)    All other components within normal limits  TROPONIN I (HIGH SENSITIVITY) - Abnormal; Notable for the following components:   Troponin I (High Sensitivity) 39 (*)    All other components within normal limits  LIPASE, BLOOD  CBC  HCG, SERUM, QUALITATIVE  D-DIMER, QUANTITATIVE (NOT AT [facility])  TROPONIN I (HIGH SENSITIVITY)    EKG: EKG Interpretation Date/Time:  Thursday October 22 2023 13:59:25 EDT Ventricular Rate:  47 PR Interval:  161 QRS Duration:  93 QT Interval:  467 QTC Calculation: 413 R Axis:   64  Text Interpretation: Sinus bradycardia when compared top rior, slower rate No STEMI Confirmed by Ginger Barefoot ([id number]) on 10/22/2023 2:01:29 PM  Radiology: DG Chest 1 View Result Date: 10/22/2023 CLINICAL DATA:  Pain. EXAM: CHEST  1 VIEW COMPARISON:  Radiograph 04/10/2019 FINDINGS: The cardiomediastinal contours are normal. The lungs are clear. Pulmonary vasculature is normal. No consolidation, pleural effusion, or pneumothorax. No acute osseous abnormalities are seen. IMPRESSION: No active disease. Electronically Signed   By: Andrea Gasman M.D.   On: 10/22/2023 14:39     Procedures   Medications Ordered in the ED  sodium chloride  0.9 % bolus 1,000 mL (0 mLs Intravenous Stopped 10/22/23 1323)  ondansetron  (ZOFRAN ) injection 4 mg (4 mg Intravenous Given 10/22/23 1210)  morphine  (PF) 4 MG/ML injection 4 mg (4 mg Intravenous Given 10/22/23 1210)    Clinical Course as of 10/22/23 1547  Thu Oct 22, 2023  1433 On reassessment, patient stated that the periumbilical pain was much worse.  Ordered CT at this time. [JR]  1526 Abdominal pain + CP. Currently no CP. Talked to Cardiology. If Trop same or downtrending can follow up outpatient with cardiology. Increasing periumbilical pain, pending CT. Anticipate discharge.  [CB]    Clinical Course User Index [CB] Bauer, Collin S, PA-C [JR] Lang Norleen POUR, PA-C                                 Medical Decision  Making Amount and/or Complexity of Data Reviewed Labs: ordered. Radiology: ordered.  Risk Prescription drug management.   Initial Impression and Ddx 28 year old well-appearing female presenting for abdominal pain and chest pain.  Exam notable for periumbilical tenderness.  DDx includes appendicitis, kidney stone, diverticulitis, ectopic pregnancy, ovarian torsion, UTI,ACS, PE, aortic dissection, pneumothorax, other. Patient PMH that increases complexity of ED encounter: none  Interpretation of Diagnostics - I independent reviewed and interpreted the labs as  followed: elevated trop, bacteruria, pyuria  - CT ab/pelvis pending  - I reviewed interpreted EKG which revealed sinus bradycardia but otherwise nonischemic  Patient Reassessment and Ultimate Disposition/Management On reassessment she stated that her chest pain had resolved but her abdominal pain was worse still localizing it in the periumbilical region.  Ordered CT abdomen pelvis given the progression of her symptoms.  Spoke to Dr. Loni of cardiology due to chest pain and elevated initial troponin.  She stated that if troponin remains flat or downtrending as she is a good candidate for ambulatory referral to cardiology and to reach back out to them if her troponin continues to rise.  Signed out patient to Terrall First, PA.  If second troponin is reassuring and CT unremarkable anticipate discharge.  Patient management required discussion with the following services or consulting groups:  Cardiology  Complexity of Problems Addressed Acute complicated illness or Injury  Additional Data Reviewed and Analyzed Further history obtained from: Past medical history and medications listed in the EMR and Prior ED visit notes  Patient Encounter Risk Assessment Consideration of hospitalization      Final diagnoses:  Abdominal pain, unspecified abdominal location  Chest pain, unspecified type    ED Discharge Orders     None           Lang Norleen POUR, PA-C 10/22/23 1450    Lang Norleen POUR, PA-C 10/22/23 1548    Tegeler, Lonni PARAS, MD 10/22/23 1558

## 2023-10-22 NOTE — ED Notes (Signed)
 Patient transported to X-ray

## 2023-10-22 NOTE — ED Notes (Signed)
 Patient transported to CT

## 2023-10-22 NOTE — ED Notes (Signed)
 ED PA at bedside

## 2023-10-22 NOTE — ED Notes (Signed)
 [facility] called

## 2023-10-22 NOTE — ED Triage Notes (Signed)
 Pt bib ems from home c.o abd pain from a known umbilical hernia and diarrhea x 2 weeks.  Bp 94 palpated Given LR 4mg  zofran  IV

## 2023-10-22 NOTE — ED Provider Notes (Signed)
  Physical Exam  BP 113/68   Pulse (!) 50   Temp 98 F (36.7 C) (Oral)   Resp 16   LMP 08/19/2023 (Approximate)   SpO2 100%   Physical Exam Vitals and nursing note reviewed.  Constitutional:      General: She is not in acute distress.    Appearance: She is well-developed. She is not ill-appearing.  Eyes:     General: No scleral icterus.    Extraocular Movements: Extraocular movements intact.  Cardiovascular:     Rate and Rhythm: Normal rate and regular rhythm.  Pulmonary:     Effort: Pulmonary effort is normal. No respiratory distress.  Neurological:     General: No focal deficit present.     Mental Status: She is alert and oriented to person, place, and time. Mental status is at baseline.     Sensory: No sensory deficit.     Motor: No weakness.     Procedures  Procedures  ED Course / MDM   Clinical Course as of 10/22/23 1622  Thu Oct 22, 2023  1433 On reassessment, patient stated that the periumbilical pain was much worse.  Ordered CT at this time. [JR]  1526 Abdominal pain + CP. Currently no CP. Talked to Cardiology. If Trop same or downtrending can follow up outpatient with cardiology. Increasing periumbilical pain, pending CT. Anticipate discharge.  [CB]    Clinical Course User Index [CB] Satina Jerrell S, PA-C [JR] Lang Norleen POUR, PA-C   Medical Decision Making Amount and/or Complexity of Data Reviewed Labs: ordered. Radiology: ordered.  Risk Prescription drug management.   Patient care transferred over from Norleen Lang PA-C.  At time of handoff, awaiting CT scan.  Patient already been prescribed an ambulatory referral to cardiology and previous provider spoke with cardiology who believe that if second troponin was flat or decreased that she can follow-up in the outpatient setting.    Second troponin did decrease from previous.  UA did note bacteriuria with pyuria. Will send for culture.  Will prescribe Keflex  in the interim.  Cardiology follow-up  already prescribed, will have her follow-up with cardiology outpatient setting.  Will have her continue to monitor symptoms return for any new or worsening symptoms.  Patient vital signs have remained stable throughout the course of patient's time in the ED. Low suspicion for any other emergent pathology at this time. I believe this patient is safe to be discharged. Provided strict return to ER precautions. Patient expressed agreement and understanding of plan. All questions were answered.    Beola Terrall RAMAN, [place] 10/22/23 1727    Francesca Elsie CROME, MD 10/22/23 2014

## 2023-10-22 NOTE — Discharge Instructions (Addendum)
 You were seen today for chest pain and abdominal pain.  Your CT scan today was reassuring that we have low suspicion for any emergent causes of your abdominal pain.  However your urine did note some bacteria as well as some  immune response.  This is likely secondary to UTI, I am sending in some antibiotics which you are to begin taking twice a day for the next 5 days, take with food to help avoid stomach upset.  I am sending in the culture and they should reach out to you if negative.  You were also prescribed outpatient cardiology follow-up, issue reaching out to you in the next couple days however you can call their office if they have not reached out to you by them.  Please return to the ED if you have any new or worsening symptoms

## 2023-10-22 NOTE — ED Notes (Signed)
 Patient verbalizes understanding of discharge instructions. Opportunity for questioning and answers were provided. Pt discharged from ED.

## 2023-10-25 LAB — URINE CULTURE: Culture: >=100000 — AB

## 2023-10-26 ENCOUNTER — Telehealth (HOSPITAL_BASED_OUTPATIENT_CLINIC_OR_DEPARTMENT_OTHER): Payer: Self-pay | Admitting: *Deleted

## 2023-10-26 NOTE — Telephone Encounter (Signed)
 Post ED Visit - Positive Culture Follow-up  Culture report reviewed by antimicrobial stewardship pharmacist: [facility] []  Rankin Dee, Pharm.D. []  Venetia Gully, Pharm.D., BCPS AQ-ID []  Garrel Crews, Pharm.D., BCPS []  Almarie Lunger, Pharm.D., BCPS []  [place], [address].D., BCPS, AAHIVP []  Rosaline Bihari, Pharm.D., BCPS, AAHIVP []  Vernell Meier, PharmD, BCPS []  Latanya Hint, PharmD, BCPS []  Donald Medley, PharmD, BCPS []  Rocky Bold, PharmD []  Dorothyann Alert, PharmD, BCPS [x]  Koren Or, PharmD  [facility] []  Rosaline Edison, PharmD []  Romona Bliss, PharmD []  Dolphus Roller, PharmD []  Veva Seip, Rph []  Vernell Daunt) Leonce, PharmD []  Eva Allis, PharmD []  Rosaline Millet, PharmD []  Iantha Batch, PharmD []  Arvin Gauss, PharmD []  Wanda Hasting, PharmD []  Ronal Rav, PharmD []  Rocky Slade, PharmD []  Bard Jeans, PharmD   Positive urine culture Treated with Cephalexin , organism sensitive to the same and no further patient follow-up is required at this time.  Rachel Stuart 10/26/2023, 8:32 AM

## 2023-11-12 ENCOUNTER — Other Ambulatory Visit: Payer: Self-pay

## 2023-11-12 ENCOUNTER — Emergency Department (HOSPITAL_COMMUNITY)

## 2023-11-12 ENCOUNTER — Emergency Department (HOSPITAL_COMMUNITY)
Admission: EM | Admit: 2023-11-12 | Discharge: 2023-11-13 | Discharge status: Against Medical Advice | Attending: Emergency Medicine | Admitting: Emergency Medicine

## 2023-11-12 ENCOUNTER — Encounter (HOSPITAL_COMMUNITY): Payer: Self-pay

## 2023-11-12 DIAGNOSIS — F430 Acute stress reaction: Secondary | ICD-10-CM | POA: Insufficient documentation

## 2023-11-12 DIAGNOSIS — Z5321 Procedure and treatment not carried out due to patient leaving prior to being seen by health care provider: Secondary | ICD-10-CM | POA: Diagnosis not present

## 2023-11-12 DIAGNOSIS — M549 Dorsalgia, unspecified: Secondary | ICD-10-CM | POA: Insufficient documentation

## 2023-11-12 DIAGNOSIS — R0789 Other chest pain: Secondary | ICD-10-CM | POA: Insufficient documentation

## 2023-11-12 DIAGNOSIS — R0602 Shortness of breath: Secondary | ICD-10-CM | POA: Diagnosis not present

## 2023-11-12 LAB — CBC
HCT: 39.9 % (ref 36.0–46.0)
Hemoglobin: 13.4 g/dL (ref 12.0–15.0)
MCH: 29.4 pg (ref 26.0–34.0)
MCHC: 33.6 g/dL (ref 30.0–36.0)
MCV: 87.5 fL (ref 80.0–100.0)
Platelets: 397 K/uL (ref 150–400)
RBC: 4.56 MIL/uL (ref 3.87–5.11)
RDW: 12.6 % (ref 11.5–15.5)
WBC: 8.2 K/uL (ref 4.0–10.5)
nRBC: 0 % (ref 0.0–0.2)

## 2023-11-12 LAB — BASIC METABOLIC PANEL WITH GFR
Anion gap: 11 (ref 5–15)
BUN: 11 mg/dL (ref 6–20)
CO2: 17 mmol/L — ABNORMAL LOW (ref 22–32)
Calcium: 9 mg/dL (ref 8.9–10.3)
Chloride: 109 mmol/L (ref 98–111)
Creatinine, Ser: 0.76 mg/dL (ref 0.44–1.00)
GFR, Estimated: >60 mL/min (ref >60)
Glucose, Bld: 104 mg/dL — ABNORMAL HIGH (ref 70–99)
Potassium: 3 mmol/L — ABNORMAL LOW (ref 3.5–5.1)
Sodium: 137 mmol/L (ref 135–145)

## 2023-11-12 LAB — TROPONIN I (HIGH SENSITIVITY): Troponin I (High Sensitivity): 3 ng/L (ref <18)

## 2023-11-12 LAB — HCG, SERUM, QUALITATIVE: Preg, Serum: NEGATIVE

## 2023-11-12 NOTE — ED Notes (Signed)
 Patient transported to X-ray

## 2023-11-12 NOTE — ED Notes (Signed)
 The patient began yelling, stating she did not want to be in the hallway because people were staring at her. She demanded to be put in the lobby. This RN attempted to inform her that she needs to be seen by a PA first and then could go to the lobby but she interrupted me and continued to demand to go to the lobby and stated she did not want to be here. She then threw her pocketbook and then threw her cell phone on the floor, then called this RN a bitch. She said again that she wanted to leave and then stood up and walked out of the ER with independent steady gait.

## 2023-11-12 NOTE — ED Triage Notes (Signed)
 PER EMS: pt is from home with c/o left sided chest pain and back pain x 3 days  associated with shortness of breath. She reports increased stressors at home.   BP- 15/64, HR-86, O2-98%

## 2023-11-13 NOTE — ED Notes (Signed)
 Amber NT walked to the waiting room to see if the patient was going to stay to be seen or if she was choosing to leave. Amber found her in the waiting room and asked her if she was staying or leaving and she asked why and Amber told her she was asking because we needed to know if we needed to take her out of the system. Pt stated I dont give two shits and then stated she was leaving.

## 2023-12-02 ENCOUNTER — Ambulatory Visit (HOSPITAL_COMMUNITY)
Admission: EM | Admit: 2023-12-02 | Discharge: 2023-12-03 | Disposition: A | Discharge status: Other Acute Inpatient Hospital

## 2023-12-02 DIAGNOSIS — F333 Major depressive disorder, recurrent, severe with psychotic symptoms: Secondary | ICD-10-CM | POA: Insufficient documentation

## 2023-12-02 DIAGNOSIS — Z79899 Other long term (current) drug therapy: Secondary | ICD-10-CM | POA: Insufficient documentation

## 2023-12-02 DIAGNOSIS — G4700 Insomnia, unspecified: Secondary | ICD-10-CM | POA: Insufficient documentation

## 2023-12-02 DIAGNOSIS — F413 Other mixed anxiety disorders: Secondary | ICD-10-CM | POA: Insufficient documentation

## 2023-12-02 LAB — POCT URINE DRUG SCREEN - MANUAL ENTRY (I-SCREEN)
POC Amphetamine UR: NOT DETECTED
POC Buprenorphine (BUP): NOT DETECTED
POC Cocaine UR: NOT DETECTED
POC Marijuana UR: POSITIVE — AB
POC Methadone UR: NOT DETECTED
POC Methamphetamine UR: NOT DETECTED
POC Morphine: POSITIVE — AB
POC Oxazepam (BZO): NOT DETECTED
POC Oxycodone UR: POSITIVE — AB
POC Secobarbital (BAR): NOT DETECTED

## 2023-12-02 LAB — COMPREHENSIVE METABOLIC PANEL WITH GFR
ALT: 34 U/L (ref 0–44)
AST: 27 U/L (ref 15–41)
Albumin: 4.1 g/dL (ref 3.5–5.0)
Alkaline Phosphatase: 72 U/L (ref 38–126)
Anion gap: 12 (ref 5–15)
BUN: 7 mg/dL (ref 6–20)
CO2: 26 mmol/L (ref 22–32)
Calcium: 9.2 mg/dL (ref 8.9–10.3)
Chloride: 102 mmol/L (ref 98–111)
Creatinine, Ser: 0.7 mg/dL (ref 0.44–1.00)
GFR, Estimated: >60 mL/min (ref >60)
Glucose, Bld: 79 mg/dL (ref 70–99)
Potassium: 3.3 mmol/L — ABNORMAL LOW (ref 3.5–5.1)
Sodium: 140 mmol/L (ref 135–145)
Total Bilirubin: 0.4 mg/dL (ref 0.0–1.2)
Total Protein: 7.1 g/dL (ref 6.5–8.1)

## 2023-12-02 LAB — CBC WITH DIFFERENTIAL/PLATELET
Abs Immature Granulocytes: 0.03 K/uL (ref 0.00–0.07)
Basophils Absolute: 0.1 K/uL (ref 0.0–0.1)
Basophils Relative: 1 %
Eosinophils Absolute: 0.2 K/uL (ref 0.0–0.5)
Eosinophils Relative: 2 %
HCT: 44.6 % (ref 36.0–46.0)
Hemoglobin: 14.9 g/dL (ref 12.0–15.0)
Immature Granulocytes: 0 %
Lymphocytes Relative: 40 %
Lymphs Abs: 3.2 K/uL (ref 0.7–4.0)
MCH: 29.6 pg (ref 26.0–34.0)
MCHC: 33.4 g/dL (ref 30.0–36.0)
MCV: 88.5 fL (ref 80.0–100.0)
Monocytes Absolute: 0.4 K/uL (ref 0.1–1.0)
Monocytes Relative: 5 %
Neutro Abs: 4.1 K/uL (ref 1.7–7.7)
Neutrophils Relative %: 52 %
Platelets: 348 K/uL (ref 150–400)
RBC: 5.04 MIL/uL (ref 3.87–5.11)
RDW: 12.5 % (ref 11.5–15.5)
WBC: 7.9 K/uL (ref 4.0–10.5)
nRBC: 0 % (ref 0.0–0.2)

## 2023-12-02 LAB — URINALYSIS, ROUTINE W REFLEX MICROSCOPIC
Bilirubin Urine: NEGATIVE
Glucose, UA: NEGATIVE mg/dL
Hgb urine dipstick: NEGATIVE
Ketones, ur: NEGATIVE mg/dL
Leukocytes,Ua: NEGATIVE
Nitrite: NEGATIVE
Protein, ur: 30 mg/dL — AB
Specific Gravity, Urine: 1.026 (ref 1.005–1.030)
pH: 5 (ref 5.0–8.0)

## 2023-12-02 LAB — LIPID PANEL
Cholesterol: 187 mg/dL (ref 0–200)
HDL: 41 mg/dL (ref >40)
LDL Cholesterol: 116 mg/dL — ABNORMAL HIGH (ref 0–99)
Total CHOL/HDL Ratio: 4.6 ratio
Triglycerides: 151 mg/dL — ABNORMAL HIGH (ref <150)
VLDL: 30 mg/dL (ref 0–40)

## 2023-12-02 LAB — TSH: TSH: 1.813 u[IU]/mL (ref 0.350–4.500)

## 2023-12-02 LAB — HEMOGLOBIN A1C
Hgb A1c MFr Bld: 4.8 % (ref 4.8–5.6)
Mean Plasma Glucose: 91.06 mg/dL

## 2023-12-02 LAB — ETHANOL: Alcohol, Ethyl (B): <15 mg/dL (ref <15)

## 2023-12-02 LAB — MAGNESIUM: Magnesium: 2 mg/dL (ref 1.7–2.4)

## 2023-12-02 MED ORDER — DIPHENHYDRAMINE HCL 50 MG PO CAPS
50.0000 mg | ORAL_CAPSULE | Freq: Three times a day (TID) | ORAL | Status: DC | PRN
Start: 2023-12-02 — End: 2023-12-02

## 2023-12-02 MED ORDER — DIPHENHYDRAMINE HCL 50 MG/ML IJ SOLN: 50.0000 mg | Freq: Three times a day (TID) | INTRAMUSCULAR | Status: DC | PRN

## 2023-12-02 MED ORDER — GABAPENTIN 300 MG PO CAPS
300.0000 mg | ORAL_CAPSULE | Freq: Three times a day (TID) | ORAL | Status: DC
  Administered 2023-12-02: 300 mg via ORAL
  Filled 2023-12-02: qty 1

## 2023-12-02 MED ORDER — LORAZEPAM 2 MG/ML IJ SOLN: 2.0000 mg | Freq: Three times a day (TID) | INTRAMUSCULAR | Status: DC | PRN

## 2023-12-02 MED ORDER — TIZANIDINE HCL 2 MG PO TABS
2.0000 mg | ORAL_TABLET | Freq: Three times a day (TID) | ORAL | Status: DC | PRN
  Administered 2023-12-02: 2 mg via ORAL
  Filled 2023-12-02: qty 1

## 2023-12-02 MED ORDER — SERTRALINE HCL 50 MG PO TABS
50.0000 mg | ORAL_TABLET | Freq: Every day | ORAL | Status: DC
  Administered 2023-12-02: 50 mg via ORAL
  Filled 2023-12-02: qty 1

## 2023-12-02 MED ORDER — NICOTINE POLACRILEX 2 MG MT GUM
2.0000 mg | CHEWING_GUM | Freq: Once | OROMUCOSAL | Status: DC
  Filled 2023-12-02: qty 1

## 2023-12-02 MED ORDER — DIPHENHYDRAMINE HCL 50 MG PO CAPS: 50.0000 mg | ORAL_CAPSULE | Freq: Three times a day (TID) | ORAL | Status: DC | PRN

## 2023-12-02 MED ORDER — HALOPERIDOL LACTATE 5 MG/ML IJ SOLN: 5.0000 mg | Freq: Three times a day (TID) | INTRAMUSCULAR | Status: DC | PRN

## 2023-12-02 MED ORDER — HYDROCODONE-ACETAMINOPHEN 10-325 MG PO TABS: 2.0000 | ORAL_TABLET | Freq: Three times a day (TID) | ORAL | Status: DC | PRN

## 2023-12-02 MED ORDER — HALOPERIDOL LACTATE 5 MG/ML IJ SOLN: 10.0000 mg | Freq: Three times a day (TID) | INTRAMUSCULAR | Status: DC | PRN

## 2023-12-02 MED ORDER — ALUM & MAG HYDROXIDE-SIMETH 200-200-20 MG/5ML PO SUSP: 30.0000 mL | ORAL | Status: DC | PRN

## 2023-12-02 MED ORDER — ACETAMINOPHEN 325 MG PO TABS
650.0000 mg | ORAL_TABLET | Freq: Four times a day (QID) | ORAL | Status: DC | PRN
  Filled 2023-12-02: qty 2

## 2023-12-02 MED ORDER — HYDROXYZINE HCL 25 MG PO TABS: 25.0000 mg | ORAL_TABLET | Freq: Three times a day (TID) | ORAL | Status: DC | PRN

## 2023-12-02 MED ORDER — OLANZAPINE 5 MG PO TABS
5.0000 mg | ORAL_TABLET | Freq: Every day | ORAL | Status: DC
  Administered 2023-12-02: 5 mg via ORAL
  Filled 2023-12-02: qty 1

## 2023-12-02 MED ORDER — HALOPERIDOL 5 MG PO TABS: 5.0000 mg | ORAL_TABLET | Freq: Three times a day (TID) | ORAL | Status: DC | PRN

## 2023-12-02 MED ORDER — TRAZODONE HCL 50 MG PO TABS: 50.0000 mg | ORAL_TABLET | Freq: Every evening | ORAL | Status: DC | PRN

## 2023-12-02 MED ORDER — MAGNESIUM HYDROXIDE 400 MG/5ML PO SUSP: 30.0000 mL | Freq: Every day | ORAL | Status: DC | PRN

## 2023-12-02 MED ORDER — NICOTINE 14 MG/24HR TD PT24
14.0000 mg | MEDICATED_PATCH | Freq: Once | TRANSDERMAL | Status: DC
  Administered 2023-12-02: 14 mg via TRANSDERMAL
  Filled 2023-12-02: qty 1
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
## 2023-12-02 NOTE — ED Notes (Signed)
 Rachel Stuart arrived to the [facility] for observation. Patient is A&Ox4. Patient admits to suicidal ideations, stating that she had mutiple plans,where she either was going to crash her car, cut self, or OD on her medications. Patient denies any homicidal ideations. Pt contracts for safety. Patient denies any auditory hallucinations, visual hallucinations, or CAH. Skin check conducted by Corean PEAK and Brad, MHT. Patient oriented to the unit and offered  food, beverage, and snack. Patient verbalized understanding. Patient currently resting in bed. No distress noted.

## 2023-12-02 NOTE — ED Notes (Addendum)
 Patient in assessment room. Calm and cooperative. Patient educated on admission process. Patient verbalized understanding. Patient offered food and fluids. Patient refused at this time.
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
## 2023-12-02 NOTE — ED Notes (Signed)
 Patient currently sleeping and resting in recliner. RR even and unlabored, appearing in no noted distress. Environmental check complete

## 2023-12-02 NOTE — Progress Notes (Signed)
 Pt has been accepted to [facility] on 12/02/2023 . Bed assignment: 306-1   Pt meets inpatient criteria per Rockie Rams, NP   Attending Physician will be Dr. Prentis   Report can be called to: Adult unit: [phone]  Pt can arrive after pending labs and consents   Care Team Notified: [facility] Cherylynn Ernst, RN, Luke Sprang, RN, Rockie Rams, NP, Corean Blackwater, RN
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
## 2023-12-03 ENCOUNTER — Other Ambulatory Visit: Payer: Self-pay

## 2023-12-03 ENCOUNTER — Encounter (HOSPITAL_COMMUNITY): Payer: Self-pay | Admitting: Internal Medicine

## 2023-12-03 ENCOUNTER — Inpatient Hospital Stay (HOSPITAL_COMMUNITY)
Admission: AD | Admit: 2023-12-03 | Discharge: 2023-12-05 | DRG: 885 | Disposition: A | Discharge status: Home / Self Care | Source: Intra-hospital

## 2023-12-03 DIAGNOSIS — F17290 Nicotine dependence, other tobacco product, uncomplicated: Secondary | ICD-10-CM | POA: Diagnosis present

## 2023-12-03 DIAGNOSIS — Z8249 Family history of ischemic heart disease and other diseases of the circulatory system: Secondary | ICD-10-CM

## 2023-12-03 DIAGNOSIS — R45851 Suicidal ideations: Secondary | ICD-10-CM | POA: Diagnosis present

## 2023-12-03 DIAGNOSIS — Z823 Family history of stroke: Secondary | ICD-10-CM | POA: Diagnosis not present

## 2023-12-03 DIAGNOSIS — Z818 Family history of other mental and behavioral disorders: Secondary | ICD-10-CM

## 2023-12-03 DIAGNOSIS — Z833 Family history of diabetes mellitus: Secondary | ICD-10-CM | POA: Diagnosis not present

## 2023-12-03 DIAGNOSIS — F333 Major depressive disorder, recurrent, severe with psychotic symptoms: Principal | ICD-10-CM | POA: Diagnosis present

## 2023-12-03 DIAGNOSIS — Z6838 Body mass index (BMI) 38.0-38.9, adult: Secondary | ICD-10-CM | POA: Diagnosis not present

## 2023-12-03 DIAGNOSIS — G4700 Insomnia, unspecified: Secondary | ICD-10-CM | POA: Diagnosis present

## 2023-12-03 DIAGNOSIS — Z79899 Other long term (current) drug therapy: Secondary | ICD-10-CM

## 2023-12-03 DIAGNOSIS — Z8049 Family history of malignant neoplasm of other genital organs: Secondary | ICD-10-CM

## 2023-12-03 DIAGNOSIS — E669 Obesity, unspecified: Secondary | ICD-10-CM | POA: Diagnosis present

## 2023-12-03 DIAGNOSIS — F411 Generalized anxiety disorder: Secondary | ICD-10-CM | POA: Diagnosis present

## 2023-12-03 DIAGNOSIS — Z560 Unemployment, unspecified: Secondary | ICD-10-CM

## 2023-12-03 LAB — FOLATE: Folate: 10.6 ng/mL (ref >5.9)

## 2023-12-03 LAB — VITAMIN B12: Vitamin B-12: 404 pg/mL (ref 180–914)

## 2023-12-03 LAB — VITAMIN D 25 HYDROXY (VIT D DEFICIENCY, FRACTURES): Vit D, 25-Hydroxy: 13.18 ng/mL — ABNORMAL LOW (ref 30–100)

## 2023-12-03 MED ORDER — ACETAMINOPHEN 325 MG PO TABS
650.0000 mg | ORAL_TABLET | Freq: Four times a day (QID) | ORAL | Status: DC | PRN
  Administered 2023-12-03: 650 mg via ORAL
  Filled 2023-12-03: qty 2

## 2023-12-03 MED ORDER — FLUOXETINE HCL 10 MG PO CAPS: 10.0000 mg | ORAL_CAPSULE | Freq: Every day | ORAL | Status: DC

## 2023-12-03 MED ORDER — LORAZEPAM 2 MG/ML IJ SOLN: 2.0000 mg | Freq: Three times a day (TID) | INTRAMUSCULAR | Status: DC | PRN

## 2023-12-03 MED ORDER — ARIPIPRAZOLE 5 MG PO TABS
5.0000 mg | ORAL_TABLET | Freq: Every day | ORAL | Status: DC
  Administered 2023-12-03 – 2023-12-05 (×3): 5 mg via ORAL
  Filled 2023-12-03 (×3): qty 1

## 2023-12-03 MED ORDER — ARIPIPRAZOLE 5 MG PO TABS: 5.0000 mg | ORAL_TABLET | Freq: Every day | ORAL | Status: DC

## 2023-12-03 MED ORDER — POTASSIUM CHLORIDE CRYS ER 20 MEQ PO TBCR
40.0000 meq | EXTENDED_RELEASE_TABLET | Freq: Once | ORAL | Status: AC
  Administered 2023-12-03: 40 meq via ORAL
  Filled 2023-12-03: qty 2

## 2023-12-03 MED ORDER — FLUOXETINE HCL 10 MG PO CAPS
10.0000 mg | ORAL_CAPSULE | Freq: Every day | ORAL | Status: DC
  Administered 2023-12-03 – 2023-12-04 (×2): 10 mg via ORAL
  Filled 2023-12-03 (×2): qty 1

## 2023-12-03 MED ORDER — GABAPENTIN 300 MG PO CAPS
300.0000 mg | ORAL_CAPSULE | Freq: Three times a day (TID) | ORAL | Status: DC
Start: 2023-12-04 — End: 2023-12-05
  Administered 2023-12-04 – 2023-12-05 (×4): 300 mg via ORAL
  Filled 2023-12-03 (×4): qty 1

## 2023-12-03 MED ORDER — TIZANIDINE HCL 2 MG PO TABS
4.0000 mg | ORAL_TABLET | Freq: Three times a day (TID) | ORAL | Status: DC | PRN
  Administered 2023-12-03 – 2023-12-04 (×2): 4 mg via ORAL
  Filled 2023-12-03 (×2): qty 2

## 2023-12-03 MED ORDER — DIPHENHYDRAMINE HCL 50 MG/ML IJ SOLN: 50.0000 mg | Freq: Three times a day (TID) | INTRAMUSCULAR | Status: DC | PRN

## 2023-12-03 MED ORDER — HALOPERIDOL LACTATE 5 MG/ML IJ SOLN: 5.0000 mg | Freq: Three times a day (TID) | INTRAMUSCULAR | Status: DC | PRN

## 2023-12-03 MED ORDER — DIPHENHYDRAMINE HCL 25 MG PO CAPS: 50.0000 mg | ORAL_CAPSULE | Freq: Three times a day (TID) | ORAL | Status: DC | PRN

## 2023-12-03 MED ORDER — HALOPERIDOL LACTATE 5 MG/ML IJ SOLN: 10.0000 mg | Freq: Three times a day (TID) | INTRAMUSCULAR | Status: DC | PRN

## 2023-12-03 MED ORDER — HALOPERIDOL 5 MG PO TABS: 5.0000 mg | ORAL_TABLET | Freq: Three times a day (TID) | ORAL | Status: DC | PRN

## 2023-12-03 NOTE — Progress Notes (Signed)
   12/03/23 0900  Psych Admission Type (Psych Patients Only)  Admission Status Voluntary  Psychosocial Assessment  Patient Complaints Depression  Eye Contact Brief  Facial Expression Flat  Affect Apprehensive  Speech Logical/coherent  Interaction Cautious  Motor Activity Fidgety  Appearance/Hygiene In scrubs  Behavior Characteristics Cooperative  Mood Depressed  Thought Process  Coherency WDL  Content WDL  Delusions None reported or observed  Perception Hallucinations  Hallucination Auditory  Judgment Poor  Confusion None  Danger to Self  Current suicidal ideation? Denies  Self-Injurious Behavior No self-injurious ideation or behavior indicators observed or expressed   Agreement Not to Harm Self Yes  Description of Agreement verbal  Danger to Others  Danger to Others None reported or observed

## 2023-12-03 NOTE — Group Note (Signed)
 LCSW Group Therapy Note   Group Date: 12/03/2023 Start Time: 1100 End Time: 1200   Participation:  did not attend  Type of Therapy:  Group Therapy  Topic:  Healing Hearts:  A Safe Space for Grief  Objective:  The objective of this class, Healing Hearts: A Safe Space for Grief, is to create a compassionate environment where participants can process their grief, explore different stages of grief, and discover ways to honor their loved ones through personal rituals.  3 Goals: Provide a safe and supportive space where participants feel comfortable sharing their feelings and experiences of grief without judgment. Educate participants about the stages of grief and emphasize that there is no right way to grieve or a fixed timeline for healing. Introduce the concept of rituals as a means to process grief, allowing individuals to honor their loved ones in a personal and meaningful way.  Summary:  In Healing Hearts: A Safe Space for Grief, we explored the unique and personal journey of grief, emphasizing that everyone experiences it differently. We discussed the five stages of grief (denial, anger, bargaining, depression, and acceptance), with the understanding that grief is not linear. Rituals were introduced as a way to help cope with loss, offering comfort and connection through meaningful actions such as lighting candles or taking memory walks. Participants were encouraged to express their emotions, focus on self-care, and reflect on moments of gratitude for their loved ones, recognizing that healing is a process and there is no timeline for grief.  Therapeutic Modalities: Psychoeducation:  5 Stages of Grief Elements of DBT:  mindfulness and grounding for distress tolerance   Rachel Stuart Rachel Stuart, LCSWA 12/03/2023  1:00 PM

## 2023-12-03 NOTE — ED Notes (Signed)
 Pt sleeping at present, no distress noted.  Monitoring for safety.

## 2023-12-03 NOTE — Plan of Care (Signed)
   Problem: Education: Goal: Knowledge of Rachel Stuart [facility] Education information/materials will improve Outcome: Progressing Goal: Emotional status will improve Outcome: Progressing Goal: Mental status will improve Outcome: Progressing Goal: Verbalization of understanding the information provided will improve Outcome: Progressing   Problem: Activity: Goal: Interest or engagement in activities will improve Outcome: Progressing Goal: Sleeping patterns will improve Outcome: Progressing   Problem: Coping: Goal: Ability to verbalize frustrations and anger appropriately will improve Outcome: Progressing Goal: Ability to demonstrate self-control will improve Outcome: Progressing

## 2023-12-03 NOTE — ED Notes (Signed)
 Report called to RN [place], [facility] rm [phone]  Pending Safe Transport.

## 2023-12-03 NOTE — [facility] Group Notes (Signed)
 Adult Psychoeducational Group Note  Date:  12/03/2023 Time:  10:01 AM  Group Topic/Focus:  Goals Group:   The focus of this group is to help patients establish daily goals to achieve during treatment and discuss how the patient can incorporate goal setting into their daily lives to aide in recovery. Orientation:   The focus of this group is to educate the patient on the purpose and policies of crisis stabilization and provide a format to answer questions about their admission.  The group details unit policies and expectations of patients while admitted.  Participation Level:  Active  Participation Quality:  Appropriate  Affect:  Appropriate  Cognitive:  Appropriate  Insight: Appropriate  Engagement in Group:  Engaged  Modes of Intervention:  Orientation  Additional Comments:  Pt goal for today is to work on getting discharged.   Niel CHRISTELLA Nightingale 12/03/2023, 10:01 AM

## 2023-12-03 NOTE — H&P (Addendum)
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 times daily as needed for muscle spasm  Other PRN Medications  -Acetaminophen  650 mg every 6 as needed/mild pain  -Maalox 30 mL oral every 4 as needed/digestion  -Magnesium  hydroxide 30 mL daily as needed/mild constipation   --The risks/benefits/side-effects/alternatives to this medication were discussed in detail with the patient and time was given for questions. The patient consents to medication trial.   -- Metabolic profile and EKG monitoring obtained while on an atypical antipsychotic (BMI: Lipid Panel: HbgA1c: QTc:)   --  Encouraged patient to participate in unit milieu and in scheduled group therapies   Safety and Monitoring:  Voluntary admission to inpatient psychiatric unit for safety, stabilization and treatment  Daily contact with patient to assess and evaluate symptoms and progress in treatment  Patient's case to be discussed in multi-disciplinary team meeting  Observation Level : q15 minute checks  Vital signs: q12 hours  Precautions: suicide, but pt currently verbally contracts for safety on unit?   Discharge Planning:  Social work and case management to assist with discharge planning and identification of hospital follow-up needs prior to discharge  Estimated LOS: 5-7?days  Discharge Concerns: Need to establish safety plan; Medication compliance and effectiveness  Discharge Goals: Return home with outpatient referrals for mental health follow-up including medication management/psychotherapy.   Long  Term Goal(s): Improvement in symptoms so as ready for discharge  Short Term Goals: Ability to identify changes in lifestyle to reduce recurrence of condition will improve, Ability to verbalize feelings will improve, Ability to disclose and discuss suicidal ideas, Ability to demonstrate self-control will improve, Ability to identify and develop effective coping behaviors will improve, Ability to maintain clinical measurements within normal limits will improve, Compliance with prescribed medications will improve, and Ability to identify triggers associated with substance abuse/mental health issues will improve  Physician Treatment Plan for Secondary Diagnosis: Principal Problem:   MDD (major depressive disorder), recurrent, severe, with psychosis (HCC)  Long Term Goal(s): Improvement in symptoms so as ready for discharge  Short Term Goals: Ability to identify changes in lifestyle to reduce recurrence of condition will improve, Ability to verbalize feelings will improve, Ability to disclose and discuss  suicidal ideas, Ability to demonstrate self-control will improve, Ability to identify and develop effective coping behaviors will improve, Ability to maintain clinical measurements within normal limits will improve, Compliance with prescribed medications will improve, and Ability to identify triggers associated with substance abuse/mental health issues will improve  I certify that inpatient services furnished can reasonably be expected to improve the patient's condition.    Jumaane Weatherford C Ysabel Stankovich, FNP 9/4/202510:49 AM
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
## 2023-12-03 NOTE — ED Notes (Signed)
Safe Transport requested to [facility]. 
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
## 2023-12-04 ENCOUNTER — Encounter (HOSPITAL_COMMUNITY): Payer: Self-pay

## 2023-12-04 LAB — BASIC METABOLIC PANEL WITH GFR
Anion gap: 10 (ref 5–15)
BUN: 7 mg/dL (ref 6–20)
CO2: 23 mmol/L (ref 22–32)
Calcium: 9.3 mg/dL (ref 8.9–10.3)
Chloride: 108 mmol/L (ref 98–111)
Creatinine, Ser: 0.62 mg/dL (ref 0.44–1.00)
GFR, Estimated: >60 mL/min (ref >60)
Glucose, Bld: 101 mg/dL — ABNORMAL HIGH (ref 70–99)
Potassium: 4.1 mmol/L (ref 3.5–5.1)
Sodium: 141 mmol/L (ref 135–145)

## 2023-12-04 MED ORDER — FLUOXETINE HCL 20 MG PO CAPS
20.0000 mg | ORAL_CAPSULE | Freq: Every day | ORAL | Status: DC
  Administered 2023-12-05: 20 mg via ORAL
  Filled 2023-12-04: qty 1

## 2023-12-04 MED ORDER — CLONIDINE HCL 0.1 MG PO TABS: 0.1000 mg | ORAL_TABLET | Freq: Every day | ORAL | Status: DC | PRN

## 2023-12-04 MED ORDER — ONDANSETRON 4 MG PO TBDP
4.0000 mg | ORAL_TABLET | Freq: Three times a day (TID) | ORAL | Status: DC | PRN
  Administered 2023-12-04: 4 mg via ORAL
  Filled 2023-12-04 (×2): qty 1

## 2023-12-04 NOTE — Plan of Care (Signed)
   Problem: Education: Goal: Emotional status will improve Outcome: Not Progressing Goal: Mental status will improve Outcome: Not Progressing

## 2023-12-04 NOTE — [facility] Group Notes (Signed)
 [facility] Group Notes:  (Nursing/MHT/Case Management/Adjunct)  Date:  12/04/2023  Time:  12:01 AM  Type of Therapy:  Wrap-up group  Participation Level:  Did Not Attend  Participation Quality:    Affect:    Cognitive:    Insight:    Engagement in Group:    Modes of Intervention:    Summary of Progress/Problems:  Grayce LITTIE Essex 12/04/2023, 12:01 AM

## 2023-12-04 NOTE — Plan of Care (Signed)
  Problem: Education: Goal: Knowledge of [facility] Education information/materials will improve 12/04/2023 0611 by Roque Claudius HERO, RN Outcome: Progressing 12/04/2023 0541 by Roque Claudius HERO, RN Outcome: Progressing Goal: Emotional status will improve 12/04/2023 0611 by Roque Claudius HERO, RN Outcome: Progressing 12/04/2023 0541 by Roque Claudius HERO, RN Outcome: Progressing Goal: Mental status will improve 12/04/2023 0611 by Roque Claudius HERO, RN Outcome: Progressing 12/04/2023 0541 by Roque Claudius HERO, RN Outcome: Progressing Goal: Verbalization of understanding the information provided will improve 12/04/2023 0611 by Roque Claudius HERO, RN Outcome: Progressing 12/04/2023 0541 by Roque Claudius HERO, RN Outcome: Progressing

## 2023-12-04 NOTE — Group Note (Signed)
 Date:  12/04/2023 Time:  9:22 PM  Group Topic/Focus:  Wrap-Up Group:   The focus of this group is to help patients review their daily goal of treatment and discuss progress on daily workbooks.    Additional Comments:  Pt attended the AA meeting without disruption.  Rachel Stuart 12/04/2023, 9:22 PM

## 2023-12-04 NOTE — Progress Notes (Signed)
   12/04/23 1000  Psych Admission Type (Psych Patients Only)  Admission Status Voluntary  Psychosocial Assessment  Patient Complaints Depression  Eye Contact Brief  Facial Expression Flat  Affect Anxious  Speech Logical/coherent  Interaction Cautious  Motor Activity Slow  Appearance/Hygiene In scrubs  Behavior Characteristics Cooperative  Mood Depressed  Thought Process  Coherency WDL  Content WDL  Delusions None reported or observed  Perception WDL  Hallucination None reported or observed  Judgment Poor  Confusion None  Danger to Self  Current suicidal ideation? Denies  Danger to Others  Danger to Others None reported or observed   DAR NOTE: Patient presents with flat affect and depressed mood.  Denies suicidal thoughts, pain, auditory and visual hallucinations.  Rates depression at 3, hopelessness at 3, and anxiety at 3.  Maintained on routine safety checks.  Medications given as prescribed.  Support and encouragement offered as needed.  Attended group and participated.  States goal for today is going home to my kids.  Patient observed socializing with peers in the dayroom.  Patient preoccupied with getting discharge.  Patient is safe on and off the unit.
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
## 2023-12-04 NOTE — Group Note (Signed)
 Recreation Therapy Group Note   Group Topic:Team Building  Group Date: 12/04/2023 Start Time: 0935 End Time: 1005 Facilitators: Dajsha Massaro-McCall, LRT,CTRS Location: [address] Dayroom   Group Topic: Communication, Team Building, Problem Solving  Goal Area(s) Addresses:  Patient will effectively work with peer towards shared goal.  Patient will identify skills used to make activity successful.  Patient will share challenges and verbalize solution-driven approaches used. Patient will identify how skills used during activity can be used to reach post d/c goals.   Behavioral Response: Observant  Intervention: STEM Activity   Activity: [employer]. Patients were provided the following materials: 4 drinking straws, 5 rubber bands, 5 paper clips, 2 index cards and 2 drinking cups. Using the provided materials patients were asked to build a launching mechanism to launch a ping pong ball across the room, approximately 10 feet. Patients were divided into teams of 3-5. Instructions required all materials be incorporated into the device, functionality of items left to the peer group's discretion.  Education: Pharmacist, community, Scientist, physiological, Air cabin crew, [name].   Education Outcome: Acknowledges education/In group clarification    Affect/Mood: Appropriate   Participation Level: Minimal   Participation Quality: Independent   Behavior: On-looking   Speech/Thought Process: Relevant   Insight: Moderate   Judgement: Moderate   Modes of Intervention: STEM Activity   Patient Response to Interventions:  Attentive   Education Outcome:  In group clarification offered    Clinical Observations/Individualized Feedback: Pt was quiet for the most part but would offer ideas every now and then. Pt was bright and attentive throughout group.      Plan: Continue to engage patient in RT group sessions 2-3x/week.   Nikolis Berent-McCall, LRT,CTRS 12/04/2023 11:57 AM

## 2023-12-04 NOTE — Progress Notes (Signed)
   12/04/23 2115  Psych Admission Type (Psych Patients Only)  Admission Status Voluntary  Psychosocial Assessment  Patient Complaints None  Eye Contact Fair  Facial Expression Flat  Affect Appropriate to circumstance  Speech Logical/coherent  Interaction Assertive  Motor Activity Other (Comment) (WNL)  Appearance/Hygiene In scrubs  Behavior Characteristics Appropriate to situation  Mood Pleasant  Thought Process  Coherency WDL  Content WDL  Delusions None reported or observed  Perception WDL  Hallucination None reported or observed  Judgment Poor  Confusion None  Danger to Self  Current suicidal ideation? Passive (Currently Denies)  Agreement Not to Harm Self Yes  Description of Agreement Notify Staff  Danger to Others  Danger to Others None reported or observed

## 2023-12-04 NOTE — BH IP Treatment Plan (Signed)
 Interdisciplinary Treatment and Diagnostic Plan Update  12/04/2023 Time of Session: 10:45 AM Garry Bochicchio MRN: [id number]  Principal Diagnosis: MDD (major depressive disorder), recurrent, severe, with psychosis (HCC)  Secondary Diagnoses: Principal Problem:   MDD (major depressive disorder), recurrent, severe, with psychosis (HCC)   Current Medications:  Current Facility-Administered Medications  Medication Dose Route Frequency Provider Last Rate Last Admin   acetaminophen  (TYLENOL ) tablet 650 mg  650 mg Oral Q6H PRN Trudy Carwin, NP   650 mg at 12/03/23 1954   ARIPiprazole  (ABILIFY ) tablet 5 mg  5 mg Oral Daily Ntuen, Tina C, FNP   5 mg at 12/04/23 9246   haloperidol  (HALDOL ) tablet 5 mg  5 mg Oral TID PRN Onuoha, Chinwendu V, NP       And   diphenhydrAMINE  (BENADRYL ) capsule 50 mg  50 mg Oral TID PRN Onuoha, Chinwendu V, NP       haloperidol  lactate (HALDOL ) injection 5 mg  5 mg Intramuscular TID PRN Onuoha, Chinwendu V, NP       And   diphenhydrAMINE  (BENADRYL ) injection 50 mg  50 mg Intramuscular TID PRN Onuoha, Chinwendu V, NP       And   LORazepam  (ATIVAN ) injection 2 mg  2 mg Intramuscular TID PRN Onuoha, Chinwendu V, NP       haloperidol  lactate (HALDOL ) injection 10 mg  10 mg Intramuscular TID PRN Onuoha, Chinwendu V, NP       And   diphenhydrAMINE  (BENADRYL ) injection 50 mg  50 mg Intramuscular TID PRN Onuoha, Chinwendu V, NP       And   LORazepam  (ATIVAN ) injection 2 mg  2 mg Intramuscular TID PRN Onuoha, Chinwendu V, NP       [START ON 12/05/2023] FLUoxetine  (PROZAC ) capsule 20 mg  20 mg Oral Daily Ntuen, Tina C, FNP       gabapentin  (NEURONTIN ) capsule 300 mg  300 mg Oral TID Trudy Carwin, NP   300 mg at 12/04/23 1636   tiZANidine  (ZANAFLEX ) tablet 4 mg  4 mg Oral Q8H PRN Trudy Carwin, NP   4 mg at 12/03/23 2328   PTA Medications: Medications Prior to Admission  Medication Sig Dispense Refill Last Dose/Taking   HYDROcodone -acetaminophen  (NORCO) 10-325 MG tablet Take  1 tablet by mouth 3 (three) times daily as needed for moderate pain (pain score 4-6).   Taking As Needed   gabapentin  (NEURONTIN ) 300 MG capsule Take 300 mg by mouth 3 (three) times daily.      naloxone (NARCAN) nasal spray 4 mg/0.1 mL Place 0.4 mg into the nose once.      ondansetron  (ZOFRAN -ODT) 4 MG disintegrating tablet Take 4 mg by mouth every 8 (eight) hours as needed for nausea.      SUMAtriptan (IMITREX) 50 MG tablet Take 50 mg by mouth every 2 (two) hours as needed for migraine.      tiZANidine  (ZANAFLEX ) 4 MG tablet Take 4 mg by mouth 3 (three) times daily as needed.       Patient Stressors: Marital or family conflict   Medication change or noncompliance   Other: children's welfare     Patient Strengths: Average or above average intelligence  Communication skills  General fund of knowledge  Motivation for treatment/growth  Supportive family/friends   Treatment Modalities: Medication Management, Group therapy, Case management,  1 to 1 session with clinician, Psychoeducation, Recreational therapy.   Physician Treatment Plan for Primary Diagnosis: MDD (major depressive disorder), recurrent, severe, with psychosis (HCC) Long Term Goal(s): Improvement  in symptoms so as ready for discharge   Short Term Goals: Ability to identify changes in lifestyle to reduce recurrence of condition will improve Ability to verbalize feelings will improve Ability to disclose and discuss suicidal ideas Ability to demonstrate self-control will improve Ability to identify and develop effective coping behaviors will improve Ability to maintain clinical measurements within normal limits will improve Compliance with prescribed medications will improve Ability to identify triggers associated with substance abuse/mental health issues will improve  Medication Management: Evaluate patient's response, side effects, and tolerance of medication regimen.  Therapeutic Interventions: 1 to 1 sessions, Unit  Group sessions and Medication administration.  Evaluation of Outcomes: Not Progressing  Physician Treatment Plan for Secondary Diagnosis: Principal Problem:   MDD (major depressive disorder), recurrent, severe, with psychosis (HCC)  Long Term Goal(s): Improvement in symptoms so as ready for discharge   Short Term Goals: Ability to identify changes in lifestyle to reduce recurrence of condition will improve Ability to verbalize feelings will improve Ability to disclose and discuss suicidal ideas Ability to demonstrate self-control will improve Ability to identify and develop effective coping behaviors will improve Ability to maintain clinical measurements within normal limits will improve Compliance with prescribed medications will improve Ability to identify triggers associated with substance abuse/mental health issues will improve     Medication Management: Evaluate patient's response, side effects, and tolerance of medication regimen.  Therapeutic Interventions: 1 to 1 sessions, Unit Group sessions and Medication administration.  Evaluation of Outcomes: Not Progressing   RN Treatment Plan for Primary Diagnosis: MDD (major depressive disorder), recurrent, severe, with psychosis (HCC) Long Term Goal(s): Knowledge of disease and therapeutic regimen to maintain health will improve  Short Term Goals: Ability to remain free from injury will improve, Ability to verbalize frustration and anger appropriately will improve, Ability to demonstrate self-control, Ability to participate in decision making will improve, Ability to verbalize feelings will improve, Ability to disclose and discuss suicidal ideas, Ability to identify and develop effective coping behaviors will improve, and Compliance with prescribed medications will improve  Medication Management: RN will administer medications as ordered by provider, will assess and evaluate patient's response and provide education to patient for  prescribed medication. RN will report any adverse and/or side effects to prescribing provider.  Therapeutic Interventions: 1 on 1 counseling sessions, Psychoeducation, Medication administration, Evaluate responses to treatment, Monitor vital signs and CBGs as ordered, Perform/monitor CIWA, COWS, AIMS and Fall Risk screenings as ordered, Perform wound care treatments as ordered.  Evaluation of Outcomes: Not Progressing   LCSW Treatment Plan for Primary Diagnosis: MDD (major depressive disorder), recurrent, severe, with psychosis (HCC) Long Term Goal(s): Safe transition to appropriate next level of care at discharge, Engage patient in therapeutic group addressing interpersonal concerns.  Short Term Goals: Engage patient in aftercare planning with referrals and resources, Increase social support, Increase ability to appropriately verbalize feelings, Increase emotional regulation, Facilitate acceptance of mental health diagnosis and concerns, Facilitate patient progression through stages of change regarding substance use diagnoses and concerns, Identify triggers associated with mental health/substance abuse issues, and Increase skills for wellness and recovery  Therapeutic Interventions: Assess for all discharge needs, 1 to 1 time with Social worker, Explore available resources and support systems, Assess for adequacy in community support network, Educate family and significant other(s) on suicide prevention, Complete Psychosocial Assessment, Interpersonal group therapy.  Evaluation of Outcomes: Not Progressing   Progress in Treatment: Attending groups: attended some groups Participating in groups: Yes. Taking medication as prescribed: Yes. Toleration medication:  Yes. Family/Significant other contact made: No, will contact:  consents are pending Patient understands diagnosis: Yes. Discussing patient identified problems/goals with staff: Yes. Medical problems stabilized or resolved: Yes. Denies  suicidal/homicidal ideation: Yes. Issues/concerns per patient self-inventory: No.  New problem(s) identified:  No  New Short Term/Long Term Goal(s):   medication stabilization, elimination of SI thoughts, development of comprehensive mental wellness plan.    Patient Goals:  I want to work on myself, my anxiety, and my mood so I can be a better mother to my kids.  I'm making progress with my medications, and I would like to go home to be with my babies.   Discharge Plan or Barriers:  Patient recently admitted. CSW will continue to follow and assess for appropriate referrals and possible discharge planning.    Reason for Continuation of Hospitalization: Anxiety Depression Medication stabilization  Estimated Length of Stay: 5 - 7 days  Last 3 [place] Suicide Severity Risk Score: Flowsheet Row [facility] (Current[facility]20[facility] ED from [facility]12/2023 in [facility]  C-SSRS RISK CATEGORY Moderate Risk Moderate Risk No Risk    Last [facility] 2/9 Scores:    09/26/2020    3:20 PM 08/04/2014    3:06 PM  Depression screen PHQ 2/9  Decreased Interest 2 0  Down, Depressed, Hopeless 2 0  PHQ - 2 Score 4 0  Altered sleeping 2   Tired, decreased energy 2   Change in appetite 2   Feeling bad or failure about yourself  1   Trouble concentrating 1   Moving slowly or fidgety/restless 1   Suicidal thoughts 0   PHQ-9 Score 13     Scribe for Treatment Team: Rithik Odea O Kaylana Fenstermacher, LCSWA 12/04/2023 5:13 PM

## 2023-12-04 NOTE — Group Note (Signed)
 Date:  12/04/2023 Time:  4:01 PM  Group Topic/Focus:  Medication Side effect    Participation Level:  Active  Participation Quality:  Appropriate  Affect:  Appropriate  Cognitive:  Alert  Insight: Appropriate  Engagement in Group:  Engaged  Modes of Intervention:  Discussion    Almarie MALVA Lowers 12/04/2023, 4:01 PM

## 2023-12-04 NOTE — Group Note (Signed)
 Date:  12/04/2023 Time:  9:08 AM  Group Topic/Focus:  Goals Group:   The focus of this group is to help patients establish daily goals to achieve during treatment and discuss how the patient can incorporate goal setting into their daily lives to aide in recovery.    Participation Level:  Active  Participation Quality:  Appropriate  Affect:  Appropriate  Cognitive:  Appropriate  Insight: Appropriate  Engagement in Group:  Engaged  Modes of Intervention:  Discussion  Additional Comments:  pt plans to speak to the doctor about possible discharge  Nat Rummer 12/04/2023, 9:08 AM

## 2023-12-04 NOTE — Plan of Care (Signed)
   Problem: Education: Goal: Knowledge of [place] [facility] Education information/materials will improve Outcome: Progressing Goal: Emotional status will improve Outcome: Progressing Goal: Mental status will improve Outcome: Progressing Goal: Verbalization of understanding the information provided will improve Outcome: Progressing

## 2023-12-05 ENCOUNTER — Encounter (HOSPITAL_COMMUNITY): Payer: Self-pay | Admitting: Internal Medicine

## 2023-12-05 DIAGNOSIS — F333 Major depressive disorder, recurrent, severe with psychotic symptoms: Principal | ICD-10-CM

## 2023-12-05 MED ORDER — ARIPIPRAZOLE 5 MG PO TABS: 5.0000 mg | ORAL_TABLET | Freq: Every day | ORAL | 0 refills | Status: AC

## 2023-12-05 MED ORDER — FLUOXETINE HCL 20 MG PO CAPS: 20.0000 mg | ORAL_CAPSULE | Freq: Every day | ORAL | 0 refills | Status: AC

## 2023-12-05 NOTE — BH Assessment (Signed)
(  Sleep Hours) - 8 (Any PRNs that were needed, meds refused, or side effects to meds)- None (Any disturbances and when (visitation, over night)- None (Concerns raised by the patient)- None (SI/HI/AVH)- Denies
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
## 2023-12-05 NOTE — Group Note (Signed)
 Date:  12/05/2023 Time:  9:02 AM  Group Topic/Focus:  Goals Group:   The focus of this group is to help patients establish daily goals to achieve during treatment and discuss how the patient can incorporate goal setting into their daily lives to aide in recovery.    Participation Level:  Active  Participation Quality:  Appropriate  Affect:  Appropriate  Cognitive:  Appropriate  Insight: Appropriate  Engagement in Group:  Engaged  Modes of Intervention:  Discussion  Additional Comments:  NA  Abbagale Goguen A Shaneequa Bahner 12/05/2023, 9:02 AM

## 2023-12-05 NOTE — Progress Notes (Signed)
 Patient discharged via home. Given all her belongings and she verbalized understanding.

## 2023-12-05 NOTE — [facility] Suicide Risk Assessment (Signed)
 [facility] INPATIENT:  Family/Significant Other Suicide Prevention Education  Suicide Prevention Education:  Education Completed;  Nanetta, sister, [phone], has been identified by the patient as the family member/significant other with whom the patient will be residing, and identified as the person(s) who will aid the patient in the event of a mental health crisis (suicidal ideations/suicide attempt).  With written consent from the patient, the family member/significant other has been provided the following suicide prevention education, prior to the and/or following the discharge of the patient.  The suicide prevention education provided includes the following: Suicide risk factors Suicide prevention and interventions National Suicide Hotline telephone number [facility] assessment telephone number [facility] Emergency Assistance 911 [facility] and/or Residential Mobile Crisis Unit telephone number  Request made of family/significant other to: Remove weapons (e.g., guns, rifles, knives), all items previously/currently identified as safety concern.   Remove drugs/medications (over-the-counter, prescriptions, illicit drugs), all items previously/currently identified as a safety concern.  The family member/significant other verbalizes understanding of the suicide prevention education information provided.  The family member/significant other agrees to remove the items of safety concern listed above.  Roselyn GORMAN Lento 12/05/2023, 2:48 PM

## 2023-12-05 NOTE — Progress Notes (Addendum)
  [facility] Adult Case Management Discharge Plan :  Will you be returning to the same living situation after discharge:  Yes,  [address] At discharge, do you have transportation home?: Yes,  patient sister provided transport Do you have the ability to pay for your medications: Yes,  Patient has insurance  Release of information consent forms completed and in the chart;  Patient's signature needed at discharge.  Patient to Follow up at:  Follow-up Information     [facility] Follow up on 12/09/2023.   Why: You have a hospital follow up appointment for therapy and medication management services on 12/09/23 at 9:00 am . The appointment will be Virtual, telehealth. Contact information: [address]  [address] [place] [address] [phone]                 Next level of care provider has access to [facility] Link:yes  Safety Planning and Suicide Prevention discussed: Chaney Muskrat, sister, [phone]     Has patient been referred to the Quitline?: Patient does not use tobacco/nicotine  products  Patient has been referred for addiction treatment: Yes, the patient will follow up with an outpatient provider for substance use disorder. Therapist: appointment made  Roselyn GORMAN Lento, LCSW 12/05/2023, 12:16 PM

## 2023-12-05 NOTE — Plan of Care (Signed)
   Problem: Education: Goal: Knowledge of [place] [facility] Education information/materials will improve Outcome: Progressing Goal: Emotional status will improve Outcome: Progressing Goal: Mental status will improve Outcome: Progressing Goal: Verbalization of understanding the information provided will improve Outcome: Progressing
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
## 2023-12-15 ENCOUNTER — Emergency Department (HOSPITAL_COMMUNITY)

## 2023-12-15 ENCOUNTER — Emergency Department (HOSPITAL_COMMUNITY)
Admission: EM | Admit: 2023-12-15 | Discharge: 2023-12-16 | Discharge status: Against Medical Advice | Attending: Emergency Medicine | Admitting: Emergency Medicine

## 2023-12-15 DIAGNOSIS — R0789 Other chest pain: Secondary | ICD-10-CM | POA: Insufficient documentation

## 2023-12-15 DIAGNOSIS — R0602 Shortness of breath: Secondary | ICD-10-CM | POA: Diagnosis not present

## 2023-12-15 DIAGNOSIS — R059 Cough, unspecified: Secondary | ICD-10-CM | POA: Insufficient documentation

## 2023-12-15 DIAGNOSIS — M7989 Other specified soft tissue disorders: Secondary | ICD-10-CM | POA: Insufficient documentation

## 2023-12-15 DIAGNOSIS — Z5321 Procedure and treatment not carried out due to patient leaving prior to being seen by health care provider: Secondary | ICD-10-CM | POA: Insufficient documentation

## 2023-12-15 LAB — BASIC METABOLIC PANEL WITH GFR
Anion gap: 12 (ref 5–15)
BUN: 13 mg/dL (ref 6–20)
CO2: 20 mmol/L — ABNORMAL LOW (ref 22–32)
Calcium: 9.1 mg/dL (ref 8.9–10.3)
Chloride: 107 mmol/L (ref 98–111)
Creatinine, Ser: 0.75 mg/dL (ref 0.44–1.00)
GFR, Estimated: >60 mL/min (ref >60)
Glucose, Bld: 82 mg/dL (ref 70–99)
Potassium: 3.9 mmol/L (ref 3.5–5.1)
Sodium: 139 mmol/L (ref 135–145)

## 2023-12-15 LAB — CBC
HCT: 41.1 % (ref 36.0–46.0)
Hemoglobin: 13.5 g/dL (ref 12.0–15.0)
MCH: 29.7 pg (ref 26.0–34.0)
MCHC: 32.8 g/dL (ref 30.0–36.0)
MCV: 90.5 fL (ref 80.0–100.0)
Platelets: 379 K/uL (ref 150–400)
RBC: 4.54 MIL/uL (ref 3.87–5.11)
RDW: 12.3 % (ref 11.5–15.5)
WBC: 8.3 K/uL (ref 4.0–10.5)
nRBC: 0 % (ref 0.0–0.2)

## 2023-12-15 LAB — HCG, SERUM, QUALITATIVE: Preg, Serum: NEGATIVE

## 2023-12-15 LAB — TROPONIN I (HIGH SENSITIVITY): Troponin I (High Sensitivity): 3 ng/L (ref <18)

## 2023-12-15 NOTE — ED Notes (Signed)
 Pt was called 6x for vitals from both techs, and we received no answer.

## 2023-12-15 NOTE — ED Triage Notes (Signed)
 Pt BIB EMS from home for chest pain started 4 days ago that gets worse on exertion, starts on center and left side of chest. Tender on palpation. Pt says she had a few dizzy spells this morning when moving, feeling more SOB. Seen for same in July, and had elevated troponins and low potassium according to paramedic on scene. AOX4.

## 2023-12-15 NOTE — ED Provider Triage Note (Signed)
 Emergency Medicine Provider Triage Evaluation Note  Rachel Stuart , a 28 y.o. female  was evaluated in triage.  Pt complains of pain shortness of breath cough and bilateral lower extremity swelling for 3 days..  Review of Systems  Positive: CP Negative: FEver  Physical Exam  BP 108/71   Pulse 71   Temp 99 F (37.2 C)   Resp 18   Ht 5' 3 (1.6 m)   Wt 98.4 kg   SpO2 98%   BMI 38.44 kg/m  Gen:   Awake, no distress   Resp:  Normal effort  MSK:   Moves extremities without difficulty  Other:    Medical Decision Making  Medically screening exam initiated at 3:18 PM.  Appropriate orders placed.  Rachel Stuart was informed that the remainder of the evaluation will be completed by another provider, this initial triage assessment does not replace that evaluation, and the importance of remaining in the ED until their evaluation is complete.     Shermon Warren SAILOR, PA-C 12/15/23 [id number]

## 2023-12-15 NOTE — ED Notes (Signed)
 Pt called no answer

## 2023-12-18 ENCOUNTER — Ambulatory Visit
Attending: Student in an Organized Health Care Education/Training Program | Admitting: Student in an Organized Health Care Education/Training Program

## 2023-12-18 ENCOUNTER — Encounter: Payer: Self-pay | Admitting: Student in an Organized Health Care Education/Training Program

## 2023-12-18 ENCOUNTER — Telehealth: Payer: Self-pay

## 2023-12-18 VITALS — BP 110/66 | HR 79 | Ht 63.0 in | Wt 218.4 lb

## 2023-12-18 DIAGNOSIS — R072 Precordial pain: Secondary | ICD-10-CM | POA: Insufficient documentation

## 2023-12-18 DIAGNOSIS — R0609 Other forms of dyspnea: Secondary | ICD-10-CM | POA: Diagnosis present

## 2023-12-18 NOTE — Patient Instructions (Signed)
 Medication Instructions:  No medication changes were made at this visit. Continue current regimen.   *If you need a refill on your cardiac medications before your next appointment, please call your pharmacy*  Lab Work: NONE If you have labs (blood work) drawn today and your tests are completely normal, you will receive your results only by: MyChart Message (if you have MyChart) OR A paper copy in the mail If you have any lab test that is abnormal or we need to change your treatment, we will call you to review the results.  Testing/Procedures: ECHOCARDIOGRAM Your physician has requested that you have an echocardiogram. Echocardiography is a painless test that uses sound waves to create images of your heart. It provides your doctor with information about the size and shape of your heart and how well your heart's chambers and valves are working. This procedure takes approximately one hour. There are no restrictions for this procedure. Please do NOT wear cologne, perfume, aftershave, or lotions (deodorant is allowed). Please arrive 15 minutes prior to your appointment time.  Please note: We ask at that you not bring children with you during ultrasound (echo/ vascular) testing. Due to room size and safety concerns, children are not allowed in the ultrasound rooms during exams. Our front office staff cannot provide observation of children in our lobby area while testing is being conducted. An adult accompanying a patient to their appointment will only be allowed in the ultrasound room at the discretion of the ultrasound technician under special circumstances. We apologize for any inconvenience.   Follow-Up: At [facility], you and your health needs are our priority.  As part of our continuing mission to provide you with exceptional heart care, our providers are all part of one team.  This team includes your primary Cardiologist (physician) and Advanced Practice Providers or APPs (Physician  Assistants and Nurse Practitioners) who all work together to provide you with the care you need, when you need it.  Your next appointment:   AS NEEDED  Provider:   Georganna Archer, MD    We recommend signing up for the patient portal called MyChart.  Sign up information is provided on this After Visit Summary.  MyChart is used to connect with patients for Virtual Visits (Telemedicine).  Patients are able to view lab/test results, encounter notes, upcoming appointments, etc.  Non-urgent messages can be sent to your provider as well.   To learn more about what you can do with MyChart, go to [email or web address].

## 2023-12-18 NOTE — Progress Notes (Signed)
 Cardiology Office Note:   Date:  12/18/2023  ID:  Rachel Stuart, DOB 03-10-1996, MRN [id number] PCP: Patient, No Pcp Per  [facility] Providers Cardiologist:  Georganna Archer, MD { Chief Complaint:  Chief Complaint  Patient presents with   Chest Pain      History of Present Illness:   Rachel Stuart is a 28 y.o. female with a PMH of obesity, bipolar, and depression who presents as a new patient referral by Norleen Essex for evaluation of elevated troponins.   The patient had an ED visit on 10/18/2023 for abdominal pain.  Testing during that visit revealed that she had an E. coli UTI.  Troponins were also obtained because she reported some chest discomfort and they were found to be mildly elevated at 39 -> 23.  Repeat ECGs were negative for ischemic changes.  She presented back to the ED on 12/15/2023 with complaints of chest pain and SOB, but left prior to full evaluation.  And initial troponin was negative.  She was referred to cardiology for evaluation.  Patient states that her chest pain for started back in July when she initially presented to the ED.  She describes it as being pressure/stabbing in quality, centrally located and radiates around her side to her back.  It is waxed and waned since July and occurs 2-3 times a week and can last for several hours.  It is not exertional and nothing makes it better or worse.  Additionally she reports having SOB during these episodes of chest discomfort as well as DOE.  This has been going on for roughly the same period of time.  Further endorses occasional dizziness.  She denies syncope, palpitations, PND, and orthopnea.  She gets occasional swelling in the RLE that she attributes to a prior leg fracture which was surgically repaired.  She currently vapes and uses marijuana.  No significant EtOH use.  Her family history is notable for a maternal aunt who has CAD.  She has no further complaints.  Past Medical History:  Diagnosis Date   Ankle  fracture    Anxiety    Depression    zoloft    Obesity    Ovarian cyst      Studies Reviewed:    EKG:  EKG Interpretation Date/Time:  Friday December 18 2023 14:50:44 EDT Ventricular Rate:  79 PR Interval:  146 QRS Duration:  82 QT Interval:  358 QTC Calculation: 410 R Axis:   54  Text Interpretation: Normal sinus rhythm with sinus arrhythmia Normal ECG When compared with ECG of 02-Dec-2023 20:43, T wave inversion no longer evident in Anterior leads Confirmed by Archer Georganna [phone]) on 12/18/2023 2:54:04 PM         Risk Assessment/Calculations:              Physical Exam:     VS:  BP 110/66   Pulse 79   Ht 5' 3 (1.6 m)   Wt 218 lb 6.4 oz (99.1 kg)   LMP  (LMP Unknown)   SpO2 97%   BMI 38.69 kg/m      Wt Readings from Last 3 Encounters:  12/15/23 217 lb (98.4 kg)  11/12/23 220 lb (99.8 kg)  03/11/23 220 lb (99.8 kg)     GEN: Well nourished, well developed, in no acute distress NECK: No JVD; No carotid bruits CARDIAC: RRR, no murmurs, rubs, gallops RESPIRATORY:  Clear to auscultation without rales, wheezing or rhonchi  ABDOMEN: Soft, non-tender, non-distended, normal bowel sounds EXTREMITIES:  Warm  and well perfused, no edema; No deformity, 2+ radial pulses PSYCH: Normal mood and affect   Assessment & Plan DOE (dyspnea on exertion)  Precordial pain Patient presenting with the complaint of intermittent chest pain and DOE.  She had mildly elevated troponins during ED visit back in July in the setting of an active UTI.  Repeat troponins have all been WNL since then.  I think the patient is very low risk for having obstructive CAD.  Additionally, her symptoms are not very suggestive of angina.  Her DOE is a bit concerning however, so I think is reasonable to get a complete echo to rule out any structural heart disease that could account for her DOE and/or her previously elevated troponins.  Otherwise, I would not favor doing an ischemic evaluation unless  there is an abnormality on echocardiogram. -Complete echo - Follow-up as needed      This note was written with the assistance of a dictation microphone or AI dictation software. Please excuse any typos or grammatical errors.   Signed, Georganna Archer, MD 12/18/2023 1:33 PM    [facility]

## 2023-12-18 NOTE — Telephone Encounter (Signed)
 The patient was notified that she could come to her appointment earlier if she wanted.  The patient said she would be to the office in 10 minutes.

## 2024-01-27 ENCOUNTER — Ambulatory Visit (HOSPITAL_COMMUNITY)
Admission: RE | Admit: 2024-01-27 | Discharge: 2024-01-27 | Disposition: A | Discharge status: Home / Self Care | Source: Ambulatory Visit | Attending: Cardiology | Admitting: Cardiology

## 2024-01-27 DIAGNOSIS — R0609 Other forms of dyspnea: Secondary | ICD-10-CM | POA: Diagnosis not present

## 2024-01-27 LAB — ECHOCARDIOGRAM COMPLETE
Area-P 1/2: 4.46 cm2
S' Lateral: 3.61 cm

## 2024-01-27 MED ORDER — PERFLUTREN LIPID MICROSPHERE
1.0000 mL | INTRAVENOUS | Status: AC | PRN
  Administered 2024-01-27: 2 mL via INTRAVENOUS

## 2024-01-28 ENCOUNTER — Ambulatory Visit: Payer: Self-pay | Admitting: Student in an Organized Health Care Education/Training Program
